# Patient Record
Sex: Female | Born: 1965 | Race: White | Hispanic: No | Smoking: Former smoker
Health system: Southern US, Community
[De-identification: ages and names within clinical notes are randomized; demographics above are authoritative.]

## PROBLEM LIST (undated history)

## (undated) DIAGNOSIS — J189 Pneumonia, unspecified organism: Secondary | ICD-10-CM

## (undated) DIAGNOSIS — F909 Attention-deficit hyperactivity disorder, unspecified type: Secondary | ICD-10-CM

## (undated) DIAGNOSIS — I739 Peripheral vascular disease, unspecified: Secondary | ICD-10-CM

## (undated) DIAGNOSIS — F319 Bipolar disorder, unspecified: Secondary | ICD-10-CM

## (undated) DIAGNOSIS — C801 Malignant (primary) neoplasm, unspecified: Secondary | ICD-10-CM

## (undated) DIAGNOSIS — I509 Heart failure, unspecified: Secondary | ICD-10-CM

## (undated) DIAGNOSIS — J45909 Unspecified asthma, uncomplicated: Secondary | ICD-10-CM

## (undated) DIAGNOSIS — K219 Gastro-esophageal reflux disease without esophagitis: Secondary | ICD-10-CM

## (undated) DIAGNOSIS — Z9981 Dependence on supplemental oxygen: Secondary | ICD-10-CM

## (undated) DIAGNOSIS — I10 Essential (primary) hypertension: Secondary | ICD-10-CM

## (undated) DIAGNOSIS — F32A Depression, unspecified: Secondary | ICD-10-CM

## (undated) DIAGNOSIS — M1990 Unspecified osteoarthritis, unspecified site: Secondary | ICD-10-CM

## (undated) DIAGNOSIS — I639 Cerebral infarction, unspecified: Secondary | ICD-10-CM

## (undated) DIAGNOSIS — R0600 Dyspnea, unspecified: Secondary | ICD-10-CM

## (undated) DIAGNOSIS — F419 Anxiety disorder, unspecified: Secondary | ICD-10-CM

## (undated) DIAGNOSIS — I2510 Atherosclerotic heart disease of native coronary artery without angina pectoris: Secondary | ICD-10-CM

## (undated) DIAGNOSIS — J449 Chronic obstructive pulmonary disease, unspecified: Secondary | ICD-10-CM

## (undated) HISTORY — PX: ROTATOR CUFF REPAIR: SHX139

## (undated) HISTORY — PX: CERVICAL ABLATION: SHX5771

## (undated) HISTORY — PX: BILATERAL CARPAL TUNNEL RELEASE: SHX6508

---

## 2017-04-05 ENCOUNTER — Ambulatory Visit (INDEPENDENT_AMBULATORY_CARE_PROVIDER_SITE_OTHER): Payer: Self-pay | Admitting: Otolaryngology

## 2017-04-05 DIAGNOSIS — R221 Localized swelling, mass and lump, neck: Secondary | ICD-10-CM

## 2017-04-05 DIAGNOSIS — D487 Neoplasm of uncertain behavior of other specified sites: Secondary | ICD-10-CM

## 2017-04-06 ENCOUNTER — Other Ambulatory Visit (INDEPENDENT_AMBULATORY_CARE_PROVIDER_SITE_OTHER): Payer: Self-pay | Admitting: Otolaryngology

## 2017-04-06 DIAGNOSIS — R221 Localized swelling, mass and lump, neck: Secondary | ICD-10-CM

## 2017-04-19 ENCOUNTER — Ambulatory Visit (HOSPITAL_COMMUNITY)
Admission: RE | Admit: 2017-04-19 | Discharge: 2017-04-19 | Disposition: A | Discharge status: Home / Self Care | Payer: Self-pay | Source: Ambulatory Visit | Attending: Otolaryngology | Admitting: Otolaryngology

## 2017-04-19 ENCOUNTER — Encounter (HOSPITAL_COMMUNITY): Payer: Self-pay | Admitting: Radiology

## 2017-04-19 DIAGNOSIS — R590 Localized enlarged lymph nodes: Secondary | ICD-10-CM | POA: Insufficient documentation

## 2017-04-19 DIAGNOSIS — M799 Soft tissue disorder, unspecified: Secondary | ICD-10-CM | POA: Insufficient documentation

## 2017-04-19 DIAGNOSIS — R221 Localized swelling, mass and lump, neck: Secondary | ICD-10-CM

## 2017-04-19 DIAGNOSIS — J432 Centrilobular emphysema: Secondary | ICD-10-CM | POA: Insufficient documentation

## 2017-04-19 MED ORDER — IOPAMIDOL (ISOVUE-300) INJECTION 61%
75.0000 mL | Freq: Once | INTRAVENOUS | Status: AC | PRN
  Administered 2017-04-19: 75 mL via INTRAVENOUS

## 2017-04-27 ENCOUNTER — Other Ambulatory Visit (INDEPENDENT_AMBULATORY_CARE_PROVIDER_SITE_OTHER): Payer: Self-pay | Admitting: Otolaryngology

## 2017-04-27 DIAGNOSIS — R590 Localized enlarged lymph nodes: Secondary | ICD-10-CM

## 2017-05-02 ENCOUNTER — Other Ambulatory Visit: Payer: Self-pay | Admitting: Radiology

## 2017-05-03 ENCOUNTER — Ambulatory Visit (HOSPITAL_COMMUNITY)
Admission: RE | Admit: 2017-05-03 | Discharge: 2017-05-03 | Disposition: A | Discharge status: Home / Self Care | Payer: Self-pay | Source: Ambulatory Visit | Attending: Otolaryngology | Admitting: Otolaryngology

## 2017-05-03 DIAGNOSIS — R590 Localized enlarged lymph nodes: Secondary | ICD-10-CM | POA: Insufficient documentation

## 2017-05-03 DIAGNOSIS — J439 Emphysema, unspecified: Secondary | ICD-10-CM | POA: Insufficient documentation

## 2017-05-03 DIAGNOSIS — Z880 Allergy status to penicillin: Secondary | ICD-10-CM | POA: Insufficient documentation

## 2017-05-03 DIAGNOSIS — Z888 Allergy status to other drugs, medicaments and biological substances status: Secondary | ICD-10-CM | POA: Insufficient documentation

## 2017-05-03 DIAGNOSIS — Z8541 Personal history of malignant neoplasm of cervix uteri: Secondary | ICD-10-CM | POA: Insufficient documentation

## 2017-05-03 DIAGNOSIS — F17200 Nicotine dependence, unspecified, uncomplicated: Secondary | ICD-10-CM | POA: Insufficient documentation

## 2017-05-03 LAB — CBC
HEMATOCRIT: 46.3 % — AB (ref 36.0–46.0)
HEMOGLOBIN: 16.8 g/dL — AB (ref 12.0–15.0)
MCH: 33.1 pg (ref 26.0–34.0)
MCHC: 36.3 g/dL — AB (ref 30.0–36.0)
MCV: 91.1 fL (ref 78.0–100.0)
Platelets: 207 10*3/uL (ref 150–400)
RBC: 5.08 MIL/uL (ref 3.87–5.11)
RDW: 13 % (ref 11.5–15.5)
WBC: 5.9 10*3/uL (ref 4.0–10.5)

## 2017-05-03 LAB — PROTIME-INR
INR: 0.91
Prothrombin Time: 12.2 seconds (ref 11.4–15.2)

## 2017-05-03 MED ORDER — LIDOCAINE HCL (PF) 1 % IJ SOLN
INTRAMUSCULAR | Status: AC
  Filled 2017-05-03: qty 10

## 2017-05-03 MED ORDER — MIDAZOLAM HCL 2 MG/2ML IJ SOLN
INTRAMUSCULAR | Status: AC
  Filled 2017-05-03: qty 4

## 2017-05-03 MED ORDER — SODIUM CHLORIDE 0.9 % IV SOLN
INTRAVENOUS | Status: AC | PRN
  Administered 2017-05-03: 10 mL/h via INTRAVENOUS

## 2017-05-03 MED ORDER — FENTANYL CITRATE (PF) 100 MCG/2ML IJ SOLN
INTRAMUSCULAR | Status: AC
  Filled 2017-05-03: qty 4

## 2017-05-03 MED ORDER — SODIUM CHLORIDE 0.9 % IV SOLN: INTRAVENOUS | Status: DC

## 2017-05-03 NOTE — Discharge Instructions (Signed)

## 2017-05-03 NOTE — Sedation Documentation (Signed)
Pt heart rate showing ventricular bigeminy. PA and Dr Anselm Pancoast elected to proceed w/o sedation. Writer will continue to monitor closely.

## 2017-05-03 NOTE — Progress Notes (Signed)
Phone call to pt regarding care instructions for her biopsy site. Pt verbalized understanding of instructions.

## 2017-05-03 NOTE — Procedures (Addendum)
US guided core biopsies and FNAs from left upper neck lesion/lymph nodes.  Lesion is poorly defined and there appears to be necrotic nodes in this area.  Minimal blood loss and no immediate complication.     Patient continued to have occasional PVCs during procedure but she is completely asymptomatic.  Patient's PCP was notified and follow-up appointment has been scheduled.

## 2017-05-03 NOTE — Progress Notes (Signed)
At present, cardiac monitoring showing no current PVCs.  She remains asymptomatic and feels stable for discharge home.  After discussion with Dr. Anselm Pancoast and Dr. Bartolo Darter EKG cancelled.   Patient will follow-up with PCP on Monday.   Brynda Greathouse, MS RD PA-C 4:08 PM

## 2017-05-03 NOTE — Sedation Documentation (Addendum)
Addendum to previous note/ per physician, pt d/c from [place]. Brother in waiting area, home with family. Writer wheeled pt to main entrance in chair.

## 2017-05-03 NOTE — Progress Notes (Signed)
Called PCP office- [facility]. Spoke with Dr. Bartolo Darter who confirms patient without known history of irregular rhythm.  Made appointment for patient to be evaluated at their office Monday 05/07/17 at 1130.   Patient informed.   Brynda Greathouse, MS RD PA-C 3:37 PM

## 2017-05-03 NOTE — H&P (Signed)
Chief Complaint: Patient was seen in consultation today for lymphadenopathy  Referring Physician(s): Teoh,Su  Supervising Physician: Markus Daft  Patient Status: [facility] - Out-pt  History of Present Illness: Stephanie Ross is a 52 y.o. female with remote history of cervical cancer, emphysema, and current smoker who presents with neck mass.   CT Soft Tissue Neck 04/19/17 showed: 1. The dominant and most superficial soft tissue mass corresponding to the palpable area of concern is a partially cystic lesion measuring up to 20 mm long axis which is inseparable from the anterior inferior left parotid space, but also located near the left level IIa nodal station, and abutting the lateral left submandibular gland. There is regional soft tissue stranding suggesting a degree of associated inflammation. There are multiple regional abnormal lymph nodes, which are asymmetrically enlarged and indistinct with spiculated margins. The largest are 12 millimeters short axis. There is no laryngeal or pharyngeal primary lesion identified. 2. The somewhat wide ranging regional lymph node involvement does raise the possibility of an infection, but overall the constellation is more suspicious for malignancy. Top differential considerations include a primary salivary gland carcinoma arising along the margin of the parotid space, metastatic lymphadenopathy from a regional skin cancer or an undetected pharyngeal primary. Histologic correlation will be necessary and the abnormality might be amenable to imaging guided needle biopsy. 3.  Emphysema (ICD10-J43.9).  IR consulted for lymph node biopsy at the request of Dr. Benjamine Mola.  Case reviewed by Dr. Pascal Lux who approves patient for procedure.  She presents today in her usual state of health. She does complain of ongoing unintentional weight loss, occasional nausea/vomiting, and shoulder pain related to rotator cuff injury.  She has been NPO.  She does not  take blood thinners.   Allergies: Amoxicillin and Prednisone  Medications: Prior to Admission medications   Medication Sig Start Date End Date Taking? Authorizing Provider  acetaminophen (TYLENOL) 500 MG tablet Take 1,000-1,500 mg by mouth every 6 (six) hours as needed for mild pain or moderate pain.   Yes [provider]  budesonide-formoterol (SYMBICORT) 160-4.5 MCG/ACT inhaler Inhale 2 puffs into the lungs 2 (two) times daily.   Yes [provider]  ibuprofen (ADVIL,MOTRIN) 200 MG tablet Take 800 mg by mouth every 6 (six) hours as needed for mild pain or moderate pain.   Yes [provider]  oxyCODONE-acetaminophen (PERCOCET/ROXICET) 5-325 MG tablet Take 1 tablet by mouth every 6 (six) hours.   Yes [provider]     No family history on file.  Social History   Socioeconomic History  . Marital status: Divorced    Spouse name: Not on file  . Number of children: Not on file  . Years of education: Not on file  . Highest education level: Not on file  Social Needs  . Financial resource strain: Not on file  . Food insecurity - worry: Not on file  . Food insecurity - inability: Not on file  . Transportation needs - medical: Not on file  . Transportation needs - non-medical: Not on file  Occupational History  . Not on file  Tobacco Use  . Smoking status: Not on file  Substance and Sexual Activity  . Alcohol use: Not on file  . Drug use: Not on file  . Sexual activity: Not on file  Other Topics Concern  . Not on file  Social History Narrative  . Not on file    Review of Systems  Constitutional: Negative for fatigue and fever.  Respiratory: Positive for cough (chronic). Negative for shortness of breath.   Cardiovascular: Negative for chest pain.  Gastrointestinal: Positive for nausea and vomiting (several times in past few months). Negative for abdominal pain.  Musculoskeletal: Negative for back pain.  Psychiatric/Behavioral: Negative  for behavioral problems and confusion.    Vital Signs: BP 119/85   Pulse 93   Temp 97.6 F (36.4 C) (Oral)   Ht 5\' 2"  (1.575 m)   Wt 138 lb (62.6 kg)   SpO2 100%   BMI 25.24 kg/m   Physical Exam  Constitutional: She appears well-developed.  Neck: Normal range of motion.  Cardiovascular: Normal rate, regular rhythm and normal heart sounds.  Pulmonary/Chest: Effort normal. No respiratory distress.  Coarse breath sounds  Abdominal: Soft. Bowel sounds are normal.  Skin: Skin is warm and dry.  Psychiatric: She has a normal mood and affect. Her behavior is normal. Judgment and thought content normal.  Nursing note and vitals reviewed.   Imaging: Ct Soft Tissue Neck W Contrast  Result Date: 04/19/2017 CLINICAL DATA:  52 year old female with 2 painful palpable areas along the side of the neck 1st discovered 2 months ago. Remote history of cervical cancer (1990s). EXAM: CT NECK WITH CONTRAST TECHNIQUE: Multidetector CT imaging of the neck was performed using the standard protocol following the bolus administration of intravenous contrast. CONTRAST:  49mL ISOVUE-300 IOPAMIDOL (ISOVUE-300) INJECTION 61% COMPARISON:  None. FINDINGS: Pharynx and larynx: The glottis is closed. Laryngeal soft tissue contours appear within normal limits. Pharyngeal soft tissue contours are within normal limits. No suspicious pharyngeal or laryngeal hyperenhancement identified. Parapharyngeal and retropharyngeal spaces appear normal. Salivary glands: Negative sublingual space. The right submandibular glands and parotid glands appear normal. Situated between the anterior inferior tip of the right parotid space and the posterolateral margin of the right submandibular gland is a partially cystic or necrotic lobulated soft tissue mass corresponding to the marked area of palpable concern. This encompasses 20 x 16 x 17 millimeters (AP by transverse by CC) but has indistinct margins. It is unclear whether this is arising from  the left parotid gland, but it is inseparable from the anterior left parotid space. The lesion appears more separate from the submandibular gland. There are multiple surrounding asymmetrically enlarged lymph nodes, detailed below. There is mild associated inflammatory appearing thickening of the left platysma in the submandibular region. The remainder of the left parotid space and left parotid gland are normal. The left submandibular gland appears mildly lobulated but with no abnormal enhancement. The left stylomastoid foramen has a normal CT appearance. Thyroid: Negative. Lymph nodes: Multiple enlarged and indistinct lymph nodes at the left level 1B, left level IIa, and level IIIa nodal stations. These nodes individually measure up to 12 millimeters short axis, and are clustered about the partially cystic palpable mass described above. See series 2 images 42 through 45. There are even small but asymmetrically enlarged left level 2B versus level 5 nodes on series 2, image 22 and 32, which have a similar indistinct appearance. These are up to 5 millimeters individually. The contralateral right side cervical nodes are within normal limits. The largest right level IIa nodes are 7 millimeters short axis. Vascular: Major vascular structures in the neck and at the skull base are patent, including the left IJ. Limited intracranial: Negative. Visualized orbits: Negative. Mastoids and visualized paranasal sinuses: Clear aside from mild bubbly opacity in the non dominant right sphenoid sinus. Skeleton: Intact mandible. No acute osseous abnormality identified. Chronic C5-C6 disc and endplate degeneration.  Upper chest: Centrilobular emphysema. No upper lobe lung nodule. Negative visualized superior mediastinum. Negative visible axillary lymph nodes. IMPRESSION: 1. The dominant and most superficial soft tissue mass corresponding to the palpable area of concern is a partially cystic lesion measuring up to 20 mm long axis which is  inseparable from the anterior inferior left parotid space, but also located near the left level IIa nodal station, and abutting the lateral left submandibular gland. There is regional soft tissue stranding suggesting a degree of associated inflammation. There are multiple regional abnormal lymph nodes, which are asymmetrically enlarged and indistinct with spiculated margins. The largest are 12 millimeters short axis. There is no laryngeal or pharyngeal primary lesion identified. 2. The somewhat wide ranging regional lymph node involvement does raise the possibility of an infection, but overall the constellation is more suspicious for malignancy. Top differential considerations include a primary salivary gland carcinoma arising along the margin of the parotid space, metastatic lymphadenopathy from a regional skin cancer or an undetected pharyngeal primary. Histologic correlation will be necessary and the abnormality might be amenable to imaging guided needle biopsy. 3.  Emphysema (ICD10-J43.9). Electronically Signed   By: Genevie Ann M.D.   On: 04/19/2017 14:52    Labs:  CBC: Recent Labs    05/03/17 1137  WBC 5.9  HGB 16.8*  HCT 46.3*  PLT 207    COAGS: Recent Labs    05/03/17 1137  INR 0.91    BMP: No results for input(s): NA, K, CL, CO2, GLUCOSE, BUN, CALCIUM, CREATININE, GFRNONAA, GFRAA in the last 8760 hours.  Invalid input(s): CMP  LIVER FUNCTION TESTS: No results for input(s): BILITOT, AST, ALT, ALKPHOS, PROT, ALBUMIN in the last 8760 hours.  TUMOR MARKERS: No results for input(s): AFPTM, CEA, CA199, CHROMGRNA in the last 8760 hours.  Assessment and Plan: Patient with past medical history of empysema, remote cervical cancer presents with complaint of cervical lymphadenopathy.  IR consulted for lymph node biopsy at the request of Dr. Benjamine Mola. Case reviewed by Dr. Anselm Pancoast who approves patient for procedure.  Patient presents today in their usual state of health.  She has been NPO and  is not currently on blood thinners.  Risks and benefits discussed with the patient including, but not limited to bleeding, infection, damage to adjacent structures or low yield requiring additional tests.  All of the patient's questions were answered, patient is agreeable to proceed. Consent signed and in chart.  Thank you for this interesting consult.  I greatly enjoyed meeting Stephanie Ross and look forward to participating in their care.  A copy of this report was sent to the requesting provider on this date.  Electronically Signed: Docia Barrier, PA 05/03/2017, 12:38 PM   I spent a total of  30 Minutes   in face to face in clinical consultation, greater than 50% of which was counseling/coordinating care for lymphadenopathy

## 2017-05-07 ENCOUNTER — Emergency Department (HOSPITAL_COMMUNITY)
Admission: EM | Admit: 2017-05-07 | Discharge: 2017-05-07 | Disposition: A | Discharge status: Home / Self Care | Payer: Self-pay | Attending: Emergency Medicine | Admitting: Emergency Medicine

## 2017-05-07 ENCOUNTER — Emergency Department (HOSPITAL_COMMUNITY): Payer: Self-pay

## 2017-05-07 ENCOUNTER — Encounter (HOSPITAL_COMMUNITY): Payer: Self-pay | Admitting: *Deleted

## 2017-05-07 ENCOUNTER — Other Ambulatory Visit: Payer: Self-pay

## 2017-05-07 DIAGNOSIS — J690 Pneumonitis due to inhalation of food and vomit: Secondary | ICD-10-CM | POA: Insufficient documentation

## 2017-05-07 DIAGNOSIS — F17210 Nicotine dependence, cigarettes, uncomplicated: Secondary | ICD-10-CM | POA: Insufficient documentation

## 2017-05-07 DIAGNOSIS — I10 Essential (primary) hypertension: Secondary | ICD-10-CM | POA: Insufficient documentation

## 2017-05-07 DIAGNOSIS — Z859 Personal history of malignant neoplasm, unspecified: Secondary | ICD-10-CM | POA: Insufficient documentation

## 2017-05-07 DIAGNOSIS — Z79899 Other long term (current) drug therapy: Secondary | ICD-10-CM | POA: Insufficient documentation

## 2017-05-07 DIAGNOSIS — J441 Chronic obstructive pulmonary disease with (acute) exacerbation: Secondary | ICD-10-CM

## 2017-05-07 HISTORY — DX: Malignant (primary) neoplasm, unspecified: C80.1

## 2017-05-07 HISTORY — DX: Essential (primary) hypertension: I10

## 2017-05-07 HISTORY — DX: Unspecified asthma, uncomplicated: J45.909

## 2017-05-07 LAB — BASIC METABOLIC PANEL
Anion gap: 11 (ref 5–15)
BUN: 12 mg/dL (ref 6–20)
CHLORIDE: 96 mmol/L — AB (ref 101–111)
CO2: 29 mmol/L (ref 22–32)
Calcium: 9.3 mg/dL (ref 8.9–10.3)
Creatinine, Ser: 0.81 mg/dL (ref 0.44–1.00)
GFR calc Af Amer: >60 mL/min (ref >60)
GFR calc non Af Amer: >60 mL/min (ref >60)
GLUCOSE: 119 mg/dL — AB (ref 65–99)
POTASSIUM: 3.7 mmol/L (ref 3.5–5.1)
Sodium: 136 mmol/L (ref 135–145)

## 2017-05-07 LAB — CBC WITH DIFFERENTIAL/PLATELET
Basophils Absolute: 0 10*3/uL (ref 0.0–0.1)
Basophils Relative: 0 %
Eosinophils Absolute: 0.1 10*3/uL (ref 0.0–0.7)
Eosinophils Relative: 1 %
HCT: 48 % — ABNORMAL HIGH (ref 36.0–46.0)
HEMOGLOBIN: 16.8 g/dL — AB (ref 12.0–15.0)
LYMPHS ABS: 2 10*3/uL (ref 0.7–4.0)
LYMPHS PCT: 39 %
MCH: 32.2 pg (ref 26.0–34.0)
MCHC: 35 g/dL (ref 30.0–36.0)
MCV: 92.1 fL (ref 78.0–100.0)
Monocytes Absolute: 0.8 10*3/uL (ref 0.1–1.0)
Monocytes Relative: 14 %
NEUTROS PCT: 46 %
Neutro Abs: 2.4 10*3/uL (ref 1.7–7.7)
Platelets: 201 10*3/uL (ref 150–400)
RBC: 5.21 MIL/uL — AB (ref 3.87–5.11)
RDW: 12.7 % (ref 11.5–15.5)
WBC: 5.3 10*3/uL (ref 4.0–10.5)

## 2017-05-07 LAB — BRAIN NATRIURETIC PEPTIDE: B Natriuretic Peptide: 123 pg/mL — ABNORMAL HIGH (ref 0.0–100.0)

## 2017-05-07 LAB — TROPONIN I

## 2017-05-07 LAB — D-DIMER, QUANTITATIVE: D-Dimer, Quant: 0.54 ug/mL-FEU — ABNORMAL HIGH (ref 0.00–0.50)

## 2017-05-07 MED ORDER — DOXYCYCLINE HYCLATE 100 MG PO CAPS: 100.0000 mg | ORAL_CAPSULE | Freq: Two times a day (BID) | ORAL | 0 refills | Status: DC

## 2017-05-07 MED ORDER — IOPAMIDOL (ISOVUE-370) INJECTION 76%
100.0000 mL | Freq: Once | INTRAVENOUS | Status: AC | PRN
  Administered 2017-05-07: 100 mL via INTRAVENOUS

## 2017-05-07 MED ORDER — ALBUTEROL SULFATE HFA 108 (90 BASE) MCG/ACT IN AERS: 2.0000 | INHALATION_SPRAY | RESPIRATORY_TRACT | 3 refills | Status: DC | PRN

## 2017-05-07 MED ORDER — ALBUTEROL SULFATE (2.5 MG/3ML) 0.083% IN NEBU: 5.0000 mg | INHALATION_SOLUTION | Freq: Once | RESPIRATORY_TRACT | Status: DC

## 2017-05-07 NOTE — Discharge Instructions (Signed)
Your CAT scan shows that you have some fluid in 1 of your airways.  Please take doxycycline twice a day for 10 days as well as albuterol every 4 hours    ER for worsening symptoms including shortness of breath, cough, fevers or chest pain  NO BLOOD CLOTS!

## 2017-05-07 NOTE — ED Notes (Signed)
Patient given discharge instruction, verbalized understand. Patient ambulatory out of the department.  

## 2017-05-07 NOTE — ED Provider Notes (Signed)
[facility] EMERGENCY DEPARTMENT Provider Note   CSN: [id number] Arrival date & time: 05/07/17  1355     History   Chief Complaint Chief Complaint  Patient presents with  . Shortness of Breath    HPI Stephanie Ross is a 52 y.o. female.  HPI  The patient is a 52 year old female, she has a known history of asthma, high blood pressure, who has had several months of worsening swelling on the left side of her jaw just inferior to the angle of the mandible.  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  A biopsy was performed 4 days ago and the results of that are not yet known.  The patient does report chronic asthma and as such has had some chronic shortness of breath but over the last week she has had increasing shortness of breath.  She reports that this is associated with wheezing, she does not feel like it is her neck that is making her short of breath but her chest.  She was seen at her family doctor's office today and they sent her to the emergency department with concern for pulmonary embolism.  Her shortness of breath is persistent, it is not worse with position or exertion, it is not associated with fevers objectively though she has had some subjective fevers, it is not associated with swelling of the legs.  She has never had a blood clot in the past, she has never had a heart attack, she has been using her albuterol inhalers with some improvement at home.  Past Medical History:  Diagnosis Date  . Asthma   . Cancer ([place])   . Hypertension     There are no active problems to display for this patient.   Past Surgical History:  Procedure Laterality Date  . BILATERAL CARPAL TUNNEL RELEASE    . CERVICAL ABLATION      OB History    No data available       Home Medications    Prior to Admission medications     Medication Sig Start Date End Date Taking? Authorizing Provider  acetaminophen (TYLENOL) 500 MG tablet Take 1,000-1,500 mg by mouth every 6 (six) hours as needed for mild pain or moderate pain.    [provider]  albuterol (PROVENTIL HFA;VENTOLIN HFA) 108 (90 Base) MCG/ACT inhaler Inhale 2 puffs into the lungs every 4 (four) hours as needed for wheezing or shortness of breath. 05/07/17   Noemi Chapel, MD  budesonide-formoterol [facility]) 160-4.5 MCG/ACT inhaler Inhale 2 puffs into the lungs 2 (two) times daily.    [provider]  doxycycline (VIBRAMYCIN) 100 MG capsule Take 1 capsule (100 mg total) by mouth 2 (two) times daily. 05/07/17   Noemi Chapel, MD  ibuprofen (ADVIL,MOTRIN) 200 MG tablet Take 800 mg by mouth every 6 (six) hours as needed for mild pain or moderate pain.    [provider]  oxyCODONE-acetaminophen (PERCOCET/ROXICET) 5-325 MG tablet Take 1 tablet by mouth every 6 (six) hours.    [provider]    Family History No family history on file.  Social History Social History   Tobacco Use  . Smoking status: Current Every Day Smoker    Packs/day: 0.50    Types: Cigarettes  . Smokeless tobacco: Never Used  Substance Use Topics  . Alcohol use: No    Frequency:  Never  . Drug use: No     Allergies   Amoxicillin and Prednisone   Review of Systems Review of Systems  All other systems reviewed and are negative.    Physical Exam Updated Vital Signs BP 128/68   Pulse 73   Temp 98 F (36.7 C) (Oral)   Resp 17   Ht 5\' 2"  (1.575 m)   Wt 59.4 kg (131 lb)   SpO2 95%   BMI 23.96 kg/m   Physical Exam  Constitutional: She appears well-developed and well-nourished. No distress.  HENT:  Head: Normocephalic and atraumatic.  Mouth/Throat: Oropharynx is clear and moist. No oropharyngeal exudate.  The patient is able to open and close her mouth, there is some tenderness at the left angle of the mandible with this movement and  there is a mass palpated in that region, it is tender to the touch but there is no overlying redness or warmth to the skin.  Her oral cavity is clear without any signs of asymmetry, no change in her phonation  Eyes: Conjunctivae and EOM are normal. Pupils are equal, round, and reactive to light. Right eye exhibits no discharge. Left eye exhibits no discharge. No scleral icterus.  Neck: Normal range of motion. Neck supple. No JVD present. No thyromegaly present.  The trachea is midline in the patient's neck is supple without trismus or torticollis  Cardiovascular: Normal rate, regular rhythm, normal heart sounds and intact distal pulses. Exam reveals no gallop and no friction rub.  No murmur heard. Pulmonary/Chest: Effort normal. No respiratory distress. She has wheezes ( Nonlabored breathing but has wheezes on expiration with a slight prolonged expiratory phase). She has no rales.  Abdominal: Soft. Bowel sounds are normal. She exhibits no distension and no mass. There is no tenderness.  Musculoskeletal: Normal range of motion. She exhibits no edema or tenderness.  Lymphadenopathy:    She has no cervical adenopathy.  Neurological: She is alert. Coordination normal.  Skin: Skin is warm and dry. No rash noted. No erythema.  Psychiatric: She has a normal mood and affect. Her behavior is normal.  Nursing note and vitals reviewed.    ED Treatments / Results  Labs (all labs ordered are listed, but only abnormal results are displayed) Labs Reviewed  CBC WITH DIFFERENTIAL/PLATELET - Abnormal; Notable for the following components:      Result Value   RBC 5.21 (*)    Hemoglobin 16.8 (*)    HCT 48.0 (*)    All other components within normal limits  BASIC METABOLIC PANEL - Abnormal; Notable for the following components:   Chloride 96 (*)    Glucose, Bld 119 (*)    All other components within normal limits  BRAIN NATRIURETIC PEPTIDE - Abnormal; Notable for the following components:   B Natriuretic  Peptide 123.0 (*)    All other components within normal limits  D-DIMER, QUANTITATIVE (NOT AT [facility]) - Abnormal; Notable for the following components:   D-Dimer, Quant 0.54 (*)    All other components within normal limits  TROPONIN I    EKG  EKG Interpretation  Date/Time:  Monday May 07 2017 14:00:12 EST Ventricular Rate:  83 PR Interval:  148 QRS Duration: 104 QT Interval:  394 QTC Calculation: 462 R Axis:   80 Text Interpretation:  Sinus rhythm with frequent Premature ventricular complexes Biatrial enlargement ST & T wave abnormality, consider inferior ischemia Prolonged QT Abnormal ECG PVC's, Non-specific ST-t changes No old tracing to compare Confirmed by Noemi Chapel [phone])  on 05/07/2017 3:22:28 PM       Radiology Dg Chest 2 View  Result Date: 05/07/2017 CLINICAL DATA:  52 year old female with a history of shortness of breath EXAM: CHEST  2 VIEW COMPARISON:  None. FINDINGS: Cardiomediastinal silhouette within normal limits. No evidence of central vascular congestion. No pneumothorax or pleural effusion. No confluent airspace disease. Coarsened interstitial markings on the anterior view. No displaced fracture. Degenerative changes of the acromioclavicular joints IMPRESSION: Likely chronic changes with no evidence of superimposed acute cardiopulmonary disease Electronically Signed   By: Corrie Mckusick D.O.   On: 05/07/2017 14:19   Ct Angio Chest Pe W And/or Wo Contrast  Result Date: 05/07/2017 CLINICAL DATA:  52 year old female with 2 weeks of shortness of breath. Cough. Smoker. EXAM: CT ANGIOGRAPHY CHEST WITH CONTRAST TECHNIQUE: Multidetector CT imaging of the chest was performed using the standard protocol during bolus administration of intravenous contrast. Multiplanar CT image reconstructions and MIPs were obtained to evaluate the vascular anatomy. CONTRAST:  169mL ISOVUE-370 IOPAMIDOL (ISOVUE-370) INJECTION 76% COMPARISON:  Chest radiographs 1401 hr today.  Neck CT  04/19/2017 FINDINGS: Cardiovascular: Good contrast bolus timing in the pulmonary arterial tree. No focal filling defect identified in the pulmonary arteries to suggest acute pulmonary embolism. Calcified left coronary artery atherosclerosis suspected on series 6, image 143. Soft and calcified plaque of the visible aorta, most pronounced in the descending and visible upper abdominal aorta. No cardiomegaly or pericardial effusion. Mediastinum/Nodes: Visible thoracic inlet is within normal limits. No mediastinal or hilar lymphadenopathy. Lungs/Pleura: Centrilobular emphysema. Small volume layering fluid in the right mainstem bronchus (series 7, image 73). Mild central peribronchial thickening. Occasional mild subpleural soft tissue opacity and nodularity (such as along the lateral left major fissure in the lingula and in the right posterior costophrenic angle on series 7, image 126) most compatible with scarring. No pleural effusion or other abnormal pulmonary opacity. Upper Abdomen: Negative visible liver, spleen, pancreas, adrenal glands, kidneys, and bowel in the upper abdomen. Musculoskeletal: No acute osseous abnormality identified. Review of the MIP images confirms the above findings. IMPRESSION: 1.  Negative for acute pulmonary embolus. 2. Emphysema (ICD10-J43.9). Small volume of fluid / secretions in the right mainstem bronchus, but no acute pulmonary process identified. 3. Aortic Atherosclerosis (ICD10-I70.0). Calcified coronary artery atherosclerosis. Electronically Signed   By: Genevie Ann M.D.   On: 05/07/2017 18:59    Procedures Procedures (including critical care time)  Medications Ordered in ED Medications  albuterol (PROVENTIL) (2.5 MG/3ML) 0.083% nebulizer solution 5 mg (not administered)  iopamidol (ISOVUE-370) 76 % injection 100 mL (100 mLs Intravenous Contrast Given 05/07/17 1838)     Initial Impression / Assessment and Plan / ED Course  I have reviewed the triage vital signs and the  nursing notes.  Pertinent labs & imaging results that were available during my care of the patient were reviewed by me and considered in my medical decision making (see chart for details).  Clinical Course as of May 07 1904  Mon May 07, 2017  1522 2 view PA and lateral chest reviewed, the patient is a smoker, she has lungs consistent with COPD, no acute infiltrates or pneumothorax.  [BM]    Clinical Course User Index [BM] Noemi Chapel, MD    I do not see any signs of acute infiltrate on the x-ray, I do not see any signs of PE including no tachycardia or hypoxia, she is wheezing all pointing to a reactive airway disease however given her recent history of possible malignancy with  a progressive shortness of breath will need to rule out pulmonary embolism.  D-dimer ordered.  CT scan does show some fluid in 1 of the mainstem bronchi, Patient will be treated with asthma treatments protocols or COPD protocol, will also give doxycycline, patient stable for discharge, expressed understanding  Final Clinical Impressions(s) / ED Diagnoses   Final diagnoses:  Aspiration pneumonitis ([place])  COPD exacerbation [facility])    ED Discharge Orders        Ordered    doxycycline (VIBRAMYCIN) 100 MG capsule  2 times daily     05/07/17 1905    albuterol (PROVENTIL HFA;VENTOLIN HFA) 108 (90 Base) MCG/ACT inhaler  Every 4 hours PRN     05/07/17 1905       Noemi Chapel, MD 05/07/17 1907

## 2017-05-07 NOTE — ED Triage Notes (Signed)
Patient came in per pcp, due to "irregular heart rate" for "6 hours", from neck biopsy done at [facility] on Thursday.  Feel short of breath constantly.

## 2017-05-31 ENCOUNTER — Ambulatory Visit (INDEPENDENT_AMBULATORY_CARE_PROVIDER_SITE_OTHER): Payer: Self-pay | Admitting: Otolaryngology

## 2017-05-31 DIAGNOSIS — R590 Localized enlarged lymph nodes: Secondary | ICD-10-CM

## 2017-05-31 DIAGNOSIS — D487 Neoplasm of uncertain behavior of other specified sites: Secondary | ICD-10-CM

## 2017-06-25 ENCOUNTER — Ambulatory Visit (INDEPENDENT_AMBULATORY_CARE_PROVIDER_SITE_OTHER): Payer: Self-pay | Admitting: Otolaryngology

## 2017-06-25 DIAGNOSIS — K1121 Acute sialoadenitis: Secondary | ICD-10-CM

## 2018-03-06 ENCOUNTER — Other Ambulatory Visit (HOSPITAL_COMMUNITY): Payer: Self-pay | Admitting: Respiratory Therapy

## 2018-03-06 DIAGNOSIS — J441 Chronic obstructive pulmonary disease with (acute) exacerbation: Secondary | ICD-10-CM

## 2018-04-03 ENCOUNTER — Ambulatory Visit (HOSPITAL_COMMUNITY)
Admission: RE | Admit: 2018-04-03 | Discharge: 2018-04-03 | Disposition: A | Discharge status: Home / Self Care | Payer: Medicaid Other | Source: Ambulatory Visit | Attending: Pulmonary Disease | Admitting: Pulmonary Disease

## 2018-04-03 DIAGNOSIS — J441 Chronic obstructive pulmonary disease with (acute) exacerbation: Secondary | ICD-10-CM

## 2018-04-03 LAB — PULMONARY FUNCTION TEST
DL/VA % PRED: 53 %
DL/VA: 2.47 ml/min/mmHg/L
DLCO UNC % PRED: 38 %
DLCO unc: 8.51 ml/min/mmHg
FEF 25-75 POST: 0.45 L/s
FEF 25-75 PRE: 0.33 L/s
FEF2575-%CHANGE-POST: 36 %
FEF2575-%PRED-POST: 17 %
FEF2575-%PRED-PRE: 13 %
FEV1-%Change-Post: 8 %
FEV1-%Pred-Post: 33 %
FEV1-%Pred-Pre: 30 %
FEV1-Post: 0.86 L
FEV1-Pre: 0.79 L
FEV1FVC-%CHANGE-POST: -9 %
FEV1FVC-%Pred-Pre: 65 %
FEV6-%CHANGE-POST: 17 %
FEV6-%Pred-Post: 53 %
FEV6-%Pred-Pre: 45 %
FEV6-PRE: 1.44 L
FEV6-Post: 1.69 L
FEV6FVC-%Change-Post: -2 %
FEV6FVC-%PRED-PRE: 99 %
FEV6FVC-%Pred-Post: 96 %
FVC-%CHANGE-POST: 20 %
FVC-%PRED-POST: 55 %
FVC-%Pred-Pre: 46 %
FVC-Post: 1.81 L
FVC-Pre: 1.51 L
POST FEV1/FVC RATIO: 47 %
PRE FEV1/FVC RATIO: 52 %
Post FEV6/FVC ratio: 93 %
Pre FEV6/FVC Ratio: 96 %
RV % pred: 205 %
RV: 3.69 L
TLC % pred: 113 %
TLC: 5.5 L

## 2018-04-03 MED ORDER — ALBUTEROL SULFATE (2.5 MG/3ML) 0.083% IN NEBU
2.5000 mg | INHALATION_SOLUTION | Freq: Once | RESPIRATORY_TRACT | Status: AC
  Administered 2018-04-03: 2.5 mg via RESPIRATORY_TRACT

## 2018-09-17 ENCOUNTER — Encounter: Payer: Self-pay | Admitting: *Deleted

## 2018-09-30 ENCOUNTER — Other Ambulatory Visit (HOSPITAL_COMMUNITY): Payer: Self-pay | Admitting: Internal Medicine

## 2018-09-30 DIAGNOSIS — Z1231 Encounter for screening mammogram for malignant neoplasm of breast: Secondary | ICD-10-CM

## 2018-10-14 ENCOUNTER — Ambulatory Visit (HOSPITAL_COMMUNITY): Payer: Medicaid Other

## 2018-10-14 ENCOUNTER — Ambulatory Visit (HOSPITAL_COMMUNITY)
Admission: RE | Admit: 2018-10-14 | Discharge: 2018-10-14 | Disposition: A | Discharge status: Home / Self Care | Payer: Medicaid Other | Source: Ambulatory Visit | Attending: Internal Medicine | Admitting: Internal Medicine

## 2018-10-14 ENCOUNTER — Encounter (HOSPITAL_COMMUNITY): Payer: Self-pay

## 2018-10-14 ENCOUNTER — Other Ambulatory Visit: Payer: Self-pay

## 2018-10-14 DIAGNOSIS — Z1231 Encounter for screening mammogram for malignant neoplasm of breast: Secondary | ICD-10-CM

## 2018-10-21 ENCOUNTER — Ambulatory Visit: Payer: Self-pay

## 2018-11-21 ENCOUNTER — Other Ambulatory Visit: Payer: Self-pay

## 2018-11-21 ENCOUNTER — Ambulatory Visit (INDEPENDENT_AMBULATORY_CARE_PROVIDER_SITE_OTHER): Payer: Medicaid Other | Admitting: *Deleted

## 2018-11-21 DIAGNOSIS — Z1211 Encounter for screening for malignant neoplasm of colon: Secondary | ICD-10-CM

## 2018-11-21 MED ORDER — NA SULFATE-K SULFATE-MG SULF 17.5-3.13-1.6 GM/177ML PO SOLN: 1.0000 | Freq: Once | ORAL | 0 refills | Status: AC

## 2018-11-21 NOTE — Patient Instructions (Signed)
Stephanie Ross  [birth year] MRN: [id number]     Procedure Date: 01/10/2019 Time to register: 12:00 pm Place to register: [facility] Procedure Time: 1:00 pm Scheduled provider: Dr. Oneida Alar    PREPARATION FOR COLONOSCOPY WITH SUPREP BOWEL PREP KIT  Note: Suprep Bowel Prep Kit is a split-dose (2day) regimen. Consumption of BOTH 6-ounce bottles is required for a complete prep.  Please notify us immediately if you are diabetic, take iron supplements, or if you are on Coumadin or any other blood thinners.                                                                                                                                                 1 DAY BEFORE PROCEDURE:  DATE: 01/09/2019   DAY: Thursday Continue clear liquids the entire day - NO SOLID FOOD.    At 6:00pm: Complete steps 1 through 4 below, using ONE (1) 6-ounce bottle, before going to bed. Step 1:  Pour ONE (1) 6-ounce bottle of SUPREP liquid into the mixing container.  Step 2:  Add cool drinking water to the 16 ounce line on the container and mix.  Note: Dilute the solution concentrate as directed prior to use. Step 3:  DRINK ALL the liquid in the container. Step 4:  You MUST drink an additional two (2) or more 16 ounce containers of water over the next one (1) hour.   Continue clear liquids.  DAY OF PROCEDURE:   DATE: 01/10/2019   DAY:  Friday If you take medications for your heart, blood pressure, or breathing, you may take these medications.    5 hours before your procedure at :  8:00 am Step 1:  Pour ONE (1) 6-ounce bottle of SUPREP liquid into the mixing container.  Step 2:  Add cool drinking water to the 16 ounce line on the container and mix.  Note: Dilute the solution concentrate as directed prior to use. Step 3:  DRINK ALL the liquid in the container. Step 4:  You MUST drink an additional two (2) or more 16 ounce containers of water over the next one (1) hour. You MUST complete the final glass  of water at least 3 hours before your colonoscopy. Nothing by mouth past 10:00 am  You may take your morning medications with sip of water unless we have instructed otherwise.    Please see below for Dietary Information.  CLEAR LIQUIDS INCLUDE:  Water Jello (NOT red in color)   Ice Popsicles (NOT red in color)   Tea (sugar ok, no milk/cream) Powdered fruit flavored drinks  Coffee (sugar ok, no milk/cream) Gatorade/ Lemonade/ Kool-Aid  (NOT red in color)   Juice: apple, white grape, white cranberry Soft drinks  Clear bullion, consomme, broth (fat free beef/chicken/vegetable)  Carbonated beverages (any kind)  Strained chicken noodle soup Hard Candy   Remember:  Clear liquids are liquids that will allow you to see your fingers on the other side of a clear glass. Be sure liquids are NOT red in color, and not cloudy, but CLEAR.  DO NOT EAT OR DRINK ANY OF THE FOLLOWING:  Dairy products of any kind   Cranberry juice Tomato juice / V8 juice   Grapefruit juice Orange juice     Red grape juice  Do not eat any solid foods, including such foods as: cereal, oatmeal, yogurt, fruits, vegetables, creamed soups, eggs, bread, crackers, pureed foods in a blender, etc.   HELPFUL HINTS FOR DRINKING PREP SOLUTION:   Make sure prep is extremely cold. Mix and refrigerate the the morning of the prep. You may also put in the freezer.   You may try mixing some Crystal Light or Country Time Lemonade if you prefer. Mix in small amounts; add more if necessary.  Try drinking through a straw  Rinse mouth with water or a mouthwash between glasses, to remove after-taste.  Try sipping on a cold beverage /ice/ popsicles between glasses of prep.  Place a piece of sugar-free hard candy in mouth between glasses.  If you become nauseated, try consuming smaller amounts, or stretch out the time between glasses. Stop for 30-60 minutes, then slowly start back drinking.     OTHER INSTRUCTIONS  You will need a  responsible adult at least 55 years of age to accompany you and drive you home. This person must remain in the waiting room during your procedure. The hospital will cancel your procedure if you do not have a responsible adult with you.   1. Wear loose fitting clothing that is easily removed. 2. Leave jewelry and other valuables at home.  3. Remove all body piercing jewelry and leave at home. 4. Total time from sign-in until discharge is approximately 2-3 hours. 5. You should go home directly after your procedure and rest. You can resume normal activities the day after your procedure. 6. The day of your procedure you should not:  Drive  Make legal decisions  Operate machinery  Drink alcohol  Return to work   You may call the office (Dept: [phone]) before 5:00pm, or page the doctor on call [phone]) after 5:00pm, for further instructions, if necessary.   Insurance Information YOU WILL NEED TO CHECK WITH YOUR INSURANCE COMPANY FOR THE BENEFITS OF COVERAGE YOU HAVE FOR THIS PROCEDURE.  UNFORTUNATELY, NOT ALL INSURANCE COMPANIES HAVE BENEFITS TO COVER ALL OR PART OF THESE TYPES OF PROCEDURES.  IT IS YOUR RESPONSIBILITY TO CHECK YOUR BENEFITS, HOWEVER, WE WILL BE GLAD TO ASSIST YOU WITH ANY CODES YOUR INSURANCE COMPANY MAY NEED.    PLEASE NOTE THAT MOST INSURANCE COMPANIES WILL NOT COVER A SCREENING COLONOSCOPY FOR PEOPLE UNDER THE AGE OF 50  IF YOU HAVE BCBS INSURANCE, YOU MAY HAVE BENEFITS FOR A SCREENING COLONOSCOPY BUT IF POLYPS ARE FOUND THE DIAGNOSIS WILL CHANGE AND THEN YOU MAY HAVE A DEDUCTIBLE THAT WILL NEED TO BE MET. SO PLEASE MAKE SURE YOU CHECK YOUR BENEFITS FOR A SCREENING COLONOSCOPY AS WELL AS A DIAGNOSTIC COLONOSCOPY.

## 2018-11-21 NOTE — Progress Notes (Signed)
Gastroenterology Pre-Procedure Review  Request Date: 11/21/2018 Requesting Physician: Dr. Yong Channel @ [facility], no previous TCS  PATIENT REVIEW QUESTIONS: The patient responded to the following health history questions as indicated:    1. Diabetes Melitis: No 2. Joint replacements in the past 12 months: No 3. Major health problems in the past 3 months: No 4. Has an artificial valve or MVP: No 5. Has a defibrillator: No 6. Has been advised in past to take antibiotics in advance of a procedure like teeth cleaning: No 7. Family history of colon cancer: No  8. Alcohol Use: No 9. History of sleep apnea: No 10. History of coronary artery or other vascular stents placed within the last 12 months: No 11. History of any prior anesthesia complications: No    MEDICATIONS & ALLERGIES:    Patient reports the following regarding taking any blood thinners:   Plavix? No Aspirin? No Coumadin? No Brilinta? No Xarelto? No Eliquis? No Pradaxa? No Savaysa? No Effient? No  Patient confirms/reports the following medications:  Current Outpatient Medications  Medication Sig Dispense Refill  . albuterol (PROVENTIL HFA;VENTOLIN HFA) 108 (90 Base) MCG/ACT inhaler Inhale 2 puffs into the lungs every 4 (four) hours as needed for wheezing or shortness of breath. 1 Inhaler 3  . amLODipine (NORVASC) 5 MG tablet Take 5 mg by mouth daily.    Marland Kitchen atorvastatin (LIPITOR) 20 MG tablet Take 20 mg by mouth daily.    . budesonide-formoterol (SYMBICORT) 160-4.5 MCG/ACT inhaler Inhale 2 puffs into the lungs 2 (two) times daily.    . celecoxib (CELEBREX) 200 MG capsule Take 200 mg by mouth daily.    . Coenzyme Q10 (COQ10) 200 MG CAPS Take by mouth daily.    . ergocalciferol (VITAMIN D2) 1.25 MG (50000 UT) capsule Take 50,000 Units by mouth once a week.    . fluticasone (FLONASE) 50 MCG/ACT nasal spray Place into both nostrils daily.    . naproxen (NAPROSYN) 375 MG tablet Take 375 mg by mouth as needed.      No current facility-administered medications for this visit.     Patient confirms/reports the following allergies:  Allergies  Allergen Reactions  . Amoxicillin Other (See Comments)    Has patient had a PCN reaction causing immediate rash, facial/tongue/throat swelling, SOB or lightheadedness with hypotension: Yes Has patient had a PCN reaction causing severe rash involving mucus membranes or skin necrosis: Yes Has patient had a PCN reaction that required hospitalization:yes Has patient had a PCN reaction occurring within the last 10 years: yes If all of the above answers are "NO", then may proceed with Cephalosporin use.   . Prednisone Other (See Comments)    Full body rash     No orders of the defined types were placed in this encounter.   AUTHORIZATION INFORMATION Primary Insurance: Medicaid [place],  ID #: [id number] L Pre-Cert / Josem Kaufmann required: No, not required   SCHEDULE INFORMATION: Procedure has been scheduled as follows:  Date: 01/10/2019, Time: 1:00  Location: [facility] with Dr. Oneida Alar  This Gastroenterology Pre-Precedure Review Form is being routed to the following provider(s): Neil Crouch, PA-C

## 2018-11-22 NOTE — Progress Notes (Signed)
Ok to schedule.

## 2018-11-25 NOTE — Addendum Note (Signed)
Addended by: Metro Kung on: 11/25/2018 11:24 AM   Modules accepted: Orders, SmartSet

## 2019-01-08 ENCOUNTER — Other Ambulatory Visit (HOSPITAL_COMMUNITY)
Admission: RE | Admit: 2019-01-08 | Discharge: 2019-01-08 | Disposition: A | Discharge status: Home / Self Care | Payer: Medicaid Other | Source: Ambulatory Visit | Attending: Gastroenterology | Admitting: Gastroenterology

## 2019-01-08 ENCOUNTER — Other Ambulatory Visit: Payer: Self-pay

## 2019-01-09 ENCOUNTER — Telehealth: Payer: Self-pay

## 2019-01-09 NOTE — Telephone Encounter (Signed)
Melanie at [facility] called office, pt was no show for COVID test yesterday. TCS scheduled for 01/10/19.

## 2019-01-09 NOTE — Telephone Encounter (Signed)
Called pt and she said that she was having transportation issues.  She is going to call me back to reschedule.  Melanie in [facility] notified.

## 2019-01-10 ENCOUNTER — Ambulatory Visit (HOSPITAL_COMMUNITY): Admit: 2019-01-10 | Payer: Medicaid Other | Admitting: Gastroenterology

## 2019-01-10 ENCOUNTER — Encounter (HOSPITAL_COMMUNITY): Payer: Self-pay

## 2019-01-10 SURGERY — COLONOSCOPY
Anesthesia: Moderate Sedation

## 2019-08-16 ENCOUNTER — Observation Stay
Admission: EM | Admit: 2019-08-16 | Discharge: 2019-08-18 | Disposition: A | Discharge status: Home Health | Payer: Medicaid Other | Attending: Internal Medicine | Admitting: Internal Medicine

## 2019-08-16 ENCOUNTER — Emergency Department: Payer: Medicaid Other

## 2019-08-16 ENCOUNTER — Encounter: Payer: Self-pay | Admitting: Emergency Medicine

## 2019-08-16 ENCOUNTER — Other Ambulatory Visit: Payer: Self-pay

## 2019-08-16 DIAGNOSIS — J341 Cyst and mucocele of nose and nasal sinus: Secondary | ICD-10-CM | POA: Insufficient documentation

## 2019-08-16 DIAGNOSIS — I110 Hypertensive heart disease with heart failure: Secondary | ICD-10-CM | POA: Diagnosis not present

## 2019-08-16 DIAGNOSIS — Z79899 Other long term (current) drug therapy: Secondary | ICD-10-CM | POA: Insufficient documentation

## 2019-08-16 DIAGNOSIS — Z7982 Long term (current) use of aspirin: Secondary | ICD-10-CM | POA: Insufficient documentation

## 2019-08-16 DIAGNOSIS — I639 Cerebral infarction, unspecified: Secondary | ICD-10-CM | POA: Diagnosis present

## 2019-08-16 DIAGNOSIS — Z888 Allergy status to other drugs, medicaments and biological substances status: Secondary | ICD-10-CM | POA: Diagnosis not present

## 2019-08-16 DIAGNOSIS — J449 Chronic obstructive pulmonary disease, unspecified: Secondary | ICD-10-CM | POA: Insufficient documentation

## 2019-08-16 DIAGNOSIS — I6389 Other cerebral infarction: Principal | ICD-10-CM | POA: Insufficient documentation

## 2019-08-16 DIAGNOSIS — D696 Thrombocytopenia, unspecified: Secondary | ICD-10-CM

## 2019-08-16 DIAGNOSIS — Z880 Allergy status to penicillin: Secondary | ICD-10-CM | POA: Insufficient documentation

## 2019-08-16 DIAGNOSIS — Z20822 Contact with and (suspected) exposure to covid-19: Secondary | ICD-10-CM | POA: Insufficient documentation

## 2019-08-16 DIAGNOSIS — J45909 Unspecified asthma, uncomplicated: Secondary | ICD-10-CM | POA: Insufficient documentation

## 2019-08-16 DIAGNOSIS — Z791 Long term (current) use of non-steroidal anti-inflammatories (NSAID): Secondary | ICD-10-CM | POA: Diagnosis not present

## 2019-08-16 DIAGNOSIS — Z7901 Long term (current) use of anticoagulants: Secondary | ICD-10-CM | POA: Diagnosis not present

## 2019-08-16 DIAGNOSIS — Z7902 Long term (current) use of antithrombotics/antiplatelets: Secondary | ICD-10-CM | POA: Diagnosis not present

## 2019-08-16 DIAGNOSIS — Z803 Family history of malignant neoplasm of breast: Secondary | ICD-10-CM | POA: Diagnosis not present

## 2019-08-16 DIAGNOSIS — I351 Nonrheumatic aortic (valve) insufficiency: Secondary | ICD-10-CM | POA: Diagnosis not present

## 2019-08-16 DIAGNOSIS — G8194 Hemiplegia, unspecified affecting left nondominant side: Secondary | ICD-10-CM | POA: Diagnosis not present

## 2019-08-16 DIAGNOSIS — Z859 Personal history of malignant neoplasm, unspecified: Secondary | ICD-10-CM | POA: Diagnosis not present

## 2019-08-16 DIAGNOSIS — Z87891 Personal history of nicotine dependence: Secondary | ICD-10-CM | POA: Insufficient documentation

## 2019-08-16 DIAGNOSIS — R200 Anesthesia of skin: Secondary | ICD-10-CM | POA: Insufficient documentation

## 2019-08-16 DIAGNOSIS — Z7951 Long term (current) use of inhaled steroids: Secondary | ICD-10-CM | POA: Diagnosis not present

## 2019-08-16 DIAGNOSIS — I5032 Chronic diastolic (congestive) heart failure: Secondary | ICD-10-CM | POA: Insufficient documentation

## 2019-08-16 HISTORY — DX: Heart failure, unspecified: I50.9

## 2019-08-16 HISTORY — DX: Chronic obstructive pulmonary disease, unspecified: J44.9

## 2019-08-16 LAB — CBC WITH DIFFERENTIAL/PLATELET
Abs Immature Granulocytes: 0.01 10*3/uL (ref 0.00–0.07)
Basophils Absolute: 0 10*3/uL (ref 0.0–0.1)
Basophils Relative: 0 %
Eosinophils Absolute: 0.1 10*3/uL (ref 0.0–0.5)
Eosinophils Relative: 2 %
HCT: 36 % (ref 36.0–46.0)
Hemoglobin: 13 g/dL (ref 12.0–15.0)
Immature Granulocytes: 0 %
Lymphocytes Relative: 48 %
Lymphs Abs: 3.7 10*3/uL (ref 0.7–4.0)
MCH: 29.2 pg (ref 26.0–34.0)
MCHC: 36.1 g/dL — ABNORMAL HIGH (ref 30.0–36.0)
MCV: 80.9 fL (ref 80.0–100.0)
Monocytes Absolute: 0.6 10*3/uL (ref 0.1–1.0)
Monocytes Relative: 8 %
Neutro Abs: 3.2 10*3/uL (ref 1.7–7.7)
Neutrophils Relative %: 42 %
Platelets: 94 10*3/uL — ABNORMAL LOW (ref 150–400)
RBC: 4.45 MIL/uL (ref 3.87–5.11)
RDW: 14.8 % (ref 11.5–15.5)
WBC: 7.7 10*3/uL (ref 4.0–10.5)
nRBC: 0 % (ref 0.0–0.2)

## 2019-08-16 LAB — BASIC METABOLIC PANEL
Anion gap: 8 (ref 5–15)
BUN: 13 mg/dL (ref 6–20)
CO2: 27 mmol/L (ref 22–32)
Calcium: 8.9 mg/dL (ref 8.9–10.3)
Chloride: 97 mmol/L — ABNORMAL LOW (ref 98–111)
Creatinine, Ser: 0.72 mg/dL (ref 0.44–1.00)
GFR calc Af Amer: >60 mL/min (ref >60)
GFR calc non Af Amer: >60 mL/min (ref >60)
Glucose, Bld: 114 mg/dL — ABNORMAL HIGH (ref 70–99)
Potassium: 3.7 mmol/L (ref 3.5–5.1)
Sodium: 132 mmol/L — ABNORMAL LOW (ref 135–145)

## 2019-08-16 NOTE — ED Notes (Signed)
Patient reports having felt strange all day.  Reports she feels back to her normal at this time.

## 2019-08-16 NOTE — ED Provider Notes (Signed)
[facility] Emergency Department Provider Note  ____________________________________________  Time seen: Approximately 11:34 PM  I have reviewed the triage vital signs and the nursing notes.   HISTORY  Chief Complaint Numbness   HPI Stephanie Ross is a 54 y.o. female with a history of smoking, asthma, COPD, hypertension, diastolic CHF who presents for evaluation of a headache.  Patient reports that she woke up this morning with a headache.  She describes a headache  severe, diffuse.  No neck stiffness, no fever, no sore throat, no chest pain or shortness of breath, no nausea, vomiting, photophobia or phonophobia.  Patient reports a prior history of migraine headaches but this 1 was much more severe.  She took over-the-counter Excedrin and the headache has now resolved.  She reports left-sided numbness associated with headache which has persisted.  This evening her son noted that her pupils were asymmetric which prompted the visit to the emergency room.  She has no headache at this time.  She does report slight gait instability because of the numbness and mild weakness on the left side.  She denies slurred speech or facial droop, changes in vision.  She denies any prior history of stroke.  Past Medical History:  Diagnosis Date  . Asthma   . Cancer ([place])   . CHF (congestive heart failure) ([place])   . COPD (chronic obstructive pulmonary disease) ([place])   . Hypertension     There are no problems to display for this patient.   Past Surgical History:  Procedure Laterality Date  . BILATERAL CARPAL TUNNEL RELEASE    . CERVICAL ABLATION    . ROTATOR CUFF REPAIR Right     Prior to Admission medications   Medication Sig Start Date End Date Taking? Authorizing Provider  albuterol (PROVENTIL HFA;VENTOLIN HFA) 108 (90 Base) MCG/ACT inhaler Inhale 2 puffs into the lungs every 4 (four) hours as needed for wheezing or shortness of breath. 05/07/17   Noemi Chapel, MD    amLODipine (NORVASC) 5 MG tablet Take 5 mg by mouth daily.    [provider]  atorvastatin (LIPITOR) 20 MG tablet Take 20 mg by mouth daily.    [provider]  budesonide-formoterol (SYMBICORT) 160-4.5 MCG/ACT inhaler Inhale 2 puffs into the lungs 2 (two) times daily.    [provider]  celecoxib (CELEBREX) 200 MG capsule Take 200 mg by mouth daily.    [provider]  Coenzyme Q10 (COQ10) 200 MG CAPS Take by mouth daily.    [provider]  ergocalciferol (VITAMIN D2) 1.25 MG (50000 UT) capsule Take 50,000 Units by mouth once a week.    [provider]  fluticasone (FLONASE) 50 MCG/ACT nasal spray Place into both nostrils daily.    [provider]  naproxen (NAPROSYN) 375 MG tablet Take 375 mg by mouth as needed.    [provider]    Allergies Amoxicillin and Prednisone  Family History  Problem Relation Age of Onset  . Breast cancer Sister   . Breast cancer Paternal Grandmother     Social History Social History   Tobacco Use  . Smoking status: Former Smoker    Packs/day: 0.25    Quit date: 07/17/2019    Years since quitting: 0.0  . Smokeless tobacco: Never Used  Substance Use Topics  . Alcohol use: No  . Drug use: Yes    Types: Marijuana    Comment: occasionally    Review of Systems  Constitutional: Negative for fever. Eyes:  Negative for visual changes. ENT: Negative for sore throat. Neck: No neck pain  Cardiovascular: Negative for chest pain. Respiratory: Negative for shortness of breath. Gastrointestinal: Negative for abdominal pain, vomiting or diarrhea. Genitourinary: Negative for dysuria. Musculoskeletal: Negative for back pain. Skin: Negative for rash. Neurological: + headaches and L sided weakness and numbness. Psych: No SI or HI  ____________________________________________   PHYSICAL EXAM:  VITAL SIGNS: ED Triage Vitals  Enc Vitals Group     BP 08/16/19 2226 127/61      Pulse Rate 08/16/19 2226 73     Resp 08/16/19 2226 18     Temp 08/16/19 2226 98.2 F (36.8 C)     Temp Source 08/16/19 2226 Oral     SpO2 08/16/19 2226 99 %     Weight 08/16/19 2228 165 lb (74.8 kg)     Height 08/16/19 2228 5\' 2"  (1.575 m)     Head Circumference --      Peak Flow --      Pain Score 08/16/19 2227 0     Pain Loc --      Pain Edu? --      Excl. in [place]? --     Constitutional: Alert and oriented. Well appearing and in no apparent distress. HEENT:      Head: Normocephalic and atraumatic.         Eyes: Conjunctivae are normal. Sclera is non-icteric.       Mouth/Throat: Mucous membranes are moist.       Neck: Supple with no signs of meningismus. Cardiovascular: Regular rate and rhythm. No murmurs, gallops, or rubs. 2+ symmetrical distal pulses are present in all extremities. No JVD. Respiratory: Normal respiratory effort. Lungs are clear to auscultation bilaterally. No wheezes, crackles, or rhonchi.  Gastrointestinal: Soft, non tender Musculoskeletal: Nontender with normal range of motion in all extremities. No edema, cyanosis, or erythema of extremities. Neurologic: Normal speech and language. Face is symmetric. EOMI, pupils are reactive and asymmetric, 30mm on the R and 35mm on the L, intact strength x4, intact sensation x4, no dysmetria, no pronator drift. Gait deferred Skin: Skin is warm, dry and intact. No rash noted. Psychiatric: Mood and affect are normal. Speech and behavior are normal.  ____________________________________________   LABS (all labs ordered are listed, but only abnormal results are displayed)  Labs Reviewed  CBC WITH DIFFERENTIAL/PLATELET - Abnormal; Notable for the following components:      Result Value   MCHC 36.1 (*)    Platelets 94 (*)    All other components within normal limits  BASIC METABOLIC PANEL - Abnormal; Notable for the following components:   Sodium 132 (*)    Chloride 97 (*)    Glucose, Bld 114 (*)    All other components  within normal limits  SARS CORONAVIRUS 2 BY RT PCR (HOSPITAL ORDER, [place] [facility])  URINE DRUG SCREEN, QUALITATIVE ([facility] ONLY)  URINALYSIS, ROUTINE W REFLEX MICROSCOPIC  POC URINE PREG, ED  TROPONIN I (HIGH SENSITIVITY)   ____________________________________________  EKG  ED ECG REPORT I, Rudene Re, the attending physician, personally viewed and interpreted this ECG.  Normal sinus rhythm, rate of 69, normal intervals, normal axis, no ST elevations or depressions. ____________________________________________  RADIOLOGY  I have personally reviewed the images performed during this visit and I agree with the Radiologist's read.   Interpretation by Radiologist:  CT Head Wo Contrast  Result Date: 08/16/2019 CLINICAL DATA:  Headache and left-sided numbness for 1 day EXAM: CT HEAD  WITHOUT CONTRAST TECHNIQUE: Contiguous axial images were obtained from the base of the skull through the vertex without intravenous contrast. COMPARISON:  None. FINDINGS: Brain: No acute infarct or hemorrhage. Lateral ventricles and midline structures are unremarkable. No acute extra-axial fluid collections. No mass effect. Vascular: No hyperdense vessel or unexpected calcification. Skull: Normal. Negative for fracture or focal lesion. Sinuses/Orbits: No acute finding. Other: None. IMPRESSION: Normal CT head without contrast. Electronically Signed   By: Randa Ngo M.D.   On: 08/16/2019 23:16   MR BRAIN WO CONTRAST  Result Date: 08/17/2019 CLINICAL DATA:  Initial evaluation for acute headache with left-sided numbness. EXAM: MRI HEAD WITHOUT CONTRAST TECHNIQUE: Multiplanar, multiecho pulse sequences of the brain and surrounding structures were obtained without intravenous contrast. COMPARISON:  Prior head CT from 08/16/2019. FINDINGS: Brain: Cerebral volume within normal limits for age. Minimal scattered cerebral white matter changes for age. Probable tiny remote right cerebellar  infarct noted (series 15, image 15). Punctate 3 mm focus of restricted diffusion seen involving the dorsal right medulla, consistent with a small acute ischemic infarct (series 5, image 9). No associated hemorrhage or mass effect. No other evidence for acute or subacute ischemia. Gray-white matter differentiation otherwise maintained. No encephalomalacia to suggest chronic cortical infarction elsewhere within the brain. No foci of susceptibility artifact to suggest acute or chronic intracranial hemorrhage. No mass lesion, midline shift or mass effect. No hydrocephalus or extra-axial fluid collection. Pituitary gland suprasellar region normal. Midline structures intact. Vascular: Major intracranial vascular flow voids are maintained. Skull and upper cervical spine: Craniocervical junction normal. Bone marrow signal intensity within normal limits. No scalp soft tissue abnormality. Sinuses/Orbits: Globes and orbital soft tissues within normal limits. Mild scattered mucosal thickening noted within the ethmoidal air cells and maxillary sinuses. Small right maxillary sinus retention cyst noted. No significant mastoid effusion. Inner ear structures normal. Other: None. IMPRESSION: 1. 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 2. 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 3. Minimal cerebral white matter changes for age, nonspecific, but most commonly related to chronic small vessel ischemic disease. Electronically Signed   By: Jeannine Boga M.D.   On: 08/17/2019 01:07     ____________________________________________   PROCEDURES  Procedure(s) performed:yes .1-3 Lead EKG Interpretation Performed by: Rudene Re, MD Authorized by: Rudene Re, MD     Interpretation: normal     ECG rate assessment: normal     Rhythm: sinus rhythm     Ectopy: none     Conduction: normal     Critical Care performed: yes  CRITICAL  CARE Performed by: Rudene Re  ?  Total critical care time: 35 min  Critical care time was exclusive of separately billable procedures and treating other patients.  Critical care was necessary to treat or prevent imminent or life-threatening deterioration.  Critical care was time spent personally by me on the following activities: development of treatment plan with patient and/or surrogate as well as nursing, discussions with consultants, evaluation of patient's response to treatment, examination of patient, obtaining history from patient or surrogate, ordering and performing treatments and interventions, ordering and review of laboratory studies, ordering and review of radiographic studies, pulse oximetry and re-evaluation of patient's condition.  ____________________________________________   INITIAL IMPRESSION / ASSESSMENT AND PLAN / ED COURSE  54 y.o. female with a history of smoking, asthma, COPD, hypertension, diastolic CHF who presents for evaluation of a headache and left-sided weakness and numbness since 10 AM this morning.  The headache  has now resolved with patient reports that her left side still feels "weird."  On exam she is well-appearing and in no distress, she has normal vital signs, she is not hypertensive, she has no meningeal signs, she does have asymmetric pupils which are both reactive however remaining of her neurological exam is normal with no weakness or numbness.  Differential diagnoses including stroke versus TIA versus subarachnoid hemorrhage versus complex migraine.  Old medical records reviewed.  Patient placed on telemetry for close monitoring.  Head CT negative for any acute abnormalities. Labs showing new thrombocytopenia with platelet counts of 94.  Remaining lines look normal.  We will get a MRI to rule out stroke.  If that is negative we will discuss further work-up for possible subarachnoid hemorrhage.   _________________________ 1:27 AM on  08/17/2019 -----------------------------------------  MRI consistent with an acute ischemic stroke in the dorsal right medulla. Aspirin has been ordered and will be given as soon as patient passed swallow evaluation. Will discuss with the hospitalist for admission. Daughter is at bedside who provides history as well.    _____________________________________________ Please note:  Patient was evaluated in Emergency Department today for the symptoms described in the history of present illness. Patient was evaluated in the context of the global COVID-19 pandemic, which necessitated consideration that the patient might be at risk for infection with the SARS-CoV-2 virus that causes COVID-19. Institutional protocols and algorithms that pertain to the evaluation of patients at risk for COVID-19 are in a state of rapid change based on information released by regulatory bodies including the CDC and federal and state organizations. These policies and algorithms were followed during the patient's care in the ED.  Some ED evaluations and interventions may be delayed as a result of limited staffing during the pandemic.   [place] Controlled Substance Database was reviewed by me. ____________________________________________   FINAL CLINICAL IMPRESSION(S) / ED DIAGNOSES   Final diagnoses:  Ischemic cerebrovascular accident (CVA) ([place])  Thrombocytopenia ([place])      NEW MEDICATIONS STARTED DURING THIS VISIT:  ED Discharge Orders    None       Note:  This document was prepared using Dragon voice recognition software and may include unintentional dictation errors.    Alfred Levins, [place], MD 08/17/19 [phone]

## 2019-08-16 NOTE — ED Notes (Signed)
Patient to MRI.

## 2019-08-16 NOTE — ED Triage Notes (Signed)
Pt reports having a bad HA all night long and left sided numbness all day which she first noticed at 1000. Pt has no deficits at this time but still feels like she is experiencing numbness.

## 2019-08-17 ENCOUNTER — Observation Stay: Payer: Medicaid Other

## 2019-08-17 ENCOUNTER — Other Ambulatory Visit: Payer: Self-pay

## 2019-08-17 ENCOUNTER — Observation Stay (HOSPITAL_BASED_OUTPATIENT_CLINIC_OR_DEPARTMENT_OTHER)
Admit: 2019-08-17 | Discharge: 2019-08-17 | Disposition: A | Discharge status: Home / Self Care | Payer: Medicaid Other | Attending: Family Medicine | Admitting: Family Medicine

## 2019-08-17 DIAGNOSIS — I351 Nonrheumatic aortic (valve) insufficiency: Secondary | ICD-10-CM | POA: Diagnosis not present

## 2019-08-17 DIAGNOSIS — I639 Cerebral infarction, unspecified: Secondary | ICD-10-CM

## 2019-08-17 DIAGNOSIS — I350 Nonrheumatic aortic (valve) stenosis: Secondary | ICD-10-CM

## 2019-08-17 DIAGNOSIS — I10 Essential (primary) hypertension: Secondary | ICD-10-CM | POA: Diagnosis not present

## 2019-08-17 DIAGNOSIS — E785 Hyperlipidemia, unspecified: Secondary | ICD-10-CM

## 2019-08-17 DIAGNOSIS — R200 Anesthesia of skin: Secondary | ICD-10-CM

## 2019-08-17 LAB — URINE DRUG SCREEN, QUALITATIVE (ARMC ONLY)
Amphetamines, Ur Screen: NOT DETECTED
Barbiturates, Ur Screen: NOT DETECTED
Benzodiazepine, Ur Scrn: NOT DETECTED
Cannabinoid 50 Ng, Ur ~~LOC~~: POSITIVE — AB
Cocaine Metabolite,Ur ~~LOC~~: NOT DETECTED
MDMA (Ecstasy)Ur Screen: NOT DETECTED
Methadone Scn, Ur: NOT DETECTED
Opiate, Ur Screen: NOT DETECTED
Phencyclidine (PCP) Ur S: NOT DETECTED
Tricyclic, Ur Screen: NOT DETECTED

## 2019-08-17 LAB — URINALYSIS, ROUTINE W REFLEX MICROSCOPIC
Bilirubin Urine: NEGATIVE
Glucose, UA: NEGATIVE mg/dL
Hgb urine dipstick: NEGATIVE
Ketones, ur: NEGATIVE mg/dL
Leukocytes,Ua: NEGATIVE
Nitrite: NEGATIVE
Protein, ur: NEGATIVE mg/dL
Specific Gravity, Urine: 1.006 (ref 1.005–1.030)
pH: 7 (ref 5.0–8.0)

## 2019-08-17 LAB — TROPONIN I (HIGH SENSITIVITY): Troponin I (High Sensitivity): 5 ng/L (ref <18)

## 2019-08-17 LAB — HEMOGLOBIN A1C
Hgb A1c MFr Bld: 5.6 % (ref 4.8–5.6)
Mean Plasma Glucose: 114.02 mg/dL

## 2019-08-17 LAB — ECHOCARDIOGRAM COMPLETE
Height: 62 in
Weight: 2640 oz

## 2019-08-17 LAB — LIPID PANEL
Cholesterol: 129 mg/dL (ref 0–200)
HDL: 62 mg/dL (ref >40)
LDL Cholesterol: 56 mg/dL (ref 0–99)
Total CHOL/HDL Ratio: 2.1 RATIO
Triglycerides: 56 mg/dL (ref <150)
VLDL: 11 mg/dL (ref 0–40)

## 2019-08-17 LAB — HIV ANTIBODY (ROUTINE TESTING W REFLEX): HIV Screen 4th Generation wRfx: NONREACTIVE

## 2019-08-17 LAB — SARS CORONAVIRUS 2 BY RT PCR (HOSPITAL ORDER, PERFORMED IN ~~LOC~~ HOSPITAL LAB): SARS Coronavirus 2: NEGATIVE

## 2019-08-17 MED ORDER — ACETAMINOPHEN 160 MG/5ML PO SOLN
650.0000 mg | ORAL | Status: DC | PRN
  Filled 2019-08-17: qty 20.3

## 2019-08-17 MED ORDER — AMLODIPINE BESYLATE 5 MG PO TABS
5.0000 mg | ORAL_TABLET | Freq: Every day | ORAL | Status: DC
  Administered 2019-08-17 – 2019-08-18 (×2): 5 mg via ORAL
  Filled 2019-08-17 (×2): qty 1

## 2019-08-17 MED ORDER — COQ10 200 MG PO CAPS: ORAL_CAPSULE | Freq: Every day | ORAL | Status: DC

## 2019-08-17 MED ORDER — KETOROLAC TROMETHAMINE 15 MG/ML IJ SOLN
15.0000 mg | Freq: Four times a day (QID) | INTRAMUSCULAR | Status: DC | PRN
  Administered 2019-08-17: 15 mg via INTRAVENOUS
  Filled 2019-08-17 (×3): qty 1

## 2019-08-17 MED ORDER — ATORVASTATIN CALCIUM 20 MG PO TABS
20.0000 mg | ORAL_TABLET | Freq: Every day | ORAL | Status: DC
  Administered 2019-08-17 – 2019-08-18 (×2): 20 mg via ORAL
  Filled 2019-08-17 (×2): qty 1

## 2019-08-17 MED ORDER — VITAMIN D (ERGOCALCIFEROL) 1.25 MG (50000 UNIT) PO CAPS: 50000.0000 [IU] | ORAL_CAPSULE | ORAL | Status: DC

## 2019-08-17 MED ORDER — TRAMADOL HCL 50 MG PO TABS
50.0000 mg | ORAL_TABLET | Freq: Four times a day (QID) | ORAL | Status: DC | PRN
  Administered 2019-08-17 – 2019-08-18 (×2): 50 mg via ORAL
  Filled 2019-08-17 (×2): qty 1

## 2019-08-17 MED ORDER — ALBUTEROL SULFATE HFA 108 (90 BASE) MCG/ACT IN AERS: 2.0000 | INHALATION_SPRAY | RESPIRATORY_TRACT | Status: DC | PRN

## 2019-08-17 MED ORDER — SENNOSIDES-DOCUSATE SODIUM 8.6-50 MG PO TABS: 1.0000 | ORAL_TABLET | Freq: Every evening | ORAL | Status: DC | PRN

## 2019-08-17 MED ORDER — ENOXAPARIN SODIUM 40 MG/0.4ML ~~LOC~~ SOLN: 40.0000 mg | SUBCUTANEOUS | Status: DC

## 2019-08-17 MED ORDER — ENOXAPARIN SODIUM 40 MG/0.4ML ~~LOC~~ SOLN
40.0000 mg | SUBCUTANEOUS | Status: DC
  Administered 2019-08-17 – 2019-08-18 (×2): 40 mg via SUBCUTANEOUS
  Filled 2019-08-17 (×2): qty 0.4

## 2019-08-17 MED ORDER — ACETAMINOPHEN 325 MG PO TABS
650.0000 mg | ORAL_TABLET | ORAL | Status: DC | PRN
  Administered 2019-08-17: 650 mg via ORAL
  Filled 2019-08-17: qty 2

## 2019-08-17 MED ORDER — METOCLOPRAMIDE HCL 5 MG/ML IJ SOLN
10.0000 mg | Freq: Four times a day (QID) | INTRAMUSCULAR | Status: DC | PRN
  Administered 2019-08-17: 10 mg via INTRAVENOUS
  Filled 2019-08-17: qty 2

## 2019-08-17 MED ORDER — HYDROCODONE-ACETAMINOPHEN 5-325 MG PO TABS
1.0000 | ORAL_TABLET | ORAL | Status: DC | PRN
  Administered 2019-08-18 (×3): 1 via ORAL
  Filled 2019-08-17 (×3): qty 1

## 2019-08-17 MED ORDER — ASPIRIN 81 MG PO CHEW
324.0000 mg | CHEWABLE_TABLET | Freq: Once | ORAL | Status: AC
  Administered 2019-08-17: 324 mg via ORAL
  Filled 2019-08-17: qty 4

## 2019-08-17 MED ORDER — CHLORHEXIDINE GLUCONATE 0.12 % MT SOLN
15.0000 mL | Freq: Two times a day (BID) | OROMUCOSAL | Status: DC
  Administered 2019-08-18: 10:00:00 15 mL via OROMUCOSAL
  Filled 2019-08-17: qty 15

## 2019-08-17 MED ORDER — ACETAMINOPHEN 650 MG RE SUPP: 650.0000 mg | RECTAL | Status: DC | PRN

## 2019-08-17 MED ORDER — STROKE: EARLY STAGES OF RECOVERY BOOK: Freq: Once | Status: AC

## 2019-08-17 MED ORDER — ORAL CARE MOUTH RINSE
15.0000 mL | Freq: Two times a day (BID) | OROMUCOSAL | Status: DC
  Administered 2019-08-17 – 2019-08-18 (×2): 15 mL via OROMUCOSAL

## 2019-08-17 MED ORDER — SODIUM CHLORIDE 0.9 % IV SOLN: INTRAVENOUS | Status: DC

## 2019-08-17 MED ORDER — ALBUTEROL SULFATE (2.5 MG/3ML) 0.083% IN NEBU: 2.5000 mg | INHALATION_SOLUTION | RESPIRATORY_TRACT | Status: DC | PRN

## 2019-08-17 MED ORDER — FLUTICASONE PROPIONATE 50 MCG/ACT NA SUSP
2.0000 | Freq: Every day | NASAL | Status: DC
  Administered 2019-08-18: 10:00:00 2 via NASAL
  Filled 2019-08-17: qty 16

## 2019-08-17 NOTE — ED Notes (Signed)
[place] with pt

## 2019-08-17 NOTE — Progress Notes (Signed)
PHARMACIST - PHYSICIAN ORDER COMMUNICATION  CONCERNING: P&T Medication Policy on Herbal Medications  DESCRIPTION:  This patient's order for:  CoQ10 Capsules  has been noted.  This product(s) is classified as an "herbal" or natural product. Due to a lack of definitive safety studies or FDA approval, nonstandard manufacturing practices, plus the potential risk of unknown drug-drug interactions while on inpatient medications, the Pharmacy and Therapeutics Committee does not permit the use of "herbal" or natural products of this type within [facility].   ACTION TAKEN: The pharmacy department is unable to verify this order at this time and your patient has been informed of this safety policy. Please reevaluate patient's clinical condition at discharge and address if the herbal or natural product(s) should be resumed at that time.  Pearla Dubonnet, PharmD Clinical Pharmacist 08/17/2019 3:23 AM

## 2019-08-17 NOTE — ED Notes (Signed)
Patient returned to room. 

## 2019-08-17 NOTE — Progress Notes (Signed)
  PROGRESS NOTE    Stephanie Ross  KZL:[id number] DOB: 03-18-1966 DOA: 08/16/2019  PCP: [facility]    LOS - 0    Patient admitted overnight with left-sided heaviness/numbness, unsteady gait, vision changes and headache.  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    Patient reports she was told in July 2019 after a heart cath that they saw A-fib.  Patient denies history of palpitations.  She does not take blood thinners but is on statin and CoQ10.  Interval subjective: Patient seen in ED on hold for a bed.  Echo just finished.  Patient says left side feels the same.  No longer having vision symptoms.  Does have headache, says Tylenol didn't help earlier.    Exam: unequal pupils, no diplopia, EOMI.  Slight left facial droop.  Upper and lower extremity motor equal 5/5 bilaterally.  Symmetric sensation to light touch on face, upper and lower extremities.  Normal speech and comprehension.  Hyperreflexic left patellar DTR without clonus.  I have reviewed the full H&P by Dr. Audery Amel in detail, and I agree with the assessment and plan as outlined therein. In addition: --Dr. Irish Elders notified of consult --follow up echo, carotids --PT/OT/ST evaluations pending  No Charge    Ezekiel Slocumb, DO [facility]   If 7PM-7AM, please contact night-coverage [email or web address] 08/17/2019, 12:09 PM

## 2019-08-17 NOTE — Progress Notes (Signed)
*  PRELIMINARY RESULTS* Echocardiogram 2D Echocardiogram has been performed.  Stephanie Ross 08/17/2019, 12:02 PM

## 2019-08-17 NOTE — ED Notes (Signed)
PT with Pt.

## 2019-08-17 NOTE — Progress Notes (Signed)
Stroke book provided and reviewed all questions answered

## 2019-08-17 NOTE — Consult Note (Signed)
Reason for Consult: L sided numbness  Requesting Physician: Dr. Sidney Ace  CC: L side numbness   HPI: Stephanie Ross is an 54 y.o. female  female with a known history of asthma, CHF, COPD and hypertension, who presented to the emergency room with the onset of headache with associated left sided numbness and weakness.  No cough beyond her normal for COPD.  No fever or chills.  No nausea or vomiting or abdominal pain.  No dysuria, oliguria or hematuria or flank pain.  No new urinary or stool incontinence.  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  No anti platelet therapy prior to admission.  PAST MEDICAL HISTORY:     Past Medical History:  Diagnosis Date  . Asthma   . Cancer ([place])   . CHF (congestive heart failure) ([place])   . COPD (chronic obstructive pulmonary disease) ([place])   . Hypertension     Past Surgical History:  Procedure Laterality Date  . BILATERAL CARPAL TUNNEL RELEASE    . CERVICAL ABLATION    . ROTATOR CUFF REPAIR Right     Family History  Problem Relation Age of Onset  . Breast cancer Sister   . Breast cancer Paternal Grandmother     Social History:  reports that she quit smoking about 4 weeks ago. She smoked 0.25 packs per day. She has never used smokeless tobacco. She reports current drug use. Drug: Marijuana. She reports that she does not drink alcohol.  Allergies  Allergen Reactions  . Amoxicillin Other (See Comments)    Has patient had a PCN reaction causing immediate rash, facial/tongue/throat swelling, SOB or lightheadedness with hypotension: Yes Has patient had a PCN reaction causing severe rash involving mucus membranes or skin necrosis: Yes Has patient had a PCN reaction that required  hospitalization:yes Has patient had a PCN reaction occurring within the last 10 years: yes If all of the above answers are "NO", then may proceed with Cephalosporin use.   . Prednisone Other (See Comments)    Full body rash     Medications: I have reviewed the patient's current medications.  ROS: History obtained from the patient  General ROS: negative for - chills, fatigue, fever, night sweats, weight gain or weight loss Psychological ROS: negative for - behavioral disorder, hallucinations, memory difficulties, mood swings or suicidal ideation Ophthalmic ROS: negative for - blurry vision, double vision, eye pain or loss of vision ENT ROS: negative for - epistaxis, nasal discharge, oral lesions, sore throat, tinnitus or vertigo Allergy and Immunology ROS: negative for - hives or itchy/watery eyes Hematological and Lymphatic ROS: negative for - bleeding problems, bruising or swollen lymph nodes Endocrine ROS: negative for - galactorrhea, hair pattern changes, polydipsia/polyuria or temperature intolerance Respiratory ROS: negative for - cough, hemoptysis, shortness of breath or wheezing Cardiovascular ROS: negative for - chest pain, dyspnea on exertion, edema or irregular heartbeat Gastrointestinal ROS: negative for - abdominal pain, diarrhea, hematemesis, nausea/vomiting or stool incontinence Genito-Urinary ROS: negative for - dysuria, hematuria, incontinence or urinary frequency/urgency Musculoskeletal ROS: negative for - joint swelling or muscular weakness Neurological ROS: as noted in HPI Dermatological ROS: negative for rash and skin lesion changes  Physical Examination: Blood pressure (!) 135/57, pulse 64, temperature 98.2 F (36.8 C), temperature source Oral, resp. rate 15, height 5\' 2"  (1.575  m), weight 74.8 kg, SpO2 91 %.    Neurological Examination   Mental Status: Alert, oriented, thought content appropriate.  Speech fluent without evidence of aphasia.  Able to follow 3  step commands without difficulty. Cranial Nerves: II: Discs flat bilaterally; Visual fields grossly normal, pupils equal, round, reactive to light and accommodation III,IV, VI: ptosis not present, extra-ocular motions intact bilaterally V,VII: smile symmetric, facial light touch sensation normal bilaterally VIII: hearing normal bilaterally IX,X: gag reflex present XI: bilateral shoulder shrug XII: midline tongue extension Motor: Right : Upper extremity   5/5    Left:     Upper extremity   5/5  Lower extremity   5/5     Lower extremity   5/5 Tone and bulk:normal tone throughout; no atrophy noted Sensory: Pinprick and light touch diminished LUE and LLE Deep Tendon Reflexes: 2+ and symmetric throughout Plantars: Right: downgoing   Left: downgoing Cerebellar: normal finger-to-nose, normal rapid alternating movements and normal heel-to-shin test Gait: not tested       Laboratory Studies:   Basic Metabolic Panel: Recent Labs  Lab 08/16/19 2233  NA 132*  K 3.7  CL 97*  CO2 27  GLUCOSE 114*  BUN 13  CREATININE 0.72  CALCIUM 8.9    Liver Function Tests: No results for input(s): AST, ALT, ALKPHOS, BILITOT, PROT, ALBUMIN in the last 168 hours. No results for input(s): LIPASE, AMYLASE in the last 168 hours. No results for input(s): AMMONIA in the last 168 hours.  CBC: Recent Labs  Lab 08/16/19 2233  WBC 7.7  NEUTROABS 3.2  HGB 13.0  HCT 36.0  MCV 80.9  PLT 94*    Cardiac Enzymes: No results for input(s): CKTOTAL, CKMB, CKMBINDEX, TROPONINI in the last 168 hours.  BNP: Invalid input(s): POCBNP  CBG: No results for input(s): GLUCAP in the last 168 hours.  Microbiology: Results for orders placed or performed during the hospital encounter of 08/16/19  SARS Coronavirus 2 by RT PCR (hospital order, performed in [facility] hospital lab) Nasopharyngeal Nasopharyngeal Swab     Status: None   Collection Time: 08/17/19  2:18 AM   Specimen: Nasopharyngeal Swab  Result  Value Ref Range Status   SARS Coronavirus 2 NEGATIVE NEGATIVE Final    Comment: (NOTE) SARS-CoV-2 target nucleic acids are NOT DETECTED. The SARS-CoV-2 RNA is generally detectable in upper and lower respiratory specimens during the acute phase of infection. The lowest concentration of SARS-CoV-2 viral copies this assay can detect is 250 copies / mL. A negative result does not preclude SARS-CoV-2 infection and should not be used as the sole basis for treatment or other patient management decisions.  A negative result may occur with improper specimen collection / handling, submission of specimen other than nasopharyngeal swab, presence of viral mutation(s) within the areas targeted by this assay, and inadequate number of viral copies (<250 copies / mL). A negative result must be combined with clinical observations, patient history, and epidemiological information. Fact Sheet for Patients:   StrictlyIdeas.no Fact Sheet for Healthcare Providers: BankingDealers.co.za This test is not yet approved or cleared  by the [place] FDA and has been authorized for detection and/or diagnosis of SARS-CoV-2 by FDA under an Emergency Use Authorization (EUA).  This EUA will remain in effect (meaning this test can be used) for the duration of the COVID-19 declaration under Section 564(b)(1) of the Act, 21 U.S.C. section 360bbb-3(b)(1), unless the authorization is terminated or revoked sooner. Performed at [facility], [address] [place],  [place] [address]     Coagulation Studies: No results for input(s): LABPROT, INR in the last 72 hours.  Urinalysis:  Recent Labs  Lab 08/17/19 0135  COLORURINE YELLOW*  LABSPEC 1.006  PHURINE 7.0  GLUCOSEU NEGATIVE  HGBUR NEGATIVE  BILIRUBINUR NEGATIVE  KETONESUR NEGATIVE  PROTEINUR NEGATIVE  NITRITE NEGATIVE  LEUKOCYTESUR NEGATIVE    Lipid Panel:     Component Value Date/Time   CHOL  129 08/17/2019 0526   TRIG 56 08/17/2019 0526   HDL 62 08/17/2019 0526   CHOLHDL 2.1 08/17/2019 0526   VLDL 11 08/17/2019 0526   LDLCALC 56 08/17/2019 0526    HgbA1C:  Lab Results  Component Value Date   HGBA1C 5.6 08/17/2019    Urine Drug Screen:      Component Value Date/Time   LABOPIA NONE DETECTED 08/17/2019 0135   COCAINSCRNUR NONE DETECTED 08/17/2019 0135   LABBENZ NONE DETECTED 08/17/2019 0135   AMPHETMU NONE DETECTED 08/17/2019 0135   THCU POSITIVE (A) 08/17/2019 0135   LABBARB NONE DETECTED 08/17/2019 0135    Alcohol Level: No results for input(s): ETH in the last 168 hours.  Other results: EKG: normal EKG, normal sinus rhythm, unchanged from previous tracings.  Imaging: CT Head Wo Contrast  Result Date: 08/16/2019 CLINICAL DATA:  Headache and left-sided numbness for 1 day EXAM: CT HEAD WITHOUT CONTRAST TECHNIQUE: Contiguous axial images were obtained from the base of the skull through the vertex without intravenous contrast. COMPARISON:  None. FINDINGS: Brain: No acute infarct or hemorrhage. Lateral ventricles and midline structures are unremarkable. No acute extra-axial fluid collections. No mass effect. Vascular: No hyperdense vessel or unexpected calcification. Skull: Normal. Negative for fracture or focal lesion. Sinuses/Orbits: No acute finding. Other: None. IMPRESSION: Normal CT head without contrast. Electronically Signed   By: Randa Ngo M.D.   On: 08/16/2019 23:16   MR BRAIN WO CONTRAST  Result Date: 08/17/2019 CLINICAL DATA:  Initial evaluation for acute headache with left-sided numbness. EXAM: MRI HEAD WITHOUT CONTRAST TECHNIQUE: Multiplanar, multiecho pulse sequences of the brain and surrounding structures were obtained without intravenous contrast. COMPARISON:  Prior head CT from 08/16/2019. FINDINGS: Brain: Cerebral volume within normal limits for age. Minimal scattered cerebral white matter changes for age. Probable tiny remote right cerebellar infarct  noted (series 15, image 15). Punctate 3 mm focus of restricted diffusion seen involving the dorsal right medulla, consistent with a small acute ischemic infarct (series 5, image 9). No associated hemorrhage or mass effect. No other evidence for acute or subacute ischemia. Gray-white matter differentiation otherwise maintained. No encephalomalacia to suggest chronic cortical infarction elsewhere within the brain. No foci of susceptibility artifact to suggest acute or chronic intracranial hemorrhage. No mass lesion, midline shift or mass effect. No hydrocephalus or extra-axial fluid collection. Pituitary gland suprasellar region normal. Midline structures intact. Vascular: Major intracranial vascular flow voids are maintained. Skull and upper cervical spine: Craniocervical junction normal. Bone marrow signal intensity within normal limits. No scalp soft tissue abnormality. Sinuses/Orbits: Globes and orbital soft tissues within normal limits. Mild scattered mucosal thickening noted within the ethmoidal air cells and maxillary sinuses. Small right maxillary sinus retention cyst noted. No significant mastoid effusion. Inner ear structures normal. Other: None. IMPRESSION: 1. 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 2. 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 3. Minimal cerebral white matter changes for age, nonspecific, but most commonly related to chronic small vessel ischemic disease. Electronically Signed   By: Pincus Badder.D.  On: 08/17/2019 01:07     Assessment/Plan:  54 y.o. female  female with a known history of asthma, CHF, COPD and hypertension, who presented to the emergency room with the onset of headache with associated left sided numbness and weakness.  No cough beyond her normal for COPD.  No fever or chills.  No nausea or vomiting or abdominal pain.  No dysuria, oliguria or hematuria or flank pain.  No new urinary or stool  incontinence.  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  No anti platelet therapy prior to admission.   - Stroke likely in setting of small vessel disease with chronic HTN,  - ASA 325 daily/statin therapy - pt/ot  -d/c planning based on pt/ot 08/17/2019, 12:51 PM

## 2019-08-17 NOTE — Progress Notes (Addendum)
   08/17/19 1701  Pain Assessment  Pain Scale 0-10  Pain Score 10  Pain Intervention(s) Repositioned;Distraction;MD notified (Comment) (notififed MD )  notified attending Eugenie Norrie, via secure chat. Awaiting response.  [id number] notified Nicole Kindred via amion awaiting response

## 2019-08-17 NOTE — ED Notes (Signed)
Patient reports headache, rated 6/10.  Patient states she told admitting MD about headache.

## 2019-08-17 NOTE — H&P (Signed)
[facility] NAME: Stephanie Ross    MR#:  [id number]  DATE OF BIRTH:  04-Nov-1965  DATE OF ADMISSION:  08/16/2019  PRIMARY CARE PHYSICIAN: [facility]   REQUESTING/REFERRING PHYSICIAN: Gonzella Lex, MD  CHIEF COMPLAINT:   Chief Complaint  Patient presents with  . Numbness    HISTORY OF PRESENT ILLNESS:  Stephanie Ross  is a 54 y.o. Caucasian female with a known history of asthma, CHF, COPD and hypertension, who presented to the emergency room with the onset of headache with associated left sided numbness and weakness.  She was noted to have asymmetric pupils with more dilated pupil on the left. She admitted to imbalance and unsteady gait.  She denies any other neurological deficits.  No chest pain or dyspnea or palpitations.  No cough beyond her normal for COPD.  No fever or chills.  No nausea or vomiting or abdominal pain.  No dysuria, oliguria or hematuria or flank pain.  No new urinary or stool incontinence.  Upon presentation to the emergency room, pulse confused 99% on 2 L O2 by nasal cannula and otherwise vital signs were within normal.  Labs revealed mild hyponatremia otherwise CMP and CBC were unremarkable.  High-sensitivity troponin I was 5.  UA was negative and EKG showed no sinus rhythm rate 69 with left atrial enlargement and borderline intraventricular conduction delay with poor R wave progression.  Noncontrasted CT scan was negative.  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  The patient was given 4 baby aspirin.  She will be admitted to an observation progressive unit bed for further evaluation and management. PAST MEDICAL HISTORY:   Past Medical  History:  Diagnosis Date  . Asthma   . Cancer ([place])   . CHF (congestive heart failure) ([place])   . COPD (chronic obstructive pulmonary disease) ([place])   . Hypertension     PAST SURGICAL HISTORY:   Past Surgical History:  Procedure Laterality Date  . BILATERAL CARPAL TUNNEL RELEASE    . CERVICAL ABLATION    . ROTATOR CUFF REPAIR Right     SOCIAL HISTORY:   Social History   Tobacco Use  . Smoking status: Former Smoker    Packs/day: 0.25    Quit date: 07/17/2019    Years since quitting: 0.0  . Smokeless tobacco: Never Used  Substance Use Topics  . Alcohol use: No    FAMILY HISTORY:   Family History  Problem Relation Age of Onset  . Breast cancer Sister   . Breast cancer Paternal Grandmother     DRUG ALLERGIES:   Allergies  Allergen Reactions  . Amoxicillin Other (See Comments)    Has patient had a PCN reaction causing immediate rash, facial/tongue/throat swelling, SOB or lightheadedness with hypotension: Yes Has patient had a PCN reaction causing severe rash involving mucus membranes or skin necrosis: Yes Has patient had a PCN reaction that required hospitalization:yes Has patient had a PCN reaction occurring within the last 10 years: yes If all of the above answers are "NO", then may proceed with Cephalosporin use.   . Prednisone Other (See Comments)    Full body rash     REVIEW OF SYSTEMS:   ROS As per history of present illness. All pertinent systems  were reviewed above. Constitutional,  HEENT, cardiovascular, respiratory, GI, GU, musculoskeletal, neuro, psychiatric, endocrine,  integumentary and hematologic systems were reviewed and are otherwise  negative/unremarkable except for positive findings mentioned above in the HPI.   MEDICATIONS AT HOME:   Prior to Admission medications   Medication Sig Start Date End Date Taking? Authorizing Provider  albuterol (PROVENTIL HFA;VENTOLIN HFA) 108 (90 Base) MCG/ACT inhaler Inhale 2 puffs into the lungs every 4  (four) hours as needed for wheezing or shortness of breath. 05/07/17   Noemi Chapel, MD  amLODipine (NORVASC) 5 MG tablet Take 5 mg by mouth daily.    [provider]  atorvastatin (LIPITOR) 20 MG tablet Take 20 mg by mouth daily.    [provider]  budesonide-formoterol (SYMBICORT) 160-4.5 MCG/ACT inhaler Inhale 2 puffs into the lungs 2 (two) times daily.    [provider]  celecoxib (CELEBREX) 200 MG capsule Take 200 mg by mouth daily.    [provider]  Coenzyme Q10 (COQ10) 200 MG CAPS Take by mouth daily.    [provider]  ergocalciferol (VITAMIN D2) 1.25 MG (50000 UT) capsule Take 50,000 Units by mouth once a week.    [provider]  fluticasone (FLONASE) 50 MCG/ACT nasal spray Place into both nostrils daily.    [provider]  naproxen (NAPROSYN) 375 MG tablet Take 375 mg by mouth as needed.    [provider]      VITAL SIGNS:  Blood pressure (!) 149/78, pulse 66, temperature 98.2 F (36.8 C), temperature source Oral, resp. rate 18, height 5\' 2"  (1.575 m), weight 74.8 kg, SpO2 99 %.  PHYSICAL EXAMINATION:  Physical Exam  GENERAL:  54 y.o.-year-old Caucasian female patient lying in the bed with no acute distress.  EYES: Pupils equal, round, and equal and reactive to light and accommodation. No scleral icterus. Extraocular muscles intact.  HEENT: Head atraumatic, normocephalic. Oropharynx and nasopharynx clear.  NECK:  Supple, no jugular venous distention. No thyroid enlargement, no tenderness.  LUNGS: Normal breath sounds bilaterally, no wheezing, rales,rhonchi or crepitation. No use of accessory muscles of respiration.  CARDIOVASCULAR: Regular rate and rhythm, S1, S2 normal. No murmurs, rubs, or gallops.  ABDOMEN: Soft, nondistended, nontender. Bowel sounds present. No organomegaly or mass.  EXTREMITIES: No pedal edema, cyanosis, or clubbing.  NEUROLOGIC: Cranial nerves II through XII are intact except  for unequal pupil with 3 mm dilated left pupil compared to 2 mm right pupil. Muscle strength 5/5 in all extremities. Sensation intact. Gait not checked.  PSYCHIATRIC: The patient is alert and oriented x 3.  Normal affect and good eye contact. SKIN: No obvious rash, lesion, or ulcer.   LABORATORY PANEL:   CBC Recent Labs  Lab 08/16/19 2233  WBC 7.7  HGB 13.0  HCT 36.0  PLT 94*   ------------------------------------------------------------------------------------------------------------------  Chemistries  Recent Labs  Lab 08/16/19 2233  NA 132*  K 3.7  CL 97*  CO2 27  GLUCOSE 114*  BUN 13  CREATININE 0.72  CALCIUM 8.9   ------------------------------------------------------------------------------------------------------------------  Cardiac Enzymes No results for input(s): TROPONINI in the last 168 hours. ------------------------------------------------------------------------------------------------------------------  RADIOLOGY:  CT Head Wo Contrast  Result Date: 08/16/2019 CLINICAL DATA:  Headache and left-sided numbness for 1 day EXAM: CT HEAD WITHOUT CONTRAST TECHNIQUE: Contiguous axial images were obtained from the base of the skull through the vertex without intravenous contrast. COMPARISON:  None. FINDINGS: Brain: No acute infarct or hemorrhage. Lateral ventricles and midline structures are unremarkable. No acute extra-axial fluid  collections. No mass effect. Vascular: No hyperdense vessel or unexpected calcification. Skull: Normal. Negative for fracture or focal lesion. Sinuses/Orbits: No acute finding. Other: None. IMPRESSION: Normal CT head without contrast. Electronically Signed   By: Randa Ngo M.D.   On: 08/16/2019 23:16   MR BRAIN WO CONTRAST  Result Date: 08/17/2019 CLINICAL DATA:  Initial evaluation for acute headache with left-sided numbness. EXAM: MRI HEAD WITHOUT CONTRAST TECHNIQUE: Multiplanar, multiecho pulse sequences of the brain and surrounding  structures were obtained without intravenous contrast. COMPARISON:  Prior head CT from 08/16/2019. FINDINGS: Brain: Cerebral volume within normal limits for age. Minimal scattered cerebral white matter changes for age. Probable tiny remote right cerebellar infarct noted (series 15, image 15). Punctate 3 mm focus of restricted diffusion seen involving the dorsal right medulla, consistent with a small acute ischemic infarct (series 5, image 9). No associated hemorrhage or mass effect. No other evidence for acute or subacute ischemia. Gray-white matter differentiation otherwise maintained. No encephalomalacia to suggest chronic cortical infarction elsewhere within the brain. No foci of susceptibility artifact to suggest acute or chronic intracranial hemorrhage. No mass lesion, midline shift or mass effect. No hydrocephalus or extra-axial fluid collection. Pituitary gland suprasellar region normal. Midline structures intact. Vascular: Major intracranial vascular flow voids are maintained. Skull and upper cervical spine: Craniocervical junction normal. Bone marrow signal intensity within normal limits. No scalp soft tissue abnormality. Sinuses/Orbits: Globes and orbital soft tissues within normal limits. Mild scattered mucosal thickening noted within the ethmoidal air cells and maxillary sinuses. Small right maxillary sinus retention cyst noted. No significant mastoid effusion. Inner ear structures normal. Other: None. IMPRESSION: 1. 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 2. 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 3. Minimal cerebral white matter changes for age, nonspecific, but most commonly related to chronic small vessel ischemic disease. Electronically Signed   By: Jeannine Boga M.D.   On: 08/17/2019 01:07      IMPRESSION AND PLAN:  1.  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  The patient have left-sided numbness, weakness and  headache as well as ataxia. -The patient will be admitted to an observation progressive unit bed. -We will follow neurochecks every 4 hours for 24 hours. -Obtain a 2D echo and bilateral carotid Doppler. -The patient will be placed on aspirin and continued on statin therapy. -We will check fasting lipids in a.m. -PT/OT/ST consults will be obtained. -Neurology consultation will be obtained. -I notified Dr. Doy Mince about the patient.  2.  Hypertension. -Norvasc will be continued with permissive parameters.  3.  Dyslipidemia. -Statin therapy will be resumed.  4.  COPD and asthma without exacerbation. -We will continue Spiriva and Singulair. -The patient will be placed on as needed duo nebs. -We will hold off Trelegy for now.  5.  DVT prophylaxis. -Subcutaneous Lovenox.  6.  GI prophylaxis. -PPI therapy will be continued.   All the records are reviewed and case discussed with ED provider. The plan of care was discussed in details with the patient (and family). I answered all questions. The patient agreed to proceed with the above mentioned plan. Further management will depend upon hospital course.   CODE STATUS: Full code  Status is: Observation  The patient remains OBS appropriate and will d/c before 2 midnights.  Dispo: The patient is from: Home              Anticipated d/c is to: Home  Anticipated d/c date is: 1 day              Patient currently is not medically stable to d/c.   TOTAL TIME TAKING CARE OF THIS PATIENT: 55 minutes.    Christel Mormon M.D on 08/17/2019 at 2:10 AM  [facility]   From 7 PM-7 AM, contact night-coverage [email or web address]  CC: Primary care physician; [facility]   Note: This dictation was prepared with Dragon dictation along with smaller phrase technology. Any transcriptional errors that result from this process are unintentional.

## 2019-08-17 NOTE — ED Notes (Signed)
Patient ambulatory to bedside toilet with steady gait and minimal assistance from this rn.

## 2019-08-17 NOTE — Evaluation (Signed)
Occupational Therapy Evaluation Patient Details Name: Stephanie Ross MRN: [id number] DOB: 10/05/1965 Today's Date: 08/17/2019    History of Present Illness Stephanie Ross is an 54 y.o. female  female with a known history of asthma, CHF, COPD and hypertension, who presented to the emergency room with the onset of headache with associated left sided numbness and weakness. 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   Clinical Impression   Stephanie Ross was seen for OT evaluation this date. Prior to hospital admission, pt was MOD I for mobility and I/ADLs using SPC intermittently. Pt lives c daughter and SIL in mobile home c 6-7 STE. Currently pt reporting only symptom as paresthesias to LUE/LLE - currently do not impact functional ADL tasks. Grossly INDEPENDENT c seated grooming and UB/LB dressing tasks. Pt demonstrates mild balance deficits during in room mobility - improved c use of RW. Pt demonstrates baseline independence to perform ADL and mobility tasks and no strength, coordination, cognitive, or visual deficits appreciated with assessment. No skilled OT needs identified. Will sign off. Please re-consult if additional OT needs arise.    Follow Up Recommendations  No OT follow up    Equipment Recommendations  (Tub Transfer Bench)    Recommendations for Other Services       Precautions / Restrictions Precautions Precautions: None Restrictions Weight Bearing Restrictions: No      Mobility Bed Mobility Overal bed mobility: Independent                Transfers Overall transfer level: Modified independent Equipment used: Rolling walker (2 wheeled)             General transfer comment: SUPERVISION c no AD  imporving to MOD I c RW sit<>stand EOB and in room mobility     Balance Overall balance assessment: Mild deficits observed, not formally tested(Reaches inside BOS c no LOBs. Stands on one leg c no LOB)                                         ADL either performed or assessed with clinical judgement   ADL Overall ADL's : Independent                                       General ADL Comments: INDEPENDENT LBD, self-feeding, UBD, and simulated toilet t/f      Vision         Perception     Praxis      Pertinent Vitals/Pain Pain Assessment: No/denies pain     Hand Dominance Left   Extremity/Trunk Assessment Upper Extremity Assessment Upper Extremity Assessment: Overall WFL for tasks assessed(paresthsias t/o LUE - not affecting coordination, f/g motor)   Lower Extremity Assessment Lower Extremity Assessment: Overall WFL for tasks assessed(paresthsias t/o LLE)       Communication Communication Communication: No difficulties   Cognition Arousal/Alertness: Awake/alert Behavior During Therapy: WFL for tasks assessed/performed Overall Cognitive Status: Within Functional Limits for tasks assessed  General Comments  SpO2 97% on 2L [place] standing     Exercises Exercises: Other exercises Other Exercises Other Exercises: Pt educated re: OT role, DME recs, d/c recs, falls prevention, energy conservation Other Exercises: LBD, UBD, self-feeding/drinking, simulated toilet t/f   Shoulder Instructions      Home Living Family/patient expects to be discharged to:: Private residence Living Arrangements: Children(DTR, SIL ) Available Help at Discharge: Family;Available 24 hours/day Type of Home: Mobile home Home Access: Stairs to enter [employer] of Steps: 6-7 Entrance Stairs-Rails: Right;Left;Can reach both Home Layout: One level     Bathroom Shower/Tub: Engineer, site: Standard     Home Equipment: Cane - single point          Prior Functioning/Environment Level of Independence: Independent with assistive device(s)        Comments: Pt reports using SPC for community mobility PRN         OT Problem List: Impaired balance (sitting and/or standing);Impaired sensation      OT Treatment/Interventions:      OT Goals(Current goals can be found in the care plan section) Acute Rehab OT Goals Patient Stated Goal: To return home OT Goal Formulation: With patient Time For Goal Achievement: 08/31/19 Potential to Achieve Goals: Good  OT Frequency:     Barriers to D/C:            Co-evaluation              AM-PAC OT "6 Clicks" Daily Activity     Outcome Measure Help from another person eating meals?: None Help from another person taking care of personal grooming?: None Help from another person toileting, which includes using toliet, bedpan, or urinal?: None Help from another person bathing (including washing, rinsing, drying)?: A Little Help from another person to put on and taking off regular upper body clothing?: None Help from another person to put on and taking off regular lower body clothing?: None 6 Click Score: 23   End of Session Equipment Utilized During Treatment: Rolling walker;Oxygen(2L [place])  Activity Tolerance: Patient tolerated treatment well Patient left: in bed;with call bell/phone within reach  OT Visit Diagnosis: Other abnormalities of gait and mobility (R26.89);Hemiplegia and hemiparesis Hemiplegia - Right/Left: Left Hemiplegia - dominant/non-dominant: Dominant Hemiplegia - caused by: Cerebral infarction                Time: 4967-5916 OT Time Calculation (min): 18 min Charges:  OT General Charges $OT Visit: 1 Visit OT Evaluation $OT Eval Moderate Complexity: 1 Mod OT Treatments $Self Care/Home Management : 8-22 mins  Dessie Coma, M.S. OTR/L  08/17/19, 2:20 PM

## 2019-08-18 ENCOUNTER — Observation Stay: Payer: Medicaid Other

## 2019-08-18 DIAGNOSIS — I639 Cerebral infarction, unspecified: Secondary | ICD-10-CM | POA: Diagnosis not present

## 2019-08-18 LAB — BASIC METABOLIC PANEL
Anion gap: 6 (ref 5–15)
BUN: 10 mg/dL (ref 6–20)
CO2: 25 mmol/L (ref 22–32)
Calcium: 8.5 mg/dL — ABNORMAL LOW (ref 8.9–10.3)
Chloride: 98 mmol/L (ref 98–111)
Creatinine, Ser: 0.49 mg/dL (ref 0.44–1.00)
GFR calc Af Amer: >60 mL/min (ref >60)
GFR calc non Af Amer: >60 mL/min (ref >60)
Glucose, Bld: 129 mg/dL — ABNORMAL HIGH (ref 70–99)
Potassium: 4.1 mmol/L (ref 3.5–5.1)
Sodium: 129 mmol/L — ABNORMAL LOW (ref 135–145)

## 2019-08-18 LAB — OSMOLALITY, URINE: Osmolality, Ur: 590 mOsm/kg (ref 300–900)

## 2019-08-18 LAB — NA AND K (SODIUM & POTASSIUM), RAND UR
Potassium Urine: 55 mmol/L
Sodium, Ur: 167 mmol/L

## 2019-08-18 LAB — T4, FREE: Free T4: 0.94 ng/dL (ref 0.61–1.12)

## 2019-08-18 LAB — SODIUM: Sodium: 128 mmol/L — ABNORMAL LOW (ref 135–145)

## 2019-08-18 LAB — TSH: TSH: 6.889 u[IU]/mL — ABNORMAL HIGH (ref 0.350–4.500)

## 2019-08-18 LAB — OSMOLALITY: Osmolality: 270 mOsm/kg — ABNORMAL LOW (ref 275–295)

## 2019-08-18 MED ORDER — PANTOPRAZOLE SODIUM 40 MG PO TBEC
40.0000 mg | DELAYED_RELEASE_TABLET | Freq: Every day | ORAL | Status: DC
  Administered 2019-08-18: 40 mg via ORAL
  Filled 2019-08-18: qty 1

## 2019-08-18 MED ORDER — HYDROCODONE-ACETAMINOPHEN 5-325 MG PO TABS: 1.0000 | ORAL_TABLET | ORAL | 0 refills | Status: AC | PRN

## 2019-08-18 MED ORDER — MOMETASONE FURO-FORMOTEROL FUM 200-5 MCG/ACT IN AERO
2.0000 | INHALATION_SPRAY | Freq: Two times a day (BID) | RESPIRATORY_TRACT | Status: DC
  Administered 2019-08-18: 2 via RESPIRATORY_TRACT
  Filled 2019-08-18: qty 8.8

## 2019-08-18 MED ORDER — TIOTROPIUM BROMIDE MONOHYDRATE 18 MCG IN CAPS
1.0000 | ORAL_CAPSULE | Freq: Every day | RESPIRATORY_TRACT | Status: DC
  Administered 2019-08-18: 18 ug via RESPIRATORY_TRACT
  Filled 2019-08-18: qty 5

## 2019-08-18 MED ORDER — ASPIRIN EC 81 MG PO TBEC: 81.0000 mg | DELAYED_RELEASE_TABLET | Freq: Every day | ORAL | Status: AC

## 2019-08-18 MED ORDER — LORATADINE 10 MG PO TABS
10.0000 mg | ORAL_TABLET | Freq: Every day | ORAL | Status: DC
  Administered 2019-08-18: 10 mg via ORAL
  Filled 2019-08-18: qty 1

## 2019-08-18 MED ORDER — ONDANSETRON HCL 4 MG/2ML IJ SOLN
4.0000 mg | Freq: Four times a day (QID) | INTRAMUSCULAR | Status: DC | PRN
  Administered 2019-08-18: 07:00:00 4 mg via INTRAVENOUS
  Filled 2019-08-18: qty 2

## 2019-08-18 MED ORDER — CLOPIDOGREL BISULFATE 75 MG PO TABS: 75.0000 mg | ORAL_TABLET | Freq: Every day | ORAL | 0 refills | Status: AC

## 2019-08-18 MED ORDER — MONTELUKAST SODIUM 10 MG PO TABS: 10.0000 mg | ORAL_TABLET | Freq: Every day | ORAL | Status: DC

## 2019-08-18 NOTE — TOC Transition Note (Signed)
Transition of Care [facility]) - CM/SW Discharge Note   Patient Details  Name: Stephanie Ross MRN: [id number] Date of Birth: February 20, 1966  [facility]) CM/SW Contact:  Elease Hashimoto, LCSW Phone Number: 08/18/2019, 4:31 PM   Clinical Narrative:   Met with pt and daughter who is at bedside. Pt lives with daughter in a mobile home and has been independent prior to admission. She feels she is recovering from her CVA and is grateful for this. Have gotten a rw for her via [employer] and she already has home O2 via [place]. She will get tub bench on her own. Have contacted several Home health agencies and no one will take the PT referral. Made pt aware of this and offered her the option for OP. She wants to think about it and feels she can recover on her own. Her daughter will work with her also. She was not aware the MD has discharged her. No further follow due to discharge today. She reports she will receive medicare in july      Barriers to Discharge: No [facility] will accept this patient   Patient Goals and CMS Choice Patient states their goals for this hospitalization and ongoing recovery are:: I am glad I'm doing better and ready to go home CMS Medicare.gov Compare Post Acute Care list provided to:: Patient Choice offered to / list presented to : Patient  Discharge Placement                       Discharge Plan and Services In-house Referral: Clinical Social Work   Post Acute Care Choice: Durable Medical Equipment          DME Arranged: Gilford Rile rolling DME Agency: [employer] Date DME Agency Contacted: 08/18/19 Time DME Agency Contacted: [phone]              Social Determinants of Health (SDOH) Interventions     Readmission Risk Interventions No flowsheet data found.

## 2019-08-18 NOTE — Discharge Instructions (Signed)
Please have your PCP draw repeat labs (BMP) later this week, on Thursday or Friday. Your sodium is a little low, so should be rechecked soon.

## 2019-08-18 NOTE — TOC Progression Note (Signed)
Transition of Care [facility]) - Progression Note    Patient Details  Name: Stephanie Ross MRN: [id number] Date of Birth: 1965/10/16  [facility] CM/SW Contact  Lenette Rau, Gardiner Rhyme, LCSW Phone Number: 08/18/2019, 2:40 PM  Clinical Narrative: Attempted to see pt and she was sleeping and at peace. Will come back by later to evaluate pt and discuss discharge needs.           Expected Discharge Plan and Services                                                 Social Determinants of Health (SDOH) Interventions    Readmission Risk Interventions No flowsheet data found.

## 2019-08-18 NOTE — Progress Notes (Signed)
PT Cancellation Note  Patient Details Name: Stephanie Ross MRN: [id number] DOB: 03/09/1966   Cancelled Treatment:    Reason Eval/Treat Not Completed: Other (comment). Upon entering room, pt with RN at bedside, reported 12/10 headache. RN page neurologist. PT to follow up as able.   Lieutenant Diego PT, DPT 9:01 AM,08/18/19

## 2019-08-18 NOTE — Evaluation (Signed)
Physical Therapy Evaluation Patient Details Name: Stephanie Ross MRN: [id number] DOB: 1966/01/01 Today's Date: 08/18/2019   History of Present Illness  Pt is a 54 y.o. female  female with a known history of asthma, CHF, COPD and hypertension, who presented to the emergency room with the onset of headache with associated left sided numbness and weakness. 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    Clinical Impression  Pt alert, reported improvement in HA from earlier today, currently 2-3/10. Pt stated at baseline she lives with her daughter, normally I/modI for ADLs, IADLs. Pt denies falls.  Upon assessment the patient demonstrated UE and LE strength WFLs and symmetrical (cited chronic R rotator cuff condition). Gross UE and LE coordination WFLs, and pt reported no asymmetries with light touch sensation. Pt performed bed mobility independently, and good seated balance. Sit <> Stand with modI, pt able to perform without AD, but improved comfort and confidence with RW. The patient ambulated ~167ft with RW, progressed to ModI as well. PT and pt discussed importance of continued mobility, activity modification to increase safety, and potential benefit of rollator as well as tub rail.  Overall the patient demonstrated mild deficits (see "PT Problem List") that impede the patient's functional abilities, safety, and mobility and would benefit from skilled PT intervention. Recommendation is HHPT.      Follow Up Recommendations Home health PT    Equipment Recommendations  Other (comment);3in1 (PT)(rollator)    Recommendations for Other Services       Precautions / Restrictions Precautions Precautions: None Restrictions Weight Bearing  Restrictions: No      Mobility  Bed Mobility Overal bed mobility: Independent                Transfers Overall transfer level: Modified independent Equipment used: Rolling walker (2 wheeled)             General transfer comment: CGA to mod I at end of session  Ambulation/Gait Ambulation/Gait assistance: Modified independent (Device/Increase time) Gait Distance (Feet): 180 Feet       Gait velocity interpretation: >2.62 ft/sec, indicative of community ambulatory General Gait Details: initally CGA for safety, modI at end of ambulation. Pt initally cued for RW placement and positioning  Stairs            Wheelchair Mobility    Modified Rankin (Stroke Patients Only)       Balance Overall balance assessment: Needs assistance Sitting-balance support: Feet supported Sitting balance-Leahy Scale: Normal       Standing balance-Leahy Scale: Good Standing balance comment: pt much more comfortable with RW use                             Pertinent Vitals/Pain Pain Assessment: 0-10 Pain Score: 3  Pain Location: headache Pain Descriptors / Indicators: Aching Pain Intervention(s): Limited activity within patient's tolerance;Monitored during session;Repositioned;Premedicated before session    Home Living Family/patient expects to be discharged to:: Private residence Living Arrangements: Children Available Help at Discharge: Family;Available 24 hours/day Type of Home: Mobile home Home Access: Stairs to enter Entrance Stairs-Rails: Right;Left;Can reach both Entrance Stairs-Number of Steps: 6-7 Home Layout: One level Home Equipment: Cane - single point      Prior Function Level of Independence: Independent with  assistive device(s)         Comments: Pt reports using SPC for community mobility PRN      Hand Dominance   Dominant Hand: Left    Extremity/Trunk Assessment   Upper Extremity Assessment Upper Extremity Assessment: LUE  deficits/detail;RUE deficits/detail RUE Deficits / Details: grossly 4+/5, pt reported chronic rotator cuff condition RUE Sensation: WNL RUE Coordination: (gross motor WFLs) LUE Deficits / Details: grossly 4+/5 LUE Sensation: WNL LUE Coordination: (gross motor WFLs)    Lower Extremity Assessment Lower Extremity Assessment: RLE deficits/detail;LLE deficits/detail RLE Deficits / Details: grossly 4+/5 RLE Sensation: WNL RLE Coordination: WNL LLE Deficits / Details: grossly 4+/5 LLE Sensation: WNL LLE Coordination: WNL    Cervical / Trunk Assessment Cervical / Trunk Assessment: Normal  Communication   Communication: No difficulties  Cognition Arousal/Alertness: Awake/alert Behavior During Therapy: WFL for tasks assessed/performed Overall Cognitive Status: Within Functional Limits for tasks assessed                                        General Comments General comments (skin integrity, edema, etc.): spO2 on 2L WFLs throughout mobility    Exercises     Assessment/Plan    PT Assessment Patient needs continued PT services  PT Problem List Decreased activity tolerance;Decreased mobility;Decreased knowledge of use of DME       PT Treatment Interventions DME instruction;Balance training;Gait training;Neuromuscular re-education;Stair training;Functional mobility training;Patient/family education;Therapeutic activities;Therapeutic exercise    PT Goals (Current goals can be found in the Care Plan section)  Acute Rehab PT Goals Patient Stated Goal: to improve her balance PT Goal Formulation: With patient Time For Goal Achievement: 09/01/19 Potential to Achieve Goals: Good    Frequency 7X/week   Barriers to discharge        Co-evaluation               AM-PAC PT "6 Clicks" Mobility  Outcome Measure Help needed turning from your back to your side while in a flat bed without using bedrails?: None Help needed moving from lying on your back to sitting  on the side of a flat bed without using bedrails?: None Help needed moving to and from a bed to a chair (including a wheelchair)?: None Help needed standing up from a chair using your arms (e.g., wheelchair or bedside chair)?: None Help needed to walk in hospital room?: None Help needed climbing 3-5 steps with a railing? : A Little 6 Click Score: 23    End of Session Equipment Utilized During Treatment: Gait belt Activity Tolerance: Patient tolerated treatment well Patient left: in bed;with call bell/phone within reach Nurse Communication: Mobility status PT Visit Diagnosis: Other abnormalities of gait and mobility (R26.89)    Time: 7824-2353 PT Time Calculation (min) (ACUTE ONLY): 31 min   Charges:   PT Evaluation $PT Eval Low Complexity: 1 Low PT Treatments $Therapeutic Exercise: 8-22 mins        Lieutenant Diego PT, DPT 2:31 PM,08/18/19

## 2019-08-18 NOTE — Progress Notes (Signed)
Subjective: Patient today complains of headache.  Reports that she had a headache with the onset of her symptoms prior to presentation.  Headache resolved not long after presentation but recurred.  Took Ultram with worsening of headache.  Rated headache at a 12/10 this morning.  Was administered Norco with resolution of headache.    Objective: Current vital signs: BP (!) 141/54   Pulse (!) 56   Temp 98.1 F (36.7 C)   Resp 13   Ht 5\' 2"  (1.575 m)   Wt 74.8 kg   SpO2 100%   BMI 30.18 kg/m  Vital signs in last 24 hours: Temp:  [97.8 F (36.6 C)-98.5 F (36.9 C)] 98.1 F (36.7 C) (05/24 0738) Pulse Rate:  [56-72] 56 (05/24 1000) Resp:  [12-18] 13 (05/24 1000) BP: (122-141)/(48-67) 141/54 (05/24 0900) SpO2:  [91 %-100 %] 100 % (05/24 1000)  Intake/Output from previous day: 05/23 0701 - 05/24 0700 In: 619.8 [P.O.:120; I.V.:499.8] Out: 600 [Urine:500; Emesis/NG output:100] Intake/Output this shift: No intake/output data recorded. Nutritional status:  Diet Order            Diet Heart Room service appropriate? Yes; Fluid consistency: Thin  Diet effective now              Neurologic Exam: Mental Status: Alert, oriented, thought content appropriate.  Speech fluent without evidence of aphasia.  Able to follow 3 step commands without difficulty. Cranial Nerves: II: Visual fields grossly normal, pupils equal, round, reactive to light and accommodation III,IV, VI: ptosis not present, extra-ocular motions intact bilaterally V,VII: smile symmetric, facial light touch sensation normal bilaterally VIII: hearing normal bilaterally IX,X: gag reflex present XI: bilateral shoulder shrug XII: midline tongue extension Motor: Right :  Upper extremity   5-/5                                      Left:     Upper extremity   5/5             Lower extremity   5/5                                                   Lower extremity   5/5 Tone and bulk:normal tone throughout; no atrophy  noted Sensory: Pinprick and light touch diminished LUE and LLE   Lab Results: Basic Metabolic Panel: Recent Labs  Lab 08/16/19 2233 08/18/19 0454  NA 132* 129*  K 3.7 4.1  CL 97* 98  CO2 27 25  GLUCOSE 114* 129*  BUN 13 10  CREATININE 0.72 0.49  CALCIUM 8.9 8.5*    Liver Function Tests: No results for input(s): AST, ALT, ALKPHOS, BILITOT, PROT, ALBUMIN in the last 168 hours. No results for input(s): LIPASE, AMYLASE in the last 168 hours. No results for input(s): AMMONIA in the last 168 hours.  CBC: Recent Labs  Lab 08/16/19 2233  WBC 7.7  NEUTROABS 3.2  HGB 13.0  HCT 36.0  MCV 80.9  PLT 94*    Cardiac Enzymes: No results for input(s): CKTOTAL, CKMB, CKMBINDEX, TROPONINI in the last 168 hours.  Lipid Panel: Recent Labs  Lab 08/17/19 0526  CHOL 129  TRIG 56  HDL 62  CHOLHDL 2.1  VLDL 11  LDLCALC 56  CBG: No results for input(s): GLUCAP in the last 168 hours.  Microbiology: Results for orders placed or performed during the hospital encounter of 08/16/19  SARS Coronavirus 2 by RT PCR (hospital order, performed in [facility] hospital lab) Nasopharyngeal Nasopharyngeal Swab     Status: None   Collection Time: 08/17/19  2:18 AM   Specimen: Nasopharyngeal Swab  Result Value Ref Range Status   SARS Coronavirus 2 NEGATIVE NEGATIVE Final    Comment: (NOTE) SARS-CoV-2 target nucleic acids are NOT DETECTED. The SARS-CoV-2 RNA is generally detectable in upper and lower respiratory specimens during the acute phase of infection. The lowest concentration of SARS-CoV-2 viral copies this assay can detect is 250 copies / mL. A negative result does not preclude SARS-CoV-2 infection and should not be used as the sole basis for treatment or other patient management decisions.  A negative result may occur with improper specimen collection / handling, submission of specimen other than nasopharyngeal swab, presence of viral mutation(s) within the areas targeted by  this assay, and inadequate number of viral copies (<250 copies / mL). A negative result must be combined with clinical observations, patient history, and epidemiological information. Fact Sheet for Patients:   StrictlyIdeas.no Fact Sheet for Healthcare Providers: BankingDealers.co.za This test is not yet approved or cleared  by the [place] FDA and has been authorized for detection and/or diagnosis of SARS-CoV-2 by FDA under an Emergency Use Authorization (EUA).  This EUA will remain in effect (meaning this test can be used) for the duration of the COVID-19 declaration under Section 564(b)(1) of the Act, 21 U.S.C. section 360bbb-3(b)(1), unless the authorization is terminated or revoked sooner. Performed at [facility], [address] [place], [place] [address]     Coagulation Studies: No results for input(s): LABPROT, INR in the last 72 hours.  Imaging: CT Head Wo Contrast  Result Date: 08/16/2019 CLINICAL DATA:  Headache and left-sided numbness for 1 day EXAM: CT HEAD WITHOUT CONTRAST TECHNIQUE: Contiguous axial images were obtained from the base of the skull through the vertex without intravenous contrast. COMPARISON:  None. FINDINGS: Brain: No acute infarct or hemorrhage. Lateral ventricles and midline structures are unremarkable. No acute extra-axial fluid collections. No mass effect. Vascular: No hyperdense vessel or unexpected calcification. Skull: Normal. Negative for fracture or focal lesion. Sinuses/Orbits: No acute finding. Other: None. IMPRESSION: Normal CT head without contrast. Electronically Signed   By: Randa Ngo M.D.   On: 08/16/2019 23:16   MR BRAIN WO CONTRAST  Result Date: 08/17/2019 CLINICAL DATA:  Initial evaluation for acute headache with left-sided numbness. EXAM: MRI HEAD WITHOUT CONTRAST TECHNIQUE: Multiplanar, multiecho pulse sequences of the brain and surrounding structures were obtained  without intravenous contrast. COMPARISON:  Prior head CT from 08/16/2019. FINDINGS: Brain: Cerebral volume within normal limits for age. Minimal scattered cerebral white matter changes for age. Probable tiny remote right cerebellar infarct noted (series 15, image 15). Punctate 3 mm focus of restricted diffusion seen involving the dorsal right medulla, consistent with a small acute ischemic infarct (series 5, image 9). No associated hemorrhage or mass effect. No other evidence for acute or subacute ischemia. Gray-white matter differentiation otherwise maintained. No encephalomalacia to suggest chronic cortical infarction elsewhere within the brain. No foci of susceptibility artifact to suggest acute or chronic intracranial hemorrhage. No mass lesion, midline shift or mass effect. No hydrocephalus or extra-axial fluid collection. Pituitary gland suprasellar region normal. Midline structures intact. Vascular: Major intracranial vascular flow voids are maintained. Skull and upper cervical  spine: Craniocervical junction normal. Bone marrow signal intensity within normal limits. No scalp soft tissue abnormality. Sinuses/Orbits: Globes and orbital soft tissues within normal limits. Mild scattered mucosal thickening noted within the ethmoidal air cells and maxillary sinuses. Small right maxillary sinus retention cyst noted. No significant mastoid effusion. Inner ear structures normal. Other: None. IMPRESSION: 1. 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 2. 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 3. Minimal cerebral white matter changes for age, nonspecific, but most commonly related to chronic small vessel ischemic disease. Electronically Signed   By: Jeannine Boga M.D.   On: 08/17/2019 01:07   US Carotid Bilateral (at [facility] and [facility] only)  Result Date: 08/18/2019 CLINICAL DATA:  CVA. Left-sided numbness and weakness. History of hypertension. Former  smoker. EXAM: BILATERAL CAROTID DUPLEX ULTRASOUND TECHNIQUE: Pearline Cables scale imaging, color Doppler and duplex ultrasound were performed of bilateral carotid and vertebral arteries in the neck. COMPARISON:  None. FINDINGS: Criteria: Quantification of carotid stenosis is based on velocity parameters that correlate the residual internal carotid diameter with NASCET-based stenosis levels, using the diameter of the distal internal carotid lumen as the denominator for stenosis measurement. The following velocity measurements were obtained: RIGHT ICA: 76/19 cm/sec CCA: 17/5 cm/sec SYSTOLIC ICA/CCA RATIO:  1.1 ECA: 88 cm/sec LEFT ICA: 85/20 cm/sec CCA: 10/2 cm/sec SYSTOLIC ICA/CCA RATIO:  1.4 ECA: 78 cm/sec RIGHT CAROTID ARTERY: There is a minimal amount of eccentric echogenic plaque within the right carotid bulb (image 15), extending to involve the origin and proximal aspects of the right internal carotid artery (image 22), not resulting in elevated peak systolic velocities within the interrogated course of the right internal carotid artery to suggest a hemodynamically significant stenosis. RIGHT VERTEBRAL ARTERY:  Antegrade flow LEFT CAROTID ARTERY: There is a minimal amount of eccentric echogenic plaque within the left carotid bulb (image 48), extending to involve the origin and proximal aspects of the left internal carotid artery (image 55), not resulting in elevated peak systolic velocities within the interrogated course of the left internal carotid artery to suggest a hemodynamically significant stenosis. LEFT VERTEBRAL ARTERY:  Antegrade flow IMPRESSION: Minimal amount of bilateral atherosclerotic plaque, not resulting in a hemodynamically significant stenosis within either internal carotid artery. Electronically Signed   By: Sandi Mariscal M.D.   On: 08/18/2019 07:45   ECHOCARDIOGRAM COMPLETE  Result Date: 08/17/2019    ECHOCARDIOGRAM REPORT   Patient Name:   ROSARIO DUEY Date of Exam: 08/17/2019 Medical Rec #:   [id number]          Height:       62.0 in Accession #:    [id number]         Weight:       165.0 lb Date of Birth:  30-Jan-1966         BSA:          1.762 m Patient Age:    5 years           BP:           125/55 mmHg Patient Gender: F                  HR:           69 bpm. Exam Location:  [facility] Procedure: 2D Echo, Cardiac Doppler and Color Doppler Indications:     Stroke 434.91  History:         Patient has no prior history of Echocardiogram examinations.  CHF, COPD; Risk Factors:Hypertension.  Sonographer:     Sherrie Sport RDCS (AE) Referring Phys:  [id number] Arvella Merles MANSY Diagnosing Phys: Kate Sable MD  Sonographer Comments: No parasternal window. IMPRESSIONS  1. Left ventricular ejection fraction, by estimation, is 60 to 65%. The left ventricle has normal function. The left ventricle has no regional wall motion abnormalities. Left ventricular diastolic parameters were normal.  2. Right ventricular systolic function is normal. The right ventricular size is normal. There is normal pulmonary artery systolic pressure.  3. The mitral valve is normal in structure. No evidence of mitral valve regurgitation. No evidence of mitral stenosis.  4. The aortic valve was not well visualized. Aortic valve regurgitation is moderate. Mild aortic valve stenosis.  5. Pulmonic valve regurgitation not assessed.  6. The inferior vena cava is normal in size with greater than 50% respiratory variability, suggesting right atrial pressure of 3 mmHg. FINDINGS  Left Ventricle: Left ventricular ejection fraction, by estimation, is 60 to 65%. The left ventricle has normal function. The left ventricle has no regional wall motion abnormalities. The left ventricular internal cavity size was normal in size. There is  no left ventricular hypertrophy. Left ventricular diastolic parameters were normal. Right Ventricle: The right ventricular size is normal. No increase in right ventricular wall thickness. Right ventricular systolic  function is normal. There is normal pulmonary artery systolic pressure. The tricuspid regurgitant velocity is 1.52 m/s, and  with an assumed right atrial pressure of 3 mmHg, the estimated right ventricular systolic pressure is 40.8 mmHg. Left Atrium: Left atrial size was normal in size. Right Atrium: Right atrial size was normal in size. Pericardium: There is no evidence of pericardial effusion. Mitral Valve: The mitral valve is normal in structure. Normal mobility of the mitral valve leaflets. No evidence of mitral valve regurgitation. No evidence of mitral valve stenosis. Tricuspid Valve: The tricuspid valve is normal in structure. Tricuspid valve regurgitation is not demonstrated. No evidence of tricuspid stenosis. Aortic Valve: The aortic valve was not well visualized. Aortic valve regurgitation is moderate. Aortic regurgitation PHT measures 484 msec. Mild aortic stenosis is present. Aortic valve mean gradient measures 16.7 mmHg. Aortic valve peak gradient measures 27.3 mmHg. Aortic valve area, by VTI measures 1.17 cm. Pulmonic Valve: The pulmonic valve was not well visualized. Pulmonic valve regurgitation not assessed. No evidence of pulmonic stenosis. Aorta: The aortic root is normal in size and structure. Venous: The inferior vena cava is normal in size with greater than 50% respiratory variability, suggesting right atrial pressure of 3 mmHg. IAS/Shunts: No atrial level shunt detected by color flow Doppler.  LEFT VENTRICLE PLAX 2D LVIDd:         4.81 cm  Diastology LVIDs:         2.95 cm  LV e' lateral:   11.70 cm/s LV PW:         1.11 cm  LV E/e' lateral: 6.4 LV IVS:        0.81 cm  LV e' medial:    6.20 cm/s LVOT diam:     2.00 cm  LV E/e' medial:  12.2 LV SV:         72 LV SV Index:   41 LVOT Area:     3.14 cm  RIGHT VENTRICLE RV Basal diam:  2.75 cm RV S prime:     14.50 cm/s TAPSE (M-mode): 2.8 cm LEFT ATRIUM             Index  RIGHT ATRIUM           Index LA diam:        2.90 cm 1.65 cm/m  RA  Area:     11.70 cm LA Vol (A2C):   38.8 ml 22.03 ml/m RA Volume:   29.00 ml  16.46 ml/m LA Vol (A4C):   26.7 ml 15.16 ml/m LA Biplane Vol: 32.7 ml 18.56 ml/m  AORTIC VALVE                    PULMONIC VALVE AV Area (Vmax):    1.23 cm     PV Vmax:        0.83 m/s AV Area (Vmean):   1.21 cm     PV Peak grad:   2.7 mmHg AV Area (VTI):     1.17 cm     RVOT Peak grad: 12 mmHg AV Vmax:           261.33 cm/s AV Vmean:          190.667 cm/s AV VTI:            0.614 m AV Peak Grad:      27.3 mmHg AV Mean Grad:      16.7 mmHg LVOT Vmax:         102.00 cm/s LVOT Vmean:        73.400 cm/s LVOT VTI:          0.229 m LVOT/AV VTI ratio: 0.37 AI PHT:            484 msec MITRAL VALVE               TRICUSPID VALVE MV Area (PHT): 4.08 cm    TR Peak grad:   9.2 mmHg MV Decel Time: 186 msec    TR Vmax:        152.00 cm/s MV E velocity: 75.40 cm/s MV A velocity: 85.70 cm/s  SHUNTS MV E/A ratio:  0.88        Systemic VTI:  0.23 m                            Systemic Diam: 2.00 cm Kate Sable MD Electronically signed by Kate Sable MD Signature Date/Time: 08/17/2019/1:21:12 PM    Final     Medications:  I have reviewed the patient's current medications. Scheduled: . amLODipine  5 mg Oral Daily  . atorvastatin  20 mg Oral Daily  . chlorhexidine  15 mL Mouth Rinse BID  . enoxaparin (LOVENOX) injection  40 mg Subcutaneous Q24H  . fluticasone  2 spray Each Nare Daily  . loratadine  10 mg Oral Daily  . mouth rinse  15 mL Mouth Rinse q12n4p  . mometasone-formoterol  2 puff Inhalation BID  . montelukast  10 mg Oral QHS  . pantoprazole  40 mg Oral Daily  . tiotropium  1 capsule Inhalation Daily  . [START ON 08/23/2019] Vitamin D (Ergocalciferol)  50,000 Units Oral Weekly    Assessment/Plan: 54 y.o. female femalewith a known history of asthma, CHF, COPD and hypertension, who presented to the emergency room with the onset of headache with associated left sided numbnessand weakness. 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 Carotid dopplers show no  evidence of hemodynamically significant stenosis.  Echocardiogram shows no cardiac source of emboli with an EF of 60-65%.  BP controlled.  A1c 5.6, LDL 56.  Recommendations: 1. Patient with worsening headache and new mild RUE weakness.  Will re-image with head CT without contrast.  Headache responding to Norco.  May continue prn 2. Dual antiplatelet therapy with ASA 81mg  and Plavix 75mg  for three weeks with change to ASA 81mg  daily alone as monotherapy after that time.  Continue statin. 3. Follow up with neurology on an outpatient basis after discharge.      LOS: 0 days   Alexis Goodell, MD Neurology [phone] 08/18/2019  10:39 AM

## 2019-08-18 NOTE — Discharge Summary (Signed)
Physician Discharge Summary  Stephanie Ross NOM:[id number] DOB: [birth year] DOA: 08/16/2019  PCP: [facility] date: 08/16/2019 Discharge date: 08/18/2019  Admitted From: home Disposition:  home  Recommendations for Outpatient Follow-up:  1. Follow up with PCP in 1-2 weeks 2. Please obtain BMP/CBC later this week (follow up on sodium level) 3. Please follow up with neurology for follow up regarding your stroke.   Referral placed to Dr. Melrose Nakayama.  Home Health: PT  Equipment/Devices: None   Discharge Condition: Stable  CODE STATUS: Full  Diet recommendation: Heart Healthy   Discharge Diagnoses: Active Problems:   CVA (cerebral vascular accident) [facility])    Summary of HPI and Hospital Course:   Stephanie Ross  is a 54 y.o. Caucasian female with a known history of asthma, CHF, COPD and hypertension, who presented to the emergency room with the onset of headache with associated left sided numbness and weakness.  She was noted to have asymmetric pupils with more dilated pupil on the left. She admitted to imbalance and unsteady gait.  She denies any other neurological deficits.  No chest pain or dyspnea or palpitations.  No cough beyond her normal for COPD.  No fever or chills.  No nausea or vomiting or abdominal pain.  No dysuria, oliguria or hematuria or flank pain.  No new urinary or stool incontinence.  Upon presentation to the emergency room, pulse confused 99% on 2 L O2 by nasal cannula and otherwise vital signs were within normal.  Labs revealed mild hyponatremia otherwise CMP and CBC were unremarkable.  High-sensitivity troponin I was 5.  UA was negative and EKG showed no sinus rhythm rate 69 with left atrial enlargement and borderline intraventricular conduction delay with poor R wave progression.  Noncontrasted CT scan was negative.  Brain MRI without contrast revealed punctate 3 mm acute ischemic nonhemorrhagic infarct involving the dorsal right  medulla with probable small remote right cerebellar infarct and mild cerebral white matter changes that are nonspecific most commonly related to chronic small vessel ischemic disease.  The patient was given 4 baby aspirin and admitted for further evaluation and management.  Patient was evaluated by SLP, PT and OT.  Home health PT was recommended, but no other needs identified.    Neurology recommends 3 weeks of DAPT with aspirin and Plavix, followed by aspirin indefinitely, in addition to statin.  Patient had significant headaches post-stroke, and reports a history of migraines in the past, but none prior to the stroke for a long time.  Headache relieved by Norco.  Given severity and recurring nature of her headaches, a repeat head CT was obtained that showed no changes and no evidence of hemorrhage.  She used to be on Topamax for migraine prevention, but was taken off and not having headaches.  We discussed she may require prevention medication again, if her stroke causes her to again have frequent migraines.  Patient's sodium decreased somewhat and was mildly low on admission.  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  Her TSH was mildly elevated and free T4 is pending, so hypothyroidism is also possible.  Recommended patient have labs repeated outpatient later this week.   Discharge Instructions   Discharge Instructions    Call MD for:   Complete by: As directed    New or worsening neurologic symptoms - weakness, numbness, trouble speaking or  understanding others, trouble swallowing or difficulty with balance / coordination.   Call MD for:  extreme fatigue   Complete by: As directed    Call MD for:  persistant dizziness or light-headedness   Complete by: As directed    Call MD for:  persistant nausea and vomiting   Complete by: As directed     Call MD for:  severe uncontrolled pain   Complete by: As directed    Call MD for:  temperature >100.4   Complete by: As directed    Diet - low sodium heart healthy   Complete by: As directed    Discharge instructions   Complete by: As directed    Use Norco as needed for headaches.  If you continue having headaches, your primary care doctor can prescribe more pain medicine.  If you again start having frequent migraines, please talk to primary care about getting on prevention medicine again (Topamax or similar).   Increase activity slowly   Complete by: As directed      Allergies as of 08/18/2019      Reactions   Amoxicillin Other (See Comments)   Has patient had a PCN reaction causing immediate rash, facial/tongue/throat swelling, SOB or lightheadedness with hypotension: Yes Has patient had a PCN reaction causing severe rash involving mucus membranes or skin necrosis: Yes Has patient had a PCN reaction that required hospitalization:yes Has patient had a PCN reaction occurring within the last 10 years: yes If all of the above answers are "NO", then may proceed with Cephalosporin use.   Prednisone Other (See Comments)   Full body rash       Medication List    TAKE these medications   albuterol 108 (90 Base) MCG/ACT inhaler Commonly known as: VENTOLIN HFA Inhale 2 puffs into the lungs every 4 (four) hours as needed for wheezing or shortness of breath.   amLODipine 5 MG tablet Commonly known as: NORVASC Take 5 mg by mouth at bedtime.   aspirin EC 81 MG tablet Take 1 tablet (81 mg total) by mouth daily.   atorvastatin 20 MG tablet Commonly known as: LIPITOR Take 20 mg by mouth daily.   budesonide-formoterol 160-4.5 MCG/ACT inhaler Commonly known as: SYMBICORT Inhale 2 puffs into the lungs 2 (two) times daily.   celecoxib 200 MG capsule Commonly known as: CELEBREX Take 200 mg by mouth daily.   cetirizine 10 MG tablet Commonly known as: ZYRTEC Take 10 mg by mouth daily  as needed.   clopidogrel 75 MG tablet Commonly known as: Plavix Take 1 tablet (75 mg total) by mouth daily for 21 days.   CoQ10 200 MG Caps Take 1 capsule by mouth daily.   ergocalciferol 1.25 MG (50000 UT) capsule Commonly known as: VITAMIN D2 Take 50,000 Units by mouth once a week.   fluticasone 50 MCG/ACT nasal spray Commonly known as: FLONASE Place 2 sprays into both nostrils daily.   HYDROcodone-acetaminophen 5-325 MG tablet Commonly known as: NORCO/VICODIN Take 1 tablet by mouth every 4 (four) hours as needed for up to 3 days for severe pain (headache).   montelukast 10 MG tablet Commonly known as: SINGULAIR Take 10 mg by mouth at bedtime.   omeprazole 20 MG capsule Commonly known as: PRILOSEC Take 20 mg by mouth every morning.   Spiriva HandiHaler 18 MCG inhalation capsule Generic drug: tiotropium 1 capsule daily.      Follow-up Information    [facility] Schedule an appointment as soon as possible for  a visit in 1 week(s).   Contact information: [address] [place] [address] [phone]        Anabel Bene, MD. Schedule an appointment as soon as possible for a visit.   Specialty: Neurology Why: stroke follow up.  schedule as soon as possible, preferrably in 2-3 weeks Contact information: [address] [place] [address] [phone]          Allergies  Allergen Reactions  . Amoxicillin Other (See Comments)    Has patient had a PCN reaction causing immediate rash, facial/tongue/throat swelling, SOB or lightheadedness with hypotension: Yes Has patient had a PCN reaction causing severe rash involving mucus membranes or skin necrosis: Yes Has patient had a PCN reaction that required hospitalization:yes Has patient had a PCN reaction occurring within the last 10 years: yes If all of the above answers are "NO", then may proceed with Cephalosporin use.   . Prednisone Other (See Comments)    Full body rash      Consultations:  Neurology    Procedures/Studies: CT HEAD WO CONTRAST  Result Date: 08/18/2019 CLINICAL DATA:  Headache.  Reported recent left-sided numbness EXAM: CT HEAD WITHOUT CONTRAST TECHNIQUE: Contiguous axial images were obtained from the base of the skull through the vertex without intravenous contrast. COMPARISON:  Aug 16, 2019 FINDINGS: Brain: Ventricles and sulci are normal in size and configuration. There is no intracranial mass, hemorrhage, extra-axial fluid collection, or midline shift. Brain parenchyma appears unremarkable. No acute infarct evident. Vascular: No hyperdense vessel. There is calcification in each carotid siphon region. Skull: The bony calvarium appears intact. Sinuses/Orbits: There is mucosal thickening in several ethmoid air cells. Visualized paranasal sinuses otherwise are clear. Visualized orbits appear symmetric bilaterally. Other: Mastoid air cells are clear. IMPRESSION: Brain parenchyma appears unremarkable.  No mass or hemorrhage. There are foci of arterial vascular calcification. There is mucosal thickening in several ethmoid air cells. Electronically Signed   By: Lowella Grip III M.D.   On: 08/18/2019 11:42   CT Head Wo Contrast  Result Date: 08/16/2019 CLINICAL DATA:  Headache and left-sided numbness for 1 day EXAM: CT HEAD WITHOUT CONTRAST TECHNIQUE: Contiguous axial images were obtained from the base of the skull through the vertex without intravenous contrast. COMPARISON:  None. FINDINGS: Brain: No acute infarct or hemorrhage. Lateral ventricles and midline structures are unremarkable. No acute extra-axial fluid collections. No mass effect. Vascular: No hyperdense vessel or unexpected calcification. Skull: Normal. Negative for fracture or focal lesion. Sinuses/Orbits: No acute finding. Other: None. IMPRESSION: Normal CT head without contrast. Electronically Signed   By: Randa Ngo M.D.   On: 08/16/2019 23:16   MR BRAIN WO CONTRAST  Result  Date: 08/17/2019 CLINICAL DATA:  Initial evaluation for acute headache with left-sided numbness. EXAM: MRI HEAD WITHOUT CONTRAST TECHNIQUE: Multiplanar, multiecho pulse sequences of the brain and surrounding structures were obtained without intravenous contrast. COMPARISON:  Prior head CT from 08/16/2019. FINDINGS: Brain: Cerebral volume within normal limits for age. Minimal scattered cerebral white matter changes for age. Probable tiny remote right cerebellar infarct noted (series 15, image 15). Punctate 3 mm focus of restricted diffusion seen involving the dorsal right medulla, consistent with a small acute ischemic infarct (series 5, image 9). No associated hemorrhage or mass effect. No other evidence for acute or subacute ischemia. Gray-white matter differentiation otherwise maintained. No encephalomalacia to suggest chronic cortical infarction elsewhere within the brain. No foci of susceptibility artifact to suggest acute or chronic intracranial hemorrhage. No mass lesion, midline  shift or mass effect. No hydrocephalus or extra-axial fluid collection. Pituitary gland suprasellar region normal. Midline structures intact. Vascular: Major intracranial vascular flow voids are maintained. Skull and upper cervical spine: Craniocervical junction normal. Bone marrow signal intensity within normal limits. No scalp soft tissue abnormality. Sinuses/Orbits: Globes and orbital soft tissues within normal limits. Mild scattered mucosal thickening noted within the ethmoidal air cells and maxillary sinuses. Small right maxillary sinus retention cyst noted. No significant mastoid effusion. Inner ear structures normal. Other: None. IMPRESSION: 1. 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 2. 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 3. Minimal cerebral white matter changes for age, nonspecific, but most commonly related to chronic small vessel ischemic disease.  Electronically Signed   By: Jeannine Boga M.D.   On: 08/17/2019 01:07   US Carotid Bilateral (at [facility] and [facility] only)  Result Date: 08/18/2019 CLINICAL DATA:  CVA. Left-sided numbness and weakness. History of hypertension. Former smoker. EXAM: BILATERAL CAROTID DUPLEX ULTRASOUND TECHNIQUE: Pearline Cables scale imaging, color Doppler and duplex ultrasound were performed of bilateral carotid and vertebral arteries in the neck. COMPARISON:  None. FINDINGS: Criteria: Quantification of carotid stenosis is based on velocity parameters that correlate the residual internal carotid diameter with NASCET-based stenosis levels, using the diameter of the distal internal carotid lumen as the denominator for stenosis measurement. The following velocity measurements were obtained: RIGHT ICA: 76/19 cm/sec CCA: 62/6 cm/sec SYSTOLIC ICA/CCA RATIO:  1.1 ECA: 88 cm/sec LEFT ICA: 85/20 cm/sec CCA: 94/8 cm/sec SYSTOLIC ICA/CCA RATIO:  1.4 ECA: 78 cm/sec RIGHT CAROTID ARTERY: There is a minimal amount of eccentric echogenic plaque within the right carotid bulb (image 15), extending to involve the origin and proximal aspects of the right internal carotid artery (image 22), not resulting in elevated peak systolic velocities within the interrogated course of the right internal carotid artery to suggest a hemodynamically significant stenosis. RIGHT VERTEBRAL ARTERY:  Antegrade flow LEFT CAROTID ARTERY: There is a minimal amount of eccentric echogenic plaque within the left carotid bulb (image 48), extending to involve the origin and proximal aspects of the left internal carotid artery (image 55), not resulting in elevated peak systolic velocities within the interrogated course of the left internal carotid artery to suggest a hemodynamically significant stenosis. LEFT VERTEBRAL ARTERY:  Antegrade flow IMPRESSION: Minimal amount of bilateral atherosclerotic plaque, not resulting in a hemodynamically significant stenosis within either internal  carotid artery. Electronically Signed   By: Sandi Mariscal M.D.   On: 08/18/2019 07:45   ECHOCARDIOGRAM COMPLETE  Result Date: 08/17/2019    ECHOCARDIOGRAM REPORT   Patient Name:   ELLICE BOULTINGHOUSE Date of Exam: 08/17/2019 Medical Rec #:  [id number]          Height:       62.0 in Accession #:    [id number]         Weight:       165.0 lb Date of Birth:  [birth year]         BSA:          1.762 m Patient Age:    33 years           BP:           125/55 mmHg Patient Gender: F                  HR:           69 bpm. Exam Location:  [facility] Procedure: 2D Echo, Cardiac  Doppler and Color Doppler Indications:     Stroke 434.91  History:         Patient has no prior history of Echocardiogram examinations.                  CHF, COPD; Risk Factors:Hypertension.  Sonographer:     Sherrie Sport RDCS (AE) Referring Phys:  [id number] Arvella Merles MANSY Diagnosing Phys: Kate Sable MD  Sonographer Comments: No parasternal window. IMPRESSIONS  1. Left ventricular ejection fraction, by estimation, is 60 to 65%. The left ventricle has normal function. The left ventricle has no regional wall motion abnormalities. Left ventricular diastolic parameters were normal.  2. Right ventricular systolic function is normal. The right ventricular size is normal. There is normal pulmonary artery systolic pressure.  3. The mitral valve is normal in structure. No evidence of mitral valve regurgitation. No evidence of mitral stenosis.  4. The aortic valve was not well visualized. Aortic valve regurgitation is moderate. Mild aortic valve stenosis.  5. Pulmonic valve regurgitation not assessed.  6. The inferior vena cava is normal in size with greater than 50% respiratory variability, suggesting right atrial pressure of 3 mmHg. FINDINGS  Left Ventricle: Left ventricular ejection fraction, by estimation, is 60 to 65%. The left ventricle has normal function. The left ventricle has no regional wall motion abnormalities. The left ventricular internal cavity size was  normal in size. There is  no left ventricular hypertrophy. Left ventricular diastolic parameters were normal. Right Ventricle: The right ventricular size is normal. No increase in right ventricular wall thickness. Right ventricular systolic function is normal. There is normal pulmonary artery systolic pressure. The tricuspid regurgitant velocity is 1.52 m/s, and  with an assumed right atrial pressure of 3 mmHg, the estimated right ventricular systolic pressure is 73.2 mmHg. Left Atrium: Left atrial size was normal in size. Right Atrium: Right atrial size was normal in size. Pericardium: There is no evidence of pericardial effusion. Mitral Valve: The mitral valve is normal in structure. Normal mobility of the mitral valve leaflets. No evidence of mitral valve regurgitation. No evidence of mitral valve stenosis. Tricuspid Valve: The tricuspid valve is normal in structure. Tricuspid valve regurgitation is not demonstrated. No evidence of tricuspid stenosis. Aortic Valve: The aortic valve was not well visualized. Aortic valve regurgitation is moderate. Aortic regurgitation PHT measures 484 msec. Mild aortic stenosis is present. Aortic valve mean gradient measures 16.7 mmHg. Aortic valve peak gradient measures 27.3 mmHg. Aortic valve area, by VTI measures 1.17 cm. Pulmonic Valve: The pulmonic valve was not well visualized. Pulmonic valve regurgitation not assessed. No evidence of pulmonic stenosis. Aorta: The aortic root is normal in size and structure. Venous: The inferior vena cava is normal in size with greater than 50% respiratory variability, suggesting right atrial pressure of 3 mmHg. IAS/Shunts: No atrial level shunt detected by color flow Doppler.  LEFT VENTRICLE PLAX 2D LVIDd:         4.81 cm  Diastology LVIDs:         2.95 cm  LV e' lateral:   11.70 cm/s LV PW:         1.11 cm  LV E/e' lateral: 6.4 LV IVS:        0.81 cm  LV e' medial:    6.20 cm/s LVOT diam:     2.00 cm  LV E/e' medial:  12.2 LV SV:          72 LV SV Index:   41 LVOT Area:  3.14 cm  RIGHT VENTRICLE RV Basal diam:  2.75 cm RV S prime:     14.50 cm/s TAPSE (M-mode): 2.8 cm LEFT ATRIUM             Index       RIGHT ATRIUM           Index LA diam:        2.90 cm 1.65 cm/m  RA Area:     11.70 cm LA Vol (A2C):   38.8 ml 22.03 ml/m RA Volume:   29.00 ml  16.46 ml/m LA Vol (A4C):   26.7 ml 15.16 ml/m LA Biplane Vol: 32.7 ml 18.56 ml/m  AORTIC VALVE                    PULMONIC VALVE AV Area (Vmax):    1.23 cm     PV Vmax:        0.83 m/s AV Area (Vmean):   1.21 cm     PV Peak grad:   2.7 mmHg AV Area (VTI):     1.17 cm     RVOT Peak grad: 12 mmHg AV Vmax:           261.33 cm/s AV Vmean:          190.667 cm/s AV VTI:            0.614 m AV Peak Grad:      27.3 mmHg AV Mean Grad:      16.7 mmHg LVOT Vmax:         102.00 cm/s LVOT Vmean:        73.400 cm/s LVOT VTI:          0.229 m LVOT/AV VTI ratio: 0.37 AI PHT:            484 msec MITRAL VALVE               TRICUSPID VALVE MV Area (PHT): 4.08 cm    TR Peak grad:   9.2 mmHg MV Decel Time: 186 msec    TR Vmax:        152.00 cm/s MV E velocity: 75.40 cm/s MV A velocity: 85.70 cm/s  SHUNTS MV E/A ratio:  0.88        Systemic VTI:  0.23 m                            Systemic Diam: 2.00 cm Kate Sable MD Electronically signed by Kate Sable MD Signature Date/Time: 08/17/2019/1:21:12 PM    Final        Subjective: Patient seen with daughter at bedside today.  Patient states headache better after Norco this AM.  Says she has a little more feeling in the left hand today, but otherwise no changes in her stroke symptoms.  Denies new or worsening symptoms.  Agrees to close follow up with PCP for labs later this week.   Discharge Exam: Vitals:   08/18/19 0900 08/18/19 1000  BP: (!) 141/54   Pulse: 64 (!) 56  Resp: 12 13  Temp:    SpO2: 99% 100%   Vitals:   08/18/19 0738 08/18/19 0859 08/18/19 0900 08/18/19 1000  BP: (!) 122/48 (!) 141/54 (!) 141/54   Pulse: 66  64 (!) 56  Resp: 16   12 13   Temp: 98.1 F (36.7 C)     TempSrc:      SpO2: 100%  99% 100%  Weight:  Height:        General: Pt is alert, awake, not in acute distress Cardiovascular: RRR, S1/S2 +, no rubs, no gallops Respiratory: decreased breath sounds but clear, no wheezing, no rhonchi Abdominal: Soft, NT, ND, bowel sounds + Extremities: no edema, no cyanosis    The results of significant diagnostics from this hospitalization (including imaging, microbiology, ancillary and laboratory) are listed below for reference.     Microbiology: Recent Results (from the past 240 hour(s))  SARS Coronavirus 2 by RT PCR (hospital order, performed in [facility] hospital lab) Nasopharyngeal Nasopharyngeal Swab     Status: None   Collection Time: 08/17/19  2:18 AM   Specimen: Nasopharyngeal Swab  Result Value Ref Range Status   SARS Coronavirus 2 NEGATIVE NEGATIVE Final    Comment: (NOTE) SARS-CoV-2 target nucleic acids are NOT DETECTED. The SARS-CoV-2 RNA is generally detectable in upper and lower respiratory specimens during the acute phase of infection. The lowest concentration of SARS-CoV-2 viral copies this assay can detect is 250 copies / mL. A negative result does not preclude SARS-CoV-2 infection and should not be used as the sole basis for treatment or other patient management decisions.  A negative result may occur with improper specimen collection / handling, submission of specimen other than nasopharyngeal swab, presence of viral mutation(s) within the areas targeted by this assay, and inadequate number of viral copies (<250 copies / mL). A negative result must be combined with clinical observations, patient history, and epidemiological information. Fact Sheet for Patients:   StrictlyIdeas.no Fact Sheet for Healthcare Providers: BankingDealers.co.za This test is not yet approved or cleared  by the [place] FDA and has been authorized for  detection and/or diagnosis of SARS-CoV-2 by FDA under an Emergency Use Authorization (EUA).  This EUA will remain in effect (meaning this test can be used) for the duration of the COVID-19 declaration under Section 564(b)(1) of the Act, 21 U.S.C. section 360bbb-3(b)(1), unless the authorization is terminated or revoked sooner. Performed at [facility], [address] [place], [place] [address]      Labs: BNP (last 3 results) No results for input(s): BNP in the last 8760 hours. Basic Metabolic Panel: Recent Labs  Lab 08/16/19 2233 08/18/19 0454 08/18/19 1216  NA 132* 129* 128*  K 3.7 4.1  --   CL 97* 98  --   CO2 27 25  --   GLUCOSE 114* 129*  --   BUN 13 10  --   CREATININE 0.72 0.49  --   CALCIUM 8.9 8.5*  --    Liver Function Tests: No results for input(s): AST, ALT, ALKPHOS, BILITOT, PROT, ALBUMIN in the last 168 hours. No results for input(s): LIPASE, AMYLASE in the last 168 hours. No results for input(s): AMMONIA in the last 168 hours. CBC: Recent Labs  Lab 08/16/19 2233  WBC 7.7  NEUTROABS 3.2  HGB 13.0  HCT 36.0  MCV 80.9  PLT 94*   Cardiac Enzymes: No results for input(s): CKTOTAL, CKMB, CKMBINDEX, TROPONINI in the last 168 hours. BNP: Invalid input(s): POCBNP CBG: No results for input(s): GLUCAP in the last 168 hours. D-Dimer No results for input(s): DDIMER in the last 72 hours. Hgb A1c Recent Labs    08/17/19 0526  HGBA1C 5.6   Lipid Profile Recent Labs    08/17/19 0526  CHOL 129  HDL 62  LDLCALC 56  TRIG 56  CHOLHDL 2.1   Thyroid function studies Recent Labs    08/18/19 0845  TSH  6.889*   Anemia work up No results for input(s): VITAMINB12, FOLATE, FERRITIN, TIBC, IRON, RETICCTPCT in the last 72 hours. Urinalysis    Component Value Date/Time   COLORURINE YELLOW (A) 08/17/2019 0135   APPEARANCEUR CLEAR (A) 08/17/2019 0135   LABSPEC 1.006 08/17/2019 0135   PHURINE 7.0 08/17/2019 0135   GLUCOSEU NEGATIVE 08/17/2019  0135   HGBUR NEGATIVE 08/17/2019 0135   BILIRUBINUR NEGATIVE 08/17/2019 0135   KETONESUR NEGATIVE 08/17/2019 0135   PROTEINUR NEGATIVE 08/17/2019 0135   NITRITE NEGATIVE 08/17/2019 0135   LEUKOCYTESUR NEGATIVE 08/17/2019 0135   Sepsis Labs Invalid input(s): PROCALCITONIN,  WBC,  LACTICIDVEN Microbiology Recent Results (from the past 240 hour(s))  SARS Coronavirus 2 by RT PCR (hospital order, performed in [place] hospital lab) Nasopharyngeal Nasopharyngeal Swab     Status: None   Collection Time: 08/17/19  2:18 AM   Specimen: Nasopharyngeal Swab  Result Value Ref Range Status   SARS Coronavirus 2 NEGATIVE NEGATIVE Final    Comment: (NOTE) SARS-CoV-2 target nucleic acids are NOT DETECTED. The SARS-CoV-2 RNA is generally detectable in upper and lower respiratory specimens during the acute phase of infection. The lowest concentration of SARS-CoV-2 viral copies this assay can detect is 250 copies / mL. A negative result does not preclude SARS-CoV-2 infection and should not be used as the sole basis for treatment or other patient management decisions.  A negative result may occur with improper specimen collection / handling, submission of specimen other than nasopharyngeal swab, presence of viral mutation(s) within the areas targeted by this assay, and inadequate number of viral copies (<250 copies / mL). A negative result must be combined with clinical observations, patient history, and epidemiological information. Fact Sheet for Patients:   StrictlyIdeas.no Fact Sheet for Healthcare Providers: BankingDealers.co.za This test is not yet approved or cleared  by the [place] FDA and has been authorized for detection and/or diagnosis of SARS-CoV-2 by FDA under an Emergency Use Authorization (EUA).  This EUA will remain in effect (meaning this test can be used) for the duration of the COVID-19 declaration under Section 564(b)(1) of  the Act, 21 U.S.C. section 360bbb-3(b)(1), unless the authorization is terminated or revoked sooner. Performed at [facility], [address] [place], [place] [address]      Time coordinating discharge: Over 30 minutes  SIGNED:   Ezekiel Slocumb, DO [facility] 08/18/2019, 3:28 PM   If 7PM-7AM, please contact night-coverage [email or web address]

## 2019-08-18 NOTE — Progress Notes (Signed)
SLP Cancellation Note  Patient Details Name: Stephanie Ross MRN: [id number] DOB: 01/19/1966   Cancelled treatment:       Reason Eval/Treat Not Completed: SLP screened, no needs identified, will sign off(chart reviewed; consulted NSG then talked w/ pt in room). Pt denied any difficulty swallowing and is currently on a regular diet; tolerates swallowing pills w/ water per NSG; sips of water this AM. Pt conversed at conversational level w/out deficits noted; pt and NSG denied any speech-language deficits. Pt spoke briefly w/ SLP as she was in discomfort from a "12/10" headache.  No further skilled ST services indicated as pt appears at her baseline per her report. Pt agreed. NSG to reconsult if any new needs while admitted.     Orinda Kenner, MS, CCC-SLP Iwal[facility] Templeton 08/18/2019, 9:05 AM

## 2019-09-11 ENCOUNTER — Other Ambulatory Visit: Payer: Self-pay

## 2019-09-11 ENCOUNTER — Ambulatory Visit: Payer: Medicaid Other | Attending: Internal Medicine | Admitting: Physical Therapy

## 2019-09-11 ENCOUNTER — Encounter: Payer: Self-pay | Admitting: Physical Therapy

## 2019-09-11 DIAGNOSIS — M6281 Muscle weakness (generalized): Secondary | ICD-10-CM | POA: Insufficient documentation

## 2019-09-11 DIAGNOSIS — R2689 Other abnormalities of gait and mobility: Secondary | ICD-10-CM | POA: Diagnosis not present

## 2019-09-11 NOTE — Therapy (Signed)
[facility] [address] [place], [place], [address] Phone: [phone]   Fax:  [phone]  Physical Therapy Evaluation  Patient Details  Name: Stephanie Ross MRN: [id number] Date of Birth: [birth year] Referring Provider (PT): Abran Richard, MD   Encounter Date: 09/11/2019   PT End of Session - 09/11/19 1157    Visit Number 1    Number of Visits 6    Date for PT Re-Evaluation 10/23/19    Authorization - Visit Number 1    Authorization - Number of Visits 10    PT Start Time 9379    PT Stop Time 0240    PT Time Calculation (min) 50 min    Equipment Utilized During Treatment Gait belt;Oxygen   2 L O2   Activity Tolerance Patient tolerated treatment well    Behavior During Therapy WFL for tasks assessed/performed           Past Medical History:  Diagnosis Date   Asthma    Cancer ([place])    CHF (congestive heart failure) (HCC)    COPD (chronic obstructive pulmonary disease) ([place])    Hypertension     Past Surgical History:  Procedure Laterality Date   BILATERAL CARPAL TUNNEL RELEASE     CERVICAL ABLATION     ROTATOR CUFF REPAIR Right     Vitals:   09/11/19 1135  SpO2: 93%      Subjective Assessment - 09/11/19 1136    Subjective Pt. s/p CVA with resulting L sided weakness and numbness in L LE. Pt. felt onset of severe HA on 5/21, was not taken to hospital until 24 hours later. Pt. described expereincing severe HA and double vision every day for a few weeks after. HA have been decreasing in frequency to only occuring every other day now. Pt. is stage 3 COPD, presented to therapy wtih oxygen concentrator on 2 L. Pt. was a smoker for 43 years, but has since quit and has been cigarette free for 2 months now. Pt. lives with her daughter and daughter's husband in a mobile home with 6 STE and rails on both side; pt. states that entering the home has not been a challenge. Pt. states that balance is slightly an issue for  her, that she has to walk with her cane or using furniture/walls around the house, and that she is in the process of getting a shower chair that fits the tub. Pt. denies any falls since the stroke, but has had multiple near falls.    Pertinent History CVA, COPD    Patient Stated Goals wants to return to independent ADLs and walking program    Currently in Pain? No/denies              [facility] PT Assessment - 09/11/19 0001      Assessment   Medical Diagnosis CVA with left sided weakness and numbness    Referring Provider (PT) Abran Richard, MD    Onset Date/Surgical Date 08/15/19    Hand Dominance Left    Prior Therapy yes      Balance Screen   Has the patient fallen in the past 6 months No    Has the patient had a decrease in activity level because of a fear of falling?  Yes      [place] residence    Living Arrangements Children    Type of [place] home      Prior Function  Level of Independence Independent   mostly independent, daughter supervises most activities     Cognition   Overall Cognitive Status Within Functional Limits for tasks assessed           Strength: Pt. globally WFL, with exceptions in L biceps and triceps (4/5), and L knee flexion and extension (4/5) Gait speed: 10MWT, fast gait speed assessed with cane: 0.87 m/s, and without cane 0.82 m/s. Pt. demonstrated R lateral trunk lean with L swing phase, and decreased arm swing.  Balance: Merrilee Jansky was assessed 53/56 -- demonstrated most difficulty with last two items. SLS was most unstable, worse on L than R. Pt. states she has most difficulty balancing in the shower and in the yard.     Objective measurements completed on examination: See above findings.      PT Long Term Goals - 09/11/19 1307      PT LONG TERM GOAL #1   Title Pt. will increase fast gait speed from 0.87 to 1.2 m/s with cane to be able to cross streets safely.    Baseline fast gait speed with cane:  0.87 m/s    Time 6    Period Weeks    Status New    Target Date 10/23/19      PT LONG TERM GOAL #2   Title Pt. will be able to stand on left leg for 10 seconds with CGA only to decrease falls risk.    Baseline 6 seconds on left leg    Time 6    Period Weeks    Status New    Target Date 10/23/19      PT LONG TERM GOAL #3   Title Pt. will improve L biceps, triceps, knee flexion, and knee extension by a full muslce grade    Baseline 4/5 in L biceps, triceps, knee flexion, and knee extension    Time 6    Period Weeks    Status New    Target Date 10/23/19                  Plan - 09/11/19 1200    Clinical Impression Statement Pt. is a 54 y.o. female s/p CVA with left sided weakness and LE numbness.  Pt. demonstrates global strength WFL, with slight weakenss (4/5) in L knee flexion and extension, and L biceps and triceps. Pt. demonstrates fair static balance (53/56 on [place]), experienced the most difficulty with single leg balance and tandem stance. Pt.'s fast gait speed with a SPC puts her in the community ambulator category, however pt. demonstrated gait abnormalities such as a right lateral lean with left swing phase and decreased arm swing. Pt. would benefit from skilled PT to address dynamic balance deficits, gait abnormalities, and strength deficits to decrease falls risk and increase independence with ADLs.    Personal Factors and Comorbidities Transportation    Stability/Clinical Decision Making Evolving/Moderate complexity    Clinical Decision Making Moderate    Rehab Potential Good    PT Frequency 1x / week    PT Duration 6 weeks    PT Treatment/Interventions ADLs/Self Care Home Management;Aquatic Therapy;Cryotherapy;Lobbyist;Therapeutic exercise;Therapeutic activities;Functional mobility training;Stair training;Gait training;Neuromuscular re-education;Patient/family education;Passive range of motion;Vestibular;Manual techniques    PT Next  Visit Plan FGA/DGI, assess breathing techniques, unstable surfaces balance training    Consulted and Agree with Plan of Care Patient           Patient will benefit from skilled therapeutic intervention in order to improve the following deficits and  impairments:  Abnormal gait, Decreased balance, Postural dysfunction, Impaired sensation, Decreased endurance, Decreased activity tolerance, Decreased strength, Difficulty walking  Visit Diagnosis: Imbalance  Muscle weakness (generalized)  Other abnormalities of gait and mobility     Problem List Patient Active Problem List   Diagnosis Date Noted   CVA (cerebral vascular accident) ([place]) 08/17/2019   Pura Spice, PT, DPT # [id number] Carlyle Basques, SPT 09/11/2019, 5:45 PM  [facility] [address] [place], [place], [address] Phone: [phone]   Fax:  [phone]  Name: Stephanie Ross MRN: [id number] Date of Birth: [name] 10, 1965

## 2019-09-16 ENCOUNTER — Ambulatory Visit: Payer: Medicaid Other | Admitting: Physical Therapy

## 2019-09-16 ENCOUNTER — Encounter: Payer: Self-pay | Admitting: Physical Therapy

## 2019-09-16 ENCOUNTER — Other Ambulatory Visit: Payer: Self-pay

## 2019-09-16 DIAGNOSIS — R2689 Other abnormalities of gait and mobility: Secondary | ICD-10-CM

## 2019-09-16 DIAGNOSIS — M6281 Muscle weakness (generalized): Secondary | ICD-10-CM

## 2019-09-16 NOTE — Therapy (Signed)
[facility] [address] [place], [place], [address] Phone: [phone]   Fax:  [phone]  Physical Therapy Treatment  Patient Details  Name: Stephanie Ross MRN: [id number] Date of Birth: April 04, 1965 Referring Provider (PT): Abran Richard, MD   Encounter Date: 09/16/2019   PT End of Session - 09/16/19 1629    Visit Number 2    Number of Visits 6    Date for PT Re-Evaluation 10/23/19    Authorization - Visit Number 2    Authorization - Number of Visits 10    PT Start Time 2947    PT Stop Time 1629    PT Time Calculation (min) 60 min    Equipment Utilized During Treatment Gait belt;Oxygen   2 L O2   Activity Tolerance Patient tolerated treatment well    Behavior During Therapy WFL for tasks assessed/performed           Past Medical History:  Diagnosis Date  . Asthma   . Cancer ([place])   . CHF (congestive heart failure) ([place])   . COPD (chronic obstructive pulmonary disease) ([place])   . Hypertension     Past Surgical History:  Procedure Laterality Date  . BILATERAL CARPAL TUNNEL RELEASE    . CERVICAL ABLATION    . ROTATOR CUFF REPAIR Right     There were no vitals filed for this visit.   Subjective Assessment - 09/16/19 1627    Subjective Pt. reports that her sensation is returning in her legs, and that her HAs are decreasing in frequency to about every 3-4 days instead of every 1-2 days. Pt. currently had a HA upon arrival.    Pertinent History CVA, COPD    Patient Stated Goals wants to return to independent ADLs and walking program    Currently in Pain? No/denies             Neuro re-ed:  DGI: Pt. completed without AD, scored 17/24  Obstacle mat: instructed to walk over mat as normally as possible, then instructed to stop at random times. Pt. was able to recover all LOB with either ankle or hip strategies.   Standing balance in // bars on airex and bosu ball: 2x50mins each  Standing on airex with ball  toss to trampoline: 2x12 reps. Progressed to standing in tandem on airex.    Pt. required multiple breaks due to SPO2 dipping to 86%, was instructed in pursed lip breathing. O2sat would return to normal levels after breaks were completed.       PT Long Term Goals - 09/11/19 1307      PT LONG TERM GOAL #1   Title Pt. will increase fast gait speed from 0.87 to 1.2 m/s with cane to be able to cross streets safely.    Baseline fast gait speed with cane: 0.87 m/s    Time 6    Period Weeks    Status New    Target Date 10/23/19      PT LONG TERM GOAL #2   Title Pt. will be able to stand on left leg for 10 seconds with CGA only to decrease falls risk.    Baseline 6 seconds on left leg    Time 6    Period Weeks    Status New    Target Date 10/23/19      PT LONG TERM GOAL #3   Title Pt. will improve L biceps, triceps, knee flexion, and knee extension by a full muslce  grade    Baseline 4/5 in L biceps, triceps, knee flexion, and knee extension    Time 6    Period Weeks    Status New    Target Date 10/23/19                 Plan - 09/16/19 1633    Clinical Impression Statement Pt. performed DGI (17/24), as well as completed other high level dynamic balance exercises such as walking on unstable surfaces and balancing on unstable surfaces with perturbations. Pt. performed all well, was able to self recover all LOB today. Pt. was desatting to 86-87% today during exercises, and was able to get back to upper 90s with pursed lip breathing. Pt. will continue to benefit from skilled PT to improve balance, improve functional mobility, and decrease falls risk.    Personal Factors and Comorbidities Transportation    Stability/Clinical Decision Making Evolving/Moderate complexity    Clinical Decision Making Moderate    Rehab Potential Good    PT Frequency 1x / week    PT Duration 6 weeks    PT Treatment/Interventions ADLs/Self Care Home Management;Aquatic Therapy;Cryotherapy;Education officer, community;Therapeutic exercise;Therapeutic activities;Functional mobility training;Stair training;Gait training;Neuromuscular re-education;Patient/family education;Passive range of motion;Vestibular;Manual techniques    PT Next Visit Plan FGA/DGI, assess breathing techniques, unstable surfaces balance training    Consulted and Agree with Plan of Care Patient           Patient will benefit from skilled therapeutic intervention in order to improve the following deficits and impairments:  Abnormal gait, Decreased balance, Postural dysfunction, Impaired sensation, Decreased endurance, Decreased activity tolerance, Decreased strength, Difficulty walking  Visit Diagnosis: Imbalance  Muscle weakness (generalized)  Other abnormalities of gait and mobility     Problem List Patient Active Problem List   Diagnosis Date Noted  . CVA (cerebral vascular accident) ([place]) 08/17/2019   Pura Spice, PT, DPT # [id number], SPT 09/16/2019, 4:37 PM  [facility] [address] [place], [place], [address] Phone: [phone]   Fax:  [phone]  Name: Stephanie Ross MRN: [id number] Date of Birth: 1965-12-08

## 2019-09-25 ENCOUNTER — Encounter: Payer: Self-pay | Admitting: Physical Therapy

## 2019-09-25 ENCOUNTER — Other Ambulatory Visit: Payer: Self-pay

## 2019-09-25 ENCOUNTER — Ambulatory Visit: Payer: Medicaid Other | Attending: Internal Medicine | Admitting: Physical Therapy

## 2019-09-25 DIAGNOSIS — R2689 Other abnormalities of gait and mobility: Secondary | ICD-10-CM | POA: Insufficient documentation

## 2019-09-25 DIAGNOSIS — M6281 Muscle weakness (generalized): Secondary | ICD-10-CM | POA: Diagnosis present

## 2019-09-25 NOTE — Therapy (Signed)
[facility] [address] [place], [place], [address] Phone: [phone]   Fax:  [phone]  Physical Therapy Treatment  Patient Details  Name: Stephanie Ross MRN: [id number] Date of Birth: [birth year] Referring Provider (PT): Abran Richard, MD   Encounter Date: 09/25/2019   PT End of Session - 09/25/19 1310    Visit Number 3    Number of Visits 6    Date for PT Re-Evaluation 10/23/19    Authorization - Visit Number 3    Authorization - Number of Visits 10    PT Start Time 3710    PT Stop Time 1200    PT Time Calculation (min) 35 min    Equipment Utilized During Treatment Gait belt;Oxygen   2 L O2   Activity Tolerance Patient tolerated treatment well;Other (comment)   pt. limited by O2 sat   Behavior During Therapy WFL for tasks assessed/performed           Past Medical History:  Diagnosis Date   Asthma    Cancer ([place])    CHF (congestive heart failure) (HCC)    COPD (chronic obstructive pulmonary disease) ([place])    Hypertension     Past Surgical History:  Procedure Laterality Date   BILATERAL CARPAL TUNNEL RELEASE     CERVICAL ABLATION     ROTATOR CUFF REPAIR Right     There were no vitals filed for this visit.   Subjective Assessment - 09/25/19 1309    Subjective Pt. reports that she went to the pulmonologist yesterday and that they are increasing her oxygen to 3L during activity. Pt. reports that she has been feeling more balanced and confident, and that she is trying ot walk more without her cane.    Pertinent History CVA, COPD    Patient Stated Goals wants to return to independent ADLs and walking program    Currently in Pain? No/denies           Neuro re-ed:  Standing marches on airex: 10x   Standing single leg balance on airex: 5x each leg for 10 sec each. Progressed to eyes closed. Pt. experienced greater LOB but able to recover with use of treadmill arm and therapist. Pt. able to stand for  ~5 seconds on each leg.  Standing on bosu ball side up with perturbations: Pt. able to correct all LOB from perturbations. Pt. practiced shower activities while on bosu.   Walking with pivot turns: pt able to maintain balance and turning within 3 seconds  Walking with horizontal head turns and dual tasking  Obstacle course: sidestepping over small hurdles, walking over unstable surface  STS with airex under feet: pt able to complete with no use of hands       PT Long Term Goals - 09/11/19 1307      PT LONG TERM GOAL #1   Title Pt. will increase fast gait speed from 0.87 to 1.2 m/s with cane to be able to cross streets safely.    Baseline fast gait speed with cane: 0.87 m/s    Time 6    Period Weeks    Status New    Target Date 10/23/19      PT LONG TERM GOAL #2   Title Pt. will be able to stand on left leg for 10 seconds with CGA only to decrease falls risk.    Baseline 6 seconds on left leg    Time 6    Period Weeks  Status New    Target Date 10/23/19      PT LONG TERM GOAL #3   Title Pt. will improve L biceps, triceps, knee flexion, and knee extension by a full muslce grade    Baseline 4/5 in L biceps, triceps, knee flexion, and knee extension    Time 6    Period Weeks    Status New    Target Date 10/23/19              Plan - 09/25/19 1312    Clinical Impression Statement Pt. performed upper level static and dynamic balance activities today on 3 L of oxygen. Pt. desatted twice down to 85% after completing activities for 3-4 minutes continuously, pt. was cued to complete pursed lip breathing. Pt. able to recover all LOB during activities today. Pt. will continue to benefit from skilled PT to address balance concerns and improve functional mobility.    Personal Factors and Comorbidities Transportation    Stability/Clinical Decision Making Evolving/Moderate complexity    Clinical Decision Making Moderate    Rehab Potential Good    PT Frequency 1x / week    PT  Duration 6 weeks    PT Treatment/Interventions ADLs/Self Care Home Management;Aquatic Therapy;Cryotherapy;Lobbyist;Therapeutic exercise;Therapeutic activities;Functional mobility training;Stair training;Gait training;Neuromuscular re-education;Patient/family education;Passive range of motion;Vestibular;Manual techniques    PT Next Visit Plan Assess breathing techniques, unstable surfaces balance training    Consulted and Agree with Plan of Care Patient           Patient will benefit from skilled therapeutic intervention in order to improve the following deficits and impairments:  Abnormal gait, Decreased balance, Postural dysfunction, Impaired sensation, Decreased endurance, Decreased activity tolerance, Decreased strength, Difficulty walking  Visit Diagnosis: Imbalance  Muscle weakness (generalized)  Other abnormalities of gait and mobility     Problem List Patient Active Problem List   Diagnosis Date Noted   CVA (cerebral vascular accident) ([place]) 08/17/2019   Pura Spice, PT, DPT # [id number] Carlyle Basques, SPT 09/25/2019, 4:10 PM  [facility] [address] [place], [place], [address] Phone: [phone]   Fax:  [phone]  Name: ANGELES ZEHNER MRN: [id number] Date of Birth: 11/16/1965

## 2019-09-30 ENCOUNTER — Other Ambulatory Visit: Payer: Self-pay

## 2019-09-30 ENCOUNTER — Ambulatory Visit: Payer: Medicaid Other | Admitting: Physical Therapy

## 2019-09-30 ENCOUNTER — Encounter: Payer: Self-pay | Admitting: Physical Therapy

## 2019-09-30 DIAGNOSIS — M6281 Muscle weakness (generalized): Secondary | ICD-10-CM

## 2019-09-30 DIAGNOSIS — R2689 Other abnormalities of gait and mobility: Secondary | ICD-10-CM | POA: Diagnosis not present

## 2019-09-30 NOTE — Therapy (Signed)
[facility] [address] [place], [place], [address] Phone: [phone]   Fax:  [phone]  Physical Therapy Treatment  Patient Details  Name: Stephanie Ross MRN: [id number] Date of Birth: [birth year] Referring Provider (PT): Abran Richard, MD   Encounter Date: 09/30/2019   PT End of Session - 09/30/19 0902    Visit Number 4    Number of Visits 6    Date for PT Re-Evaluation 10/23/19    Authorization - Visit Number 4    Authorization - Number of Visits 10    PT Start Time 6945    PT Stop Time 0954    PT Time Calculation (min) 59 min    Equipment Utilized During Treatment Gait belt;Oxygen   2 L O2   Activity Tolerance Patient tolerated treatment well;Other (comment)   pt. limited by O2 sat   Behavior During Therapy [facility] for tasks assessed/performed           Past Medical History:  Diagnosis Date  . Asthma   . Cancer ([place])   . CHF (congestive heart failure) ([place])   . COPD (chronic obstructive pulmonary disease) ([place])   . Hypertension     Past Surgical History:  Procedure Laterality Date  . BILATERAL CARPAL TUNNEL RELEASE    . CERVICAL ABLATION    . ROTATOR CUFF REPAIR Right     There were no vitals filed for this visit.   Subjective Assessment - 09/30/19 0859    Subjective Pt. reports that HA have been decreasing with medication. Pt. reports that she has been able to shower independently as of yesterday.    Pertinent History CVA, COPD    Patient Stated Goals wants to return to independent ADLs and walking program             There Ex:  Nustep 10 mins L3  TRX single leg descent STS, double leg stand: 10x descent on each leg  Neuro re-ed:  Outdoor walking: Pt. required supervision to complete, no LOB.  Single leg balance: Pt. able to stand on either leg for 10 seconds minimum with supervision. Pt. progressed ball tosses in // bars with supervision-CGA. Pt. able to stand easier on L leg than R. Pt.  able to correct all LOB.        PT Long Term Goals - 09/30/19 0388      PT LONG TERM GOAL #1   Title Pt. will increase fast gait speed from 0.87 to 1.2 m/s with cane to be able to cross streets safely.    Baseline Pt. able to ambulate without cane, gait speeds will be assessed next session.    Time 6    Period Weeks    Status Partially Met    Target Date 10/23/19      PT LONG TERM GOAL #2   Title Pt. will be able to stand on left leg for 10 seconds with CGA only to decrease falls risk.    Baseline 6 seconds on left leg    Time 6    Period Weeks    Status Achieved    Target Date 10/22/17      PT LONG TERM GOAL #3   Title Pt. will improve L biceps, triceps, knee flexion, and knee extension by a full muslce grade    Baseline Will be reassessed next session.    Time 6    Period Weeks    Status On-going    Target Date 10/23/19  Plan - 09/30/19 0954    Clinical Impression Statement Pt. able to perform upper level static and dynamic balance activities on 3 L of oxygen. Pt. desatted to 86%x2 today, able to recover to 90% within 1-2 minutes. Pt. completed outdoor walking today with no LOB, pt. stated she felt very confident completing this. Pt. continues to progress breathing exercises to improve O2 sat. Pt. will continue to benefit from skilled PT to improve functional mobility and decrase falls risk.    Personal Factors and Comorbidities Transportation    Stability/Clinical Decision Making Evolving/Moderate complexity    Clinical Decision Making Moderate    Rehab Potential Good    PT Frequency 1x / week    PT Duration 6 weeks    PT Treatment/Interventions ADLs/Self Care Home Management;Aquatic Therapy;Cryotherapy;Lobbyist;Therapeutic exercise;Therapeutic activities;Functional mobility training;Stair training;Gait training;Neuromuscular re-education;Patient/family education;Passive range of motion;Vestibular;Manual techniques     PT Next Visit Plan Assess breathing techniques, unstable surfaces balance training    Consulted and Agree with Plan of Care Patient           Patient will benefit from skilled therapeutic intervention in order to improve the following deficits and impairments:  Abnormal gait, Decreased balance, Postural dysfunction, Impaired sensation, Decreased endurance, Decreased activity tolerance, Decreased strength, Difficulty walking  Visit Diagnosis: Imbalance  Muscle weakness (generalized)  Other abnormalities of gait and mobility     Problem List Patient Active Problem List   Diagnosis Date Noted  . CVA (cerebral vascular accident) ([place]) 08/17/2019   Pura Spice, PT, DPT # [id number], SPT 09/30/2019, 1:18 PM  [place] [facility] [address] [place], [place], [address] Phone: [phone]   Fax:  [phone]  Name: Stephanie Ross MRN: [id number] Date of Birth: [birth year]

## 2019-10-08 ENCOUNTER — Ambulatory Visit: Payer: Medicaid Other | Admitting: Physical Therapy

## 2019-10-09 ENCOUNTER — Encounter: Payer: Self-pay | Admitting: Physical Therapy

## 2019-10-09 ENCOUNTER — Other Ambulatory Visit: Payer: Self-pay

## 2019-10-09 ENCOUNTER — Ambulatory Visit: Payer: Medicaid Other | Admitting: Physical Therapy

## 2019-10-09 DIAGNOSIS — R2689 Other abnormalities of gait and mobility: Secondary | ICD-10-CM

## 2019-10-09 DIAGNOSIS — M6281 Muscle weakness (generalized): Secondary | ICD-10-CM

## 2019-10-09 NOTE — Patient Instructions (Signed)
Access Code: [email or web address]: [email or web address]: 07/15/2021Prepared by: Burnett Corrente with Unilateral Counter Support - 1 x daily - 7 x weekly - 1 sets - 10 reps  Standing Single Leg Stance with Counter Support - 1 x daily - 7 x weekly - 1 sets - 3 reps  Single Leg Balance with Opposite Leg Star Reach - 1 x daily - 7 x weekly - 1 sets - 3 reps

## 2019-10-09 NOTE — Therapy (Signed)
[facility] [address]. [place], [place], [address] Phone: [phone]   Fax:  [phone]  Physical Therapy Treatment  Patient Details  Name: Stephanie Ross MRN: [id number] Date of Birth: 1965-08-24 Referring Provider (PT): Abran Richard, MD   Encounter Date: 10/09/2019   PT End of Session - 10/09/19 0948    Visit Number 5    Number of Visits 6    Date for PT Re-Evaluation 10/23/19    Authorization - Visit Number 5    Authorization - Number of Visits 10    PT Start Time 3361    PT Stop Time 0943    PT Time Calculation (min) 49 min    Equipment Utilized During Treatment Gait belt;Oxygen   2 L O2   Activity Tolerance Patient tolerated treatment well;Other (comment)   pt. limited by O2 sat   Behavior During Therapy [facility] for tasks assessed/performed           Past Medical History:  Diagnosis Date  . Asthma   . Cancer ([place])   . CHF (congestive heart failure) ([place])   . COPD (chronic obstructive pulmonary disease) ([place])   . Hypertension     Past Surgical History:  Procedure Laterality Date  . BILATERAL CARPAL TUNNEL RELEASE    . CERVICAL ABLATION    . ROTATOR CUFF REPAIR Right     There were no vitals filed for this visit.   Subjective Assessment - 10/09/19 0945    Subjective Pt. continues to do well with balance. Pt. states that some of the medication she is taking is causing moderate bruising on her skin.    Pertinent History CVA, COPD    Patient Stated Goals wants to return to independent ADLs and walking program    Currently in Pain? No/denies              There Ex:    Nustep: L3 10 mins  Strength testing: L triceps, biceps, knee flex, and knee ext all 5/5  Neuro re-ed:  [place] walking: half hallway leading with L foot, half hallway leading with R foot. Pt. only required CGA throughout  Standing single leg balance: Pt. able to achieve 30 s on L leg.  Standing alternating hand to knee with  march: Pt. progressed to "red light green light" with improved control of single leg stance. Pt. able to correct all LOB.   Star balance: Pt. able to complete, R leg stance easier than L.  Tandem walking in // bar: progressed to tandem marching walk with CGA, and 1x with eyes closed with min assist to recover balance.            PT Education - 10/09/19 0939    Education Details See HEP (balance focus)    Person(s) Educated Patient    Methods Explanation;Demonstration;Handout    Comprehension Verbalized understanding;Returned demonstration               PT Long Term Goals - 10/09/19 1000      PT LONG TERM GOAL #1   Title Pt. will increase fast gait speed from 0.87 to 1.2 m/s with cane to be able to cross streets safely.    Baseline Pt. able to ambulate without cane, gait speeds will be assessed next session. 7/15: Pt able to ambulate without AD fast pace: 1.53 m/s, normal pace: 1.02 m/s    Time 6    Period Weeks    Status Achieved    Target Date  10/09/19      PT LONG TERM GOAL #2   Title Pt. will be able to stand on left leg for 10 seconds with CGA only to decrease falls risk.    Baseline 6 seconds on left leg. 7/15: pt. able to stand on L leg with supervision for 30 sec.    Time 6    Period Weeks    Status Achieved    Target Date 10/09/19      PT LONG TERM GOAL #3   Title Pt. will improve L biceps, triceps, knee flexion, and knee extension by a full muslce grade    Baseline Will be reassessed next session. 10/09/19: Pt. has 5/5 strength with L biceps, triceps, knee flex, and knee ext.    Time 6    Period Weeks    Status Achieved    Target Date 10/09/19                 Plan - 10/09/19 0949    Clinical Impression Statement Pt. has met all goals today. Pt. upped to 3L of oxygen at the start of session, still dessated to 86%x1 and 88%x1 after approx. 3 mins of mod. activitiy. Pt. able to recover to above 90% within 1-2 minutes. Pt. able to correct all LOB  today, was able to complete upper level static and dynamic balance actvities. Pt. given updated HEP with balance activities to progress safely at home. Pt. would benefit from referral to outpt. cardiopulm. rehab to address consistent desaturation with mod activitiy. Pt. instructed to contact PT clinic in case of regression or any new concerns.    Personal Factors and Comorbidities Transportation    Stability/Clinical Decision Making Evolving/Moderate complexity    Clinical Decision Making Moderate    Rehab Potential Good    PT Frequency 1x / week    PT Duration 6 weeks    PT Treatment/Interventions ADLs/Self Care Home Management;Aquatic Therapy;Cryotherapy;Lobbyist;Therapeutic exercise;Therapeutic activities;Functional mobility training;Stair training;Gait training;Neuromuscular re-education;Patient/family education;Passive range of motion;Vestibular;Manual techniques    PT Next Visit Plan Assess breathing techniques, unstable surfaces balance training    Consulted and Agree with Plan of Care Patient           Patient will benefit from skilled therapeutic intervention in order to improve the following deficits and impairments:  Abnormal gait, Decreased balance, Postural dysfunction, Impaired sensation, Decreased endurance, Decreased activity tolerance, Decreased strength, Difficulty walking  Visit Diagnosis: Imbalance  Muscle weakness (generalized)  Other abnormalities of gait and mobility     Problem List Patient Active Problem List   Diagnosis Date Noted  . CVA (cerebral vascular accident) ([place]) 08/17/2019   Pura Spice, PT, DPT # [id number], SPT 10/10/2019, 9:38 AM  [facility] [address] [place], [place], [address] Phone: [phone]   Fax:  [phone]  Name: Stephanie Ross MRN: [id number] Date of Birth: [birth year]

## 2019-10-14 ENCOUNTER — Other Ambulatory Visit: Payer: Self-pay

## 2019-10-14 ENCOUNTER — Emergency Department: Payer: Medicaid Other

## 2019-10-14 ENCOUNTER — Inpatient Hospital Stay
Admission: EM | Admit: 2019-10-14 | Discharge: 2019-10-18 | DRG: 395 | Disposition: A | Discharge status: Home / Self Care | Payer: Medicaid Other | Attending: General Surgery | Admitting: General Surgery

## 2019-10-14 DIAGNOSIS — Z791 Long term (current) use of non-steroidal anti-inflammatories (NSAID): Secondary | ICD-10-CM | POA: Diagnosis not present

## 2019-10-14 DIAGNOSIS — Z7951 Long term (current) use of inhaled steroids: Secondary | ICD-10-CM

## 2019-10-14 DIAGNOSIS — Z7982 Long term (current) use of aspirin: Secondary | ICD-10-CM

## 2019-10-14 DIAGNOSIS — I509 Heart failure, unspecified: Secondary | ICD-10-CM | POA: Diagnosis present

## 2019-10-14 DIAGNOSIS — J432 Centrilobular emphysema: Secondary | ICD-10-CM | POA: Diagnosis present

## 2019-10-14 DIAGNOSIS — Z7902 Long term (current) use of antithrombotics/antiplatelets: Secondary | ICD-10-CM

## 2019-10-14 DIAGNOSIS — Z20822 Contact with and (suspected) exposure to covid-19: Secondary | ICD-10-CM | POA: Diagnosis present

## 2019-10-14 DIAGNOSIS — I351 Nonrheumatic aortic (valve) insufficiency: Secondary | ICD-10-CM | POA: Diagnosis present

## 2019-10-14 DIAGNOSIS — K3580 Unspecified acute appendicitis: Secondary | ICD-10-CM | POA: Diagnosis present

## 2019-10-14 DIAGNOSIS — I2510 Atherosclerotic heart disease of native coronary artery without angina pectoris: Secondary | ICD-10-CM | POA: Diagnosis present

## 2019-10-14 DIAGNOSIS — Z87891 Personal history of nicotine dependence: Secondary | ICD-10-CM

## 2019-10-14 DIAGNOSIS — Z8673 Personal history of transient ischemic attack (TIA), and cerebral infarction without residual deficits: Secondary | ICD-10-CM

## 2019-10-14 DIAGNOSIS — Z888 Allergy status to other drugs, medicaments and biological substances status: Secondary | ICD-10-CM | POA: Diagnosis not present

## 2019-10-14 DIAGNOSIS — Z79899 Other long term (current) drug therapy: Secondary | ICD-10-CM

## 2019-10-14 DIAGNOSIS — Z880 Allergy status to penicillin: Secondary | ICD-10-CM | POA: Diagnosis not present

## 2019-10-14 DIAGNOSIS — I10 Essential (primary) hypertension: Secondary | ICD-10-CM | POA: Diagnosis not present

## 2019-10-14 DIAGNOSIS — A419 Sepsis, unspecified organism: Secondary | ICD-10-CM

## 2019-10-14 DIAGNOSIS — K37 Unspecified appendicitis: Secondary | ICD-10-CM

## 2019-10-14 DIAGNOSIS — R1031 Right lower quadrant pain: Secondary | ICD-10-CM | POA: Diagnosis not present

## 2019-10-14 DIAGNOSIS — Z9981 Dependence on supplemental oxygen: Secondary | ICD-10-CM

## 2019-10-14 DIAGNOSIS — I110 Hypertensive heart disease with heart failure: Secondary | ICD-10-CM | POA: Diagnosis present

## 2019-10-14 DIAGNOSIS — Z01818 Encounter for other preprocedural examination: Secondary | ICD-10-CM | POA: Diagnosis not present

## 2019-10-14 LAB — CBC
HCT: 37.9 % (ref 36.0–46.0)
Hemoglobin: 13.2 g/dL (ref 12.0–15.0)
MCH: 29.9 pg (ref 26.0–34.0)
MCHC: 34.8 g/dL (ref 30.0–36.0)
MCV: 85.7 fL (ref 80.0–100.0)
Platelets: 163 10*3/uL (ref 150–400)
RBC: 4.42 MIL/uL (ref 3.87–5.11)
RDW: 14.7 % (ref 11.5–15.5)
WBC: 16.7 10*3/uL — ABNORMAL HIGH (ref 4.0–10.5)
nRBC: 0 % (ref 0.0–0.2)

## 2019-10-14 LAB — URINALYSIS, COMPLETE (UACMP) WITH MICROSCOPIC
Bilirubin Urine: NEGATIVE
Glucose, UA: NEGATIVE mg/dL
Hgb urine dipstick: NEGATIVE
Ketones, ur: NEGATIVE mg/dL
Leukocytes,Ua: NEGATIVE
Nitrite: NEGATIVE
Protein, ur: NEGATIVE mg/dL
Specific Gravity, Urine: 1.018 (ref 1.005–1.030)
pH: 6 (ref 5.0–8.0)

## 2019-10-14 LAB — COMPREHENSIVE METABOLIC PANEL
ALT: 22 U/L (ref 0–44)
AST: 24 U/L (ref 15–41)
Albumin: 3.8 g/dL (ref 3.5–5.0)
Alkaline Phosphatase: 97 U/L (ref 38–126)
Anion gap: 11 (ref 5–15)
BUN: 17 mg/dL (ref 6–20)
CO2: 27 mmol/L (ref 22–32)
Calcium: 9.2 mg/dL (ref 8.9–10.3)
Chloride: 93 mmol/L — ABNORMAL LOW (ref 98–111)
Creatinine, Ser: 0.76 mg/dL (ref 0.44–1.00)
GFR calc Af Amer: >60 mL/min (ref >60)
GFR calc non Af Amer: >60 mL/min (ref >60)
Glucose, Bld: 127 mg/dL — ABNORMAL HIGH (ref 70–99)
Potassium: 4.2 mmol/L (ref 3.5–5.1)
Sodium: 131 mmol/L — ABNORMAL LOW (ref 135–145)
Total Bilirubin: 1.2 mg/dL (ref 0.3–1.2)
Total Protein: 9 g/dL — ABNORMAL HIGH (ref 6.5–8.1)

## 2019-10-14 LAB — LACTIC ACID, PLASMA
Lactic Acid, Venous: 0.9 mmol/L (ref 0.5–1.9)
Lactic Acid, Venous: 0.8 mmol/L (ref 0.5–1.9)

## 2019-10-14 LAB — SARS CORONAVIRUS 2 BY RT PCR (HOSPITAL ORDER, PERFORMED IN ~~LOC~~ HOSPITAL LAB): SARS Coronavirus 2: NEGATIVE

## 2019-10-14 LAB — TROPONIN I (HIGH SENSITIVITY)
Troponin I (High Sensitivity): 9 ng/L (ref <18)
Troponin I (High Sensitivity): 12 ng/L (ref <18)

## 2019-10-14 LAB — LIPASE, BLOOD: Lipase: 25 U/L (ref 11–51)

## 2019-10-14 MED ORDER — MONTELUKAST SODIUM 10 MG PO TABS
10.0000 mg | ORAL_TABLET | Freq: Every day | ORAL | Status: DC
  Administered 2019-10-15 – 2019-10-17 (×4): 10 mg via ORAL
  Filled 2019-10-14 (×5): qty 1

## 2019-10-14 MED ORDER — ATORVASTATIN CALCIUM 20 MG PO TABS
20.0000 mg | ORAL_TABLET | Freq: Every day | ORAL | Status: DC
  Administered 2019-10-15 – 2019-10-18 (×4): 20 mg via ORAL
  Filled 2019-10-14 (×4): qty 1

## 2019-10-14 MED ORDER — NORTRIPTYLINE HCL 10 MG PO CAPS
20.0000 mg | ORAL_CAPSULE | Freq: Every day | ORAL | Status: DC
  Administered 2019-10-15 – 2019-10-17 (×4): 20 mg via ORAL
  Filled 2019-10-14 (×5): qty 2

## 2019-10-14 MED ORDER — ENOXAPARIN SODIUM 40 MG/0.4ML ~~LOC~~ SOLN
40.0000 mg | SUBCUTANEOUS | Status: DC
  Administered 2019-10-15 – 2019-10-18 (×4): 40 mg via SUBCUTANEOUS
  Filled 2019-10-14 (×4): qty 0.4

## 2019-10-14 MED ORDER — COQ10 100 MG PO CAPS: 100.0000 mg | ORAL_CAPSULE | Freq: Every day | ORAL | Status: DC

## 2019-10-14 MED ORDER — SODIUM CHLORIDE 0.9 % IV SOLN: INTRAVENOUS | Status: DC

## 2019-10-14 MED ORDER — IOHEXOL 350 MG/ML SOLN
125.0000 mL | Freq: Once | INTRAVENOUS | Status: DC | PRN
  Filled 2019-10-14: qty 125

## 2019-10-14 MED ORDER — MORPHINE SULFATE (PF) 4 MG/ML IV SOLN
4.0000 mg | INTRAVENOUS | Status: DC | PRN
  Filled 2019-10-14: qty 1

## 2019-10-14 MED ORDER — SODIUM CHLORIDE 0.9% FLUSH: 3.0000 mL | Freq: Once | INTRAVENOUS | Status: DC

## 2019-10-14 MED ORDER — ONDANSETRON HCL 4 MG/2ML IJ SOLN: 4.0000 mg | Freq: Four times a day (QID) | INTRAMUSCULAR | Status: DC | PRN

## 2019-10-14 MED ORDER — ACETAMINOPHEN 325 MG PO TABS: 650.0000 mg | ORAL_TABLET | Freq: Four times a day (QID) | ORAL | Status: DC | PRN

## 2019-10-14 MED ORDER — ONDANSETRON HCL 4 MG/2ML IJ SOLN
4.0000 mg | Freq: Once | INTRAMUSCULAR | Status: AC
  Administered 2019-10-14: 4 mg via INTRAVENOUS
  Filled 2019-10-14: qty 2

## 2019-10-14 MED ORDER — ONDANSETRON 4 MG PO TBDP: 4.0000 mg | ORAL_TABLET | Freq: Four times a day (QID) | ORAL | Status: DC | PRN

## 2019-10-14 MED ORDER — IOHEXOL 350 MG/ML SOLN
100.0000 mL | Freq: Once | INTRAVENOUS | Status: AC | PRN
  Administered 2019-10-14: 100 mL via INTRAVENOUS
  Filled 2019-10-14: qty 100

## 2019-10-14 MED ORDER — ALBUTEROL SULFATE (2.5 MG/3ML) 0.083% IN NEBU: 3.0000 mL | INHALATION_SOLUTION | RESPIRATORY_TRACT | Status: DC | PRN

## 2019-10-14 MED ORDER — FLUTICASONE FUROATE-VILANTEROL 200-25 MCG/INH IN AEPB
1.0000 | INHALATION_SPRAY | Freq: Every day | RESPIRATORY_TRACT | Status: DC
  Administered 2019-10-15 – 2019-10-18 (×4): 1 via RESPIRATORY_TRACT
  Filled 2019-10-14: qty 28

## 2019-10-14 MED ORDER — AMLODIPINE BESYLATE 5 MG PO TABS
5.0000 mg | ORAL_TABLET | Freq: Every day | ORAL | Status: DC
  Filled 2019-10-14: qty 1

## 2019-10-14 MED ORDER — ASPIRIN EC 81 MG PO TBEC
81.0000 mg | DELAYED_RELEASE_TABLET | Freq: Every day | ORAL | Status: DC
  Administered 2019-10-15 – 2019-10-18 (×4): 81 mg via ORAL
  Filled 2019-10-14 (×4): qty 1

## 2019-10-14 MED ORDER — SODIUM CHLORIDE 0.9 % IV BOLUS
1000.0000 mL | Freq: Once | INTRAVENOUS | Status: AC
  Administered 2019-10-14: 1000 mL via INTRAVENOUS

## 2019-10-14 MED ORDER — CIPROFLOXACIN IN D5W 400 MG/200ML IV SOLN
400.0000 mg | Freq: Once | INTRAVENOUS | Status: AC
  Administered 2019-10-14: 400 mg via INTRAVENOUS
  Filled 2019-10-14: qty 200

## 2019-10-14 MED ORDER — METRONIDAZOLE IN NACL 5-0.79 MG/ML-% IV SOLN
500.0000 mg | Freq: Three times a day (TID) | INTRAVENOUS | Status: DC
  Administered 2019-10-15 – 2019-10-18 (×11): 500 mg via INTRAVENOUS
  Filled 2019-10-14 (×14): qty 100

## 2019-10-14 MED ORDER — CIPROFLOXACIN IN D5W 400 MG/200ML IV SOLN
400.0000 mg | Freq: Two times a day (BID) | INTRAVENOUS | Status: DC
  Administered 2019-10-15 – 2019-10-17 (×7): 400 mg via INTRAVENOUS
  Filled 2019-10-14 (×10): qty 200

## 2019-10-14 MED ORDER — PANTOPRAZOLE SODIUM 40 MG IV SOLR
40.0000 mg | Freq: Every day | INTRAVENOUS | Status: DC
  Administered 2019-10-15 – 2019-10-17 (×4): 40 mg via INTRAVENOUS
  Filled 2019-10-14 (×4): qty 40

## 2019-10-14 MED ORDER — HYDROMORPHONE HCL 1 MG/ML IJ SOLN
0.5000 mg | Freq: Once | INTRAMUSCULAR | Status: AC
  Administered 2019-10-14: 0.5 mg via INTRAVENOUS
  Filled 2019-10-14: qty 1

## 2019-10-14 MED ORDER — HYDROCODONE-ACETAMINOPHEN 5-325 MG PO TABS
1.0000 | ORAL_TABLET | ORAL | Status: DC | PRN
  Administered 2019-10-15: 1 via ORAL
  Administered 2019-10-15 (×2): 2 via ORAL
  Administered 2019-10-15: 1 via ORAL
  Administered 2019-10-16 – 2019-10-17 (×8): 2 via ORAL
  Filled 2019-10-14 (×4): qty 2
  Filled 2019-10-14: qty 1
  Filled 2019-10-14 (×6): qty 2
  Filled 2019-10-14: qty 1

## 2019-10-14 MED ORDER — METRONIDAZOLE IN NACL 5-0.79 MG/ML-% IV SOLN
500.0000 mg | Freq: Once | INTRAVENOUS | Status: AC
  Administered 2019-10-14: 500 mg via INTRAVENOUS
  Filled 2019-10-14: qty 100

## 2019-10-14 MED ORDER — ACETAMINOPHEN 650 MG RE SUPP: 650.0000 mg | Freq: Four times a day (QID) | RECTAL | Status: DC | PRN

## 2019-10-14 MED ORDER — TIOTROPIUM BROMIDE MONOHYDRATE 18 MCG IN CAPS
1.0000 | ORAL_CAPSULE | Freq: Every day | RESPIRATORY_TRACT | Status: DC
  Administered 2019-10-15: 18 ug via RESPIRATORY_TRACT
  Filled 2019-10-14: qty 5

## 2019-10-14 MED ORDER — LORATADINE 10 MG PO TABS
10.0000 mg | ORAL_TABLET | Freq: Every day | ORAL | Status: DC
  Administered 2019-10-15 – 2019-10-18 (×4): 10 mg via ORAL
  Filled 2019-10-14 (×4): qty 1

## 2019-10-14 NOTE — H&P (Signed)
SURGICAL HISTORY AND PHYSICAL NOTE   HISTORY OF PRESENT ILLNESS (HPI):  54 y.o. female presented to [facility] ED for evaluation of abdominal pain since 3 to 4 days ago. Patient reports started with mild abdominal pain generalized in the abdomen.  The pain has been localized more in the right lower quadrant.  She denies some associated nausea but no vomiting.  She denies any fever chills.  At the ED CT scan of the chest abdomen and pelvis was done.  Only finding was acute appendicitis, uncomplicated.  Leukocytosis of 16,000.  Is important to mention that the patient has recent history of strokes 2 months ago.  She was admitted to this hospital due to weakness and she was found with small stroke.  She has been treated with Plavix and aspirin and recommendation was to take it for 6 months.  Patient also has history of severe COPD.  Today no sign of exacerbation.  Patient also with history of CHF and coronary tree disease.  Patient denies chest pain.  Surgery is consulted by Dr. Jari Pigg in this context for evaluation and management of acute appendicitis.  PAST MEDICAL HISTORY (PMH):  Past Medical History:  Diagnosis Date  . Asthma   . Cancer ([place])   . CHF (congestive heart failure) ([place])   . COPD (chronic obstructive pulmonary disease) ([place])   . Hypertension      PAST SURGICAL HISTORY ([place]):  Past Surgical History:  Procedure Laterality Date  . BILATERAL CARPAL TUNNEL RELEASE    . CERVICAL ABLATION    . ROTATOR CUFF REPAIR Right      MEDICATIONS:  Prior to Admission medications   Medication Sig Start Date End Date Taking? Authorizing Provider  albuterol (PROVENTIL HFA;VENTOLIN HFA) 108 (90 Base) MCG/ACT inhaler Inhale 2 puffs into the lungs every 4 (four) hours as needed for wheezing or shortness of breath. 05/07/17  Yes Noemi Chapel, MD  amLODipine (NORVASC) 5 MG tablet Take 5 mg by mouth at bedtime.    Yes [provider]  aspirin EC 81 MG tablet Take 1 tablet (81 mg total) by  mouth daily. 08/18/19 08/17/20 Yes Nicole Kindred A, DO  atorvastatin (LIPITOR) 20 MG tablet Take 20 mg by mouth daily.   Yes [provider]  budesonide-formoterol (SYMBICORT) 160-4.5 MCG/ACT inhaler Inhale 2 puffs into the lungs 2 (two) times daily.   Yes [provider]  celecoxib (CELEBREX) 200 MG capsule Take 200 mg by mouth daily.   Yes [provider]  cetirizine (ZYRTEC) 10 MG tablet Take 10 mg by mouth daily as needed for allergies.    Yes [provider]  clopidogrel (PLAVIX) 75 MG tablet Take 75 mg by mouth daily. 09/08/19  Yes [provider]  montelukast (SINGULAIR) 10 MG tablet Take 10 mg by mouth at bedtime. 08/02/19  Yes [provider]  nortriptyline (PAMELOR) 10 MG capsule Take 20 mg by mouth at bedtime. 10/06/19  Yes [provider]  SPIRIVA HANDIHALER 18 MCG inhalation capsule Place 1 capsule into inhaler and inhale daily.  08/05/19  Yes [provider]  calcium carbonate (OS-CAL) 1250 (500 Ca) MG chewable tablet Chew 500 mg by mouth daily.    [provider]  Coenzyme Q10 (COQ10) 100 MG CAPS Take 100 mg by mouth daily.     [provider]  ergocalciferol (VITAMIN D2) 1.25 MG (50000 UT) capsule Take 50,000 Units by mouth once a week.    [provider]     ALLERGIES:  Allergies  Allergen Reactions  . Amoxicillin Other (See Comments)    Has patient had a PCN reaction causing immediate rash, facial/tongue/throat swelling, SOB or lightheadedness with hypotension: Yes Has patient had a PCN reaction causing severe rash involving mucus membranes or skin necrosis: Yes Has patient had a PCN reaction that required hospitalization:yes Has patient had a PCN reaction occurring within the last 10 years: yes If all of the above answers are "NO", then may proceed with Cephalosporin use.   . Prednisone Other (See Comments)    Full body rash      SOCIAL HISTORY:  Social History    Socioeconomic History  . Marital status: Divorced    Spouse name: Not on file  . Number of children: Not on file  . Years of education: Not on file  . Highest education level: Not on file  Occupational History  . Not on file  Tobacco Use  . Smoking status: Former Smoker    Packs/day: 0.25    Quit date: 07/17/2019    Years since quitting: 0.2  . Smokeless tobacco: Never Used  Vaping Use  . Vaping Use: Never used  Substance and Sexual Activity  . Alcohol use: No  . Drug use: Yes    Types: Marijuana    Comment: occasionally  . Sexual activity: Not on file  Other Topics Concern  . Not on file  Social History Narrative  . Not on file   Social Determinants of Health   Financial Resource Strain:   . Difficulty of Paying Living Expenses:   Food Insecurity:   . Worried About Charity fundraiser in the Last Year:   . Arboriculturist in the Last Year:   Transportation Needs:   . Film/video editor (Medical):   Marland Kitchen Lack of Transportation (Non-Medical):   Physical Activity:   . Days of Exercise per Week:   . Minutes of Exercise per Session:   Stress:   . Feeling of Stress :   Social Connections:   . Frequency of Communication with Friends and Family:   . Frequency of Social Gatherings with Friends and Family:   . Attends Religious Services:   . Active Member of Clubs or Organizations:   . Attends Archivist Meetings:   Marland Kitchen Marital Status:   Intimate Partner Violence:   . Fear of Current or Ex-Partner:   . Emotionally Abused:   Marland Kitchen Physically Abused:   . Sexually Abused:       FAMILY HISTORY:  Family History  Problem Relation Age of Onset  . Breast cancer Sister   . Breast cancer Paternal Grandmother      REVIEW OF SYSTEMS:  Constitutional: denies weight loss, fever, chills, or sweats  Eyes: denies any other vision changes, history of eye injury  ENT: denies sore throat, hearing problems  Respiratory: denies shortness of breath, wheezing   Cardiovascular: denies chest pain, palpitations  Gastrointestinal: Positive abdominal pain, nausea.  Negative vomiting Genitourinary: denies burning with urination or urinary frequency Musculoskeletal: denies any other joint pains or cramps  Skin: denies any other rashes or skin discolorations  Neurological: denies any other headache, dizziness, weakness  Psychiatric: denies any other depression, anxiety   All other review of systems were negative   VITAL SIGNS:  Temp:  [98.7 F (37.1 C)] 98.7 F (37.1 C) (07/20 2113) Pulse Rate:  [86-122] 86 (07/20 2113) Resp:  [18-22] 18 (07/20 2113) BP: (99-104)/(68-70) 104/68 (07/20 2113) SpO2:  [90 %-98 %] 98 % (07/20  2113) Weight:  [78 kg] 78 kg (07/20 1401)     Height: 5\' 2"  (157.5 cm) Weight: 78 kg BMI (Calculated): 31.45   INTAKE/OUTPUT:  This shift: No intake/output data recorded.  Last 2 shifts: @IOLAST2SHIFTS @   PHYSICAL EXAM:  Constitutional:  -- Normal body habitus  -- Awake, alert, and oriented x3  Eyes:  -- Pupils equally round and reactive to light  -- No scleral icterus  Ear, nose, and throat:  -- No jugular venous distension  Pulmonary:  -- No crackles  -- Equal breath sounds bilaterally -- Breathing non-labored at rest Cardiovascular:  -- S1, S2 present  -- No pericardial rubs Gastrointestinal:  -- Abdomen soft, nontender, non-distended, no guarding or rebound tenderness -- No abdominal masses appreciated, pulsatile or otherwise  Musculoskeletal and Integumentary:  -- Wounds: None appreciated -- Extremities: B/L UE and LE FROM, hands and feet warm, no edema  Neurologic:  -- Motor function: intact and symmetric -- Sensation: intact and symmetric   Labs:  CBC Latest Ref Rng & Units 10/14/2019 08/16/2019 05/07/2017  WBC 4.0 - 10.5 K/uL 16.7(H) 7.7 5.3  Hemoglobin 12.0 - 15.0 g/dL 13.2 13.0 16.8(H)  Hematocrit 36 - 46 % 37.9 36.0 48.0(H)  Platelets 150 - 400 K/uL 163 94(L) 201   CMP Latest Ref Rng & Units  10/14/2019 08/18/2019 08/18/2019  Glucose 70 - 99 mg/dL 127(H) - 129(H)  BUN 6 - 20 mg/dL 17 - 10  Creatinine 0.44 - 1.00 mg/dL 0.76 - 0.49  Sodium 135 - 145 mmol/L 131(L) 128(L) 129(L)  Potassium 3.5 - 5.1 mmol/L 4.2 - 4.1  Chloride 98 - 111 mmol/L 93(L) - 98  CO2 22 - 32 mmol/L 27 - 25  Calcium 8.9 - 10.3 mg/dL 9.2 - 8.5(L)  Total Protein 6.5 - 8.1 g/dL 9.0(H) - -  Total Bilirubin 0.3 - 1.2 mg/dL 1.2 - -  Alkaline Phos 38 - 126 U/L 97 - -  AST 15 - 41 U/L 24 - -  ALT 0 - 44 U/L 22 - -    Imaging studies:  I personally evaluated the CT scan of the abdomen and pelvis.  The appendix looks thickened but no free air or free fluid.  There is fat stranding around the appendix.  Assessment/Plan:  54 y.o. female with acute appendicitis, complicated by pertinent comorbidities including recent stroke, CHF, COPD, coronary disease.  Patient with history and physical exam consistent with appendicitis.  CT scan of the abdomen and pelvis shows thickening appendix with fat stranding but no free air or free fluid.  There is leukocytosis of 16,000.  At the moment of my evaluation the patient reported the pain has improved significantly.  The pain at this moment is minimal.  On physical exam there is no acute abdomen.  Due to the fact the patient had recent stroke and is currently on Plavix and aspirin and she is also with severe COPD and CHF and coronary artery disease I think that is reasonable to consider antibiotic treatment of appendicitis.  I will consult pulmonology, cardiology and neurology for perioperative evaluation and recommendation in case patient does not respond to antibiotic therapy and will need surgery for appendectomy.  At this moment patient without shortness of breath or chest pain.  Last echo showed ejection fraction of 60 to 65%.  Patient stopped smoking 3 months ago.  Patient understand that if she does not respond to IV antibiotic therapy she might need urgent surgical management.  We will  continue to evaluate closely.  Arnold Long, MD

## 2019-10-14 NOTE — ED Provider Notes (Signed)
[facility] Emergency Department Provider Note  ____________________________________________   First MD Initiated Contact with Patient 10/14/19 1649     (approximate)  I have reviewed the triage vital signs and the nursing notes.   HISTORY  Chief Complaint Abdominal Pain    HPI Stephanie Ross is a 54 y.o. female with COPD, CHF, cancer who comes in with right lower quadrant pain.  Patient reports having 3 to 4 days of right lower quadrant pain that is moderate, constant, nothing makes better, nothing makes it worse.  She does report maybe a little bit of pain with urination as well.  She reports that she has COPD but she is on 2 L of oxygen and states that her shortness of breath seems to be getting worse but she is not sure that just from the pain.  She has a history of CHF listed in the chart but she denies being on any diuretics I do not see the echo in her chart.  She does have a little bit of nausea but denies any vomiting or diarrhea with it.   Past Medical History:  Diagnosis Date  . Asthma   . Cancer ([place])   . CHF (congestive heart failure) ([place])   . COPD (chronic obstructive pulmonary disease) ([place])   . Hypertension     Patient Active Problem List   Diagnosis Date Noted  . CVA (cerebral vascular accident) ([place]) 08/17/2019    Past Surgical History:  Procedure Laterality Date  . BILATERAL CARPAL TUNNEL RELEASE    . CERVICAL ABLATION    . ROTATOR CUFF REPAIR Right     Prior to Admission medications   Medication Sig Start Date End Date Taking? Authorizing Provider  albuterol (PROVENTIL HFA;VENTOLIN HFA) 108 (90 Base) MCG/ACT inhaler Inhale 2 puffs into the lungs every 4 (four) hours as needed for wheezing or shortness of breath. 05/07/17   Noemi Chapel, MD  amLODipine (NORVASC) 5 MG tablet Take 5 mg by mouth at bedtime.     [provider]  aspirin EC 81 MG tablet Take 1 tablet (81 mg total) by mouth daily. 08/18/19 08/17/20   Nicole Kindred A, DO  atorvastatin (LIPITOR) 20 MG tablet Take 20 mg by mouth daily.    [provider]  budesonide-formoterol (SYMBICORT) 160-4.5 MCG/ACT inhaler Inhale 2 puffs into the lungs 2 (two) times daily.    [provider]  celecoxib (CELEBREX) 200 MG capsule Take 200 mg by mouth daily.    [provider]  cetirizine (ZYRTEC) 10 MG tablet Take 10 mg by mouth daily as needed.     [provider]  Coenzyme Q10 (COQ10) 200 MG CAPS Take 1 capsule by mouth daily.     [provider]  ergocalciferol (VITAMIN D2) 1.25 MG (50000 UT) capsule Take 50,000 Units by mouth once a week.    [provider]  fluticasone (FLONASE) 50 MCG/ACT nasal spray Place 2 sprays into both nostrils daily.     [provider]  montelukast (SINGULAIR) 10 MG tablet Take 10 mg by mouth at bedtime. 08/02/19   [provider]  omeprazole (PRILOSEC) 20 MG capsule Take 20 mg by mouth every morning. 08/11/19   [provider]  SPIRIVA HANDIHALER 18 MCG inhalation capsule 1 capsule daily. 08/05/19   [provider]    Allergies Amoxicillin and Prednisone  Family History  Problem Relation Age of Onset  . Breast cancer Sister   . Breast cancer Paternal Grandmother  Social History Social History   Tobacco Use  . Smoking status: Former Smoker    Packs/day: 0.25    Quit date: 07/17/2019    Years since quitting: 0.2  . Smokeless tobacco: Never Used  Vaping Use  . Vaping Use: Never used  Substance Use Topics  . Alcohol use: No  . Drug use: Yes    Types: Marijuana    Comment: occasionally      Review of Systems Constitutional: No fever/chills Eyes: No visual changes. ENT: No sore throat. Cardiovascular: Denies chest pain. Respiratory: Shortness of breath. Gastrointestinal: Abdominal pain, nausea no diarrhea.  No constipation. Genitourinary: Negative for dysuria. Musculoskeletal: Negative for back pain. Skin:  Negative for rash. Neurological: Negative for headaches, focal weakness or numbness. All other ROS negative ____________________________________________   PHYSICAL EXAM:  VITAL SIGNS: ED Triage Vitals  Enc Vitals Group     BP 10/14/19 1359 99/70     Pulse Rate 10/14/19 1359 (!) 122     Resp 10/14/19 1359 20     Temp 10/14/19 1359 98.7 F (37.1 C)     Temp Source 10/14/19 1359 Oral     SpO2 10/14/19 1359 96 %     Weight 10/14/19 1401 172 lb (78 kg)     Height 10/14/19 1401 5\' 2"  (1.575 m)     Head Circumference --      Peak Flow --      Pain Score 10/14/19 1400 8     Pain Loc --      Pain Edu? --      Excl. in [place]? --     Constitutional: Alert and oriented. Well appearing and in no acute distress. Eyes: Conjunctivae are normal. EOMI. Head: Atraumatic. Nose: No congestion/rhinnorhea. Mouth/Throat: Mucous membranes are moist.   Neck: No stridor. Trachea Midline. FROM Cardiovascular: Tachycardic regular rhythm. Grossly normal heart sounds.  Good peripheral circulation. Respiratory: Normal respiratory effort.  No retractions. Lungs CTAB.  On 2 L Gastrointestinal: Soft but tender in the lower abdomen. No distention. No abdominal bruits.  Musculoskeletal: No lower extremity tenderness nor edema.  No joint effusions. Neurologic:  Normal speech and language. No gross focal neurologic deficits are appreciated.  Skin:  Skin is warm, dry and intact. No rash noted. Psychiatric: Mood and affect are normal. Speech and behavior are normal. GU: Deferred   ____________________________________________   LABS (all labs ordered are listed, but only abnormal results are displayed)  Labs Reviewed  COMPREHENSIVE METABOLIC PANEL - Abnormal; Notable for the following components:      Result Value   Sodium 131 (*)    Chloride 93 (*)    Glucose, Bld 127 (*)    Total Protein 9.0 (*)    All other components within normal limits  CBC - Abnormal; Notable for the following components:   WBC  16.7 (*)    All other components within normal limits  URINALYSIS, COMPLETE (UACMP) WITH MICROSCOPIC - Abnormal; Notable for the following components:   Color, Urine YELLOW (*)    APPearance HAZY (*)    Bacteria, UA RARE (*)    All other components within normal limits  SARS CORONAVIRUS 2 BY RT PCR (HOSPITAL ORDER, [facility])  LIPASE, BLOOD  LACTIC ACID, PLASMA  LACTIC ACID, PLASMA  TROPONIN I (HIGH SENSITIVITY)  TROPONIN I (HIGH SENSITIVITY)   ____________________________________________   ED ECG REPORT I, Vanessa [place], the attending physician, personally viewed and interpreted this ECG.  Sinus tachycardia rate of 109,  no ST elevations but does have T wave inversions in 2 3 aVF V5 and V6 with a little bit of ST depression.  Normal intervals. ____________________________________________  RADIOLOGY   Official radiology report(s): CT Angio Chest PE W and/or Wo Contrast  Result Date: 10/14/2019 CLINICAL DATA:  Shortness of breath, right lower quadrant pain, nausea for 2 days, COPD EXAM: CT ANGIOGRAPHY CHEST CT ABDOMEN AND PELVIS WITH CONTRAST TECHNIQUE: Multidetector CT imaging of the chest was performed using the standard protocol during bolus administration of intravenous contrast. Multiplanar CT image reconstructions and MIPs were obtained to evaluate the vascular anatomy. Multidetector CT imaging of the abdomen and pelvis was performed using the standard protocol during bolus administration of intravenous contrast. CONTRAST:  115mL OMNIPAQUE IOHEXOL 350 MG/ML SOLN COMPARISON:  05/07/2017 FINDINGS: CTA CHEST FINDINGS Cardiovascular: This is a technically adequate evaluation of the pulmonary vasculature. There are no filling defects or pulmonary emboli. The heart is unremarkable without pericardial effusion. No evidence of thoracic aortic aneurysm or dissection. Mild atherosclerosis of the aorta and coronary vessels. Mediastinum/Nodes: No enlarged mediastinal,  hilar, or axillary lymph nodes. Thyroid gland, trachea, and esophagus demonstrate no significant findings. Lungs/Pleura: No acute airspace disease, effusion, or pneumothorax. Scattered atelectasis within the right middle lobe and lingula. Upper lobe predominant emphysema unchanged. Central airways are patent. Musculoskeletal: No acute or destructive bony lesions. Reconstructed images demonstrate no additional findings. Review of the MIP images confirms the above findings. CT ABDOMEN and PELVIS FINDINGS Hepatobiliary: No focal liver abnormality is seen. No gallstones, gallbladder wall thickening, or biliary dilatation. Pancreas: Unremarkable. No pancreatic ductal dilatation or surrounding inflammatory changes. Spleen: Normal in size without focal abnormality. Adrenals/Urinary Tract: There is a stable 1.1 cm indeterminate hypodensity upper pole left kidney, mean attenuation 21 Hounsfield units, likely a small cyst. Otherwise the kidneys enhance normally and symmetrically. No urinary tract calculi or obstructive uropathy. The bladder is unremarkable. The adrenals are normal. Stomach/Bowel: A dilated inflamed appendix is seen within the right lower quadrant, measuring up to 11 mm in diameter. There is appendiceal wall thickening with periappendiceal fat stranding, consistent with acute uncomplicated appendicitis. No perforation, fluid collection, or abscess. No bowel obstruction or ileus. Vascular/Lymphatic: Aortic atherosclerosis. No enlarged abdominal or pelvic lymph nodes. Reproductive: Uterus and bilateral adnexa are unremarkable. Other: No free fluid or free gas.  No abdominal wall hernia. Musculoskeletal: No acute or destructive bony lesions. Reconstructed images demonstrate no additional findings. Review of the MIP images confirms the above findings. IMPRESSION: 1. Acute uncomplicated appendicitis. No perforation, fluid collection, or abscess. 2. No evidence of pulmonary embolus. 3. Stable 1.1 cm indeterminate  hypodensity upper pole left kidney, likely a small cyst. 4. Aortic Atherosclerosis (ICD10-I70.0) and Emphysema (ICD10-J43.9). Electronically Signed   By: Randa Ngo M.D.   On: 10/14/2019 19:12   CT ABDOMEN PELVIS W CONTRAST  Result Date: 10/14/2019 CLINICAL DATA:  Shortness of breath, right lower quadrant pain, nausea for 2 days, COPD EXAM: CT ANGIOGRAPHY CHEST CT ABDOMEN AND PELVIS WITH CONTRAST TECHNIQUE: Multidetector CT imaging of the chest was performed using the standard protocol during bolus administration of intravenous contrast. Multiplanar CT image reconstructions and MIPs were obtained to evaluate the vascular anatomy. Multidetector CT imaging of the abdomen and pelvis was performed using the standard protocol during bolus administration of intravenous contrast. CONTRAST:  171mL OMNIPAQUE IOHEXOL 350 MG/ML SOLN COMPARISON:  05/07/2017 FINDINGS: CTA CHEST FINDINGS Cardiovascular: This is a technically adequate evaluation of the pulmonary vasculature. There are no filling defects or pulmonary emboli. The  heart is unremarkable without pericardial effusion. No evidence of thoracic aortic aneurysm or dissection. Mild atherosclerosis of the aorta and coronary vessels. Mediastinum/Nodes: No enlarged mediastinal, hilar, or axillary lymph nodes. Thyroid gland, trachea, and esophagus demonstrate no significant findings. Lungs/Pleura: No acute airspace disease, effusion, or pneumothorax. Scattered atelectasis within the right middle lobe and lingula. Upper lobe predominant emphysema unchanged. Central airways are patent. Musculoskeletal: No acute or destructive bony lesions. Reconstructed images demonstrate no additional findings. Review of the MIP images confirms the above findings. CT ABDOMEN and PELVIS FINDINGS Hepatobiliary: No focal liver abnormality is seen. No gallstones, gallbladder wall thickening, or biliary dilatation. Pancreas: Unremarkable. No pancreatic ductal dilatation or surrounding  inflammatory changes. Spleen: Normal in size without focal abnormality. Adrenals/Urinary Tract: There is a stable 1.1 cm indeterminate hypodensity upper pole left kidney, mean attenuation 21 Hounsfield units, likely a small cyst. Otherwise the kidneys enhance normally and symmetrically. No urinary tract calculi or obstructive uropathy. The bladder is unremarkable. The adrenals are normal. Stomach/Bowel: A dilated inflamed appendix is seen within the right lower quadrant, measuring up to 11 mm in diameter. There is appendiceal wall thickening with periappendiceal fat stranding, consistent with acute uncomplicated appendicitis. No perforation, fluid collection, or abscess. No bowel obstruction or ileus. Vascular/Lymphatic: Aortic atherosclerosis. No enlarged abdominal or pelvic lymph nodes. Reproductive: Uterus and bilateral adnexa are unremarkable. Other: No free fluid or free gas.  No abdominal wall hernia. Musculoskeletal: No acute or destructive bony lesions. Reconstructed images demonstrate no additional findings. Review of the MIP images confirms the above findings. IMPRESSION: 1. Acute uncomplicated appendicitis. No perforation, fluid collection, or abscess. 2. No evidence of pulmonary embolus. 3. Stable 1.1 cm indeterminate hypodensity upper pole left kidney, likely a small cyst. 4. Aortic Atherosclerosis (ICD10-I70.0) and Emphysema (ICD10-J43.9). Electronically Signed   By: Randa Ngo M.D.   On: 10/14/2019 19:12    ____________________________________________   PROCEDURES  Procedure(s) performed (including Critical Care):  .Critical Care Performed by: Vanessa Loret[place], MD Authorized by: Vanessa Marshall, MD   Critical care provider statement:    Critical care time (minutes):  35   Critical care was necessary to treat or prevent imminent or life-threatening deterioration of the following conditions:  Sepsis   Critical care was time spent personally by me on the following activities:  Discussions  with consultants, evaluation of patient's response to treatment, examination of patient, ordering and performing treatments and interventions, ordering and review of laboratory studies, ordering and review of radiographic studies, pulse oximetry, re-evaluation of patient's condition, obtaining history from patient or surrogate and review of old charts     ____________________________________________   INITIAL IMPRESSION / ASSESSMENT AND PLAN / ED COURSE  Stephanie Ross was evaluated in Emergency Department on 10/14/2019 for the symptoms described in the history of present illness. She was evaluated in the context of the global COVID-19 pandemic, which necessitated consideration that the patient might be at risk for infection with the SARS-CoV-2 virus that causes COVID-19. Institutional protocols and algorithms that pertain to the evaluation of patients at risk for COVID-19 are in a state of rapid change based on information released by regulatory bodies including the CDC and federal and state organizations. These policies and algorithms were followed during the patient's care in the ED.     Patient is a 54 year old who comes in tachycardic with some low blood pressures with right lower quadrant pain.  Patient's white count is elevated.  Unclear source.  We will hold off on antibiotics at  this time given unclear source.  Will get labs to evaluate for Electra abnormalities, AKI, UTI.  Will get CT scan to evaluate for appendicitis, diverticulitis, obstruction, perforation.  Given patient reports being short of breath also get a CT PE given she is significantly tachycardic.  However patient is on 2 L of oxygen at baseline do not hear any wheezing on exam to suggest a COPD flare though she is frequently coughing.  She reports need to be turned up to 3 L due to feeling more short of breath when I went to the room her oxygen was not even in her nose so I suspect that this is why she had to be turned  up.  Given patient's tachycardia we will give a liter of fluids and give some IV Dilaudid and IV Zofran to help with pain and nausea while waiting CT scans  Patient's chloride and sodium are slightly low but she is getting 1 L of fluids  Her white count is elevated at 16.7  UA no evidence of UTI  7:25 PM at this time is identified that patient had appendicitis, does have + SIRS criteria.  Patient was given another liter of fluid.  Patient's heart rate has come down to the 80s and blood pressure stabilized.  Holding on full fluid resuscitation given a report of CHF I cannot find an echocardiogram.  Her lactate was normal which is reassuring.  She looks well-hydrated on exam and pain is better controlled.  Patient does have severe allergy to amoxicillin and she is never been on ceftriaxone or cephalosporin for what I can tell in the chart so we will start her on Cipro and Flagyl.  I did discuss with the surgery team Dr. Peyton Najjar who will come down and evaluate patient.      ____________________________________________   FINAL CLINICAL IMPRESSION(S) / ED DIAGNOSES   Final diagnoses:  Appendicitis, unspecified appendicitis type  Sepsis, due to unspecified organism, unspecified whether acute organ dysfunction present [facility])      MEDICATIONS GIVEN DURING THIS VISIT:  Medications  sodium chloride flush (NS) 0.9 % injection 3 mL (3 mLs Intravenous Not Given 10/14/19 1957)  sodium chloride 0.9 % bolus 1,000 mL (has no administration in time range)  sodium chloride 0.9 % bolus 1,000 mL (1,000 mLs Intravenous New Bag/Given 10/14/19 1828)  HYDROmorphone (DILAUDID) injection 0.5 mg (0.5 mg Intravenous Given 10/14/19 1827)  ondansetron (ZOFRAN) injection 4 mg (4 mg Intravenous Given 10/14/19 1827)  iohexol (OMNIPAQUE) 350 MG/ML injection 100 mL (100 mLs Intravenous Contrast Given 10/14/19 1833)  metroNIDAZOLE (FLAGYL) IVPB 500 mg (500 mg Intravenous New Bag/Given 10/14/19 2012)  ciprofloxacin (CIPRO)  IVPB 400 mg (400 mg Intravenous New Bag/Given 10/14/19 2013)     ED Discharge Orders    None       Note:  This document was prepared using Dragon voice recognition software and may include unintentional dictation errors.   Vanessa [place], MD 10/14/19 2119

## 2019-10-14 NOTE — ED Triage Notes (Signed)
Pt c/o RLQ pain with nausea for the past 2 days. Denies vomiting or diarrhea.

## 2019-10-15 ENCOUNTER — Encounter: Payer: Self-pay | Admitting: General Surgery

## 2019-10-15 DIAGNOSIS — I351 Nonrheumatic aortic (valve) insufficiency: Secondary | ICD-10-CM | POA: Diagnosis not present

## 2019-10-15 DIAGNOSIS — Z01818 Encounter for other preprocedural examination: Secondary | ICD-10-CM | POA: Insufficient documentation

## 2019-10-15 DIAGNOSIS — Z8673 Personal history of transient ischemic attack (TIA), and cerebral infarction without residual deficits: Secondary | ICD-10-CM

## 2019-10-15 DIAGNOSIS — I10 Essential (primary) hypertension: Secondary | ICD-10-CM

## 2019-10-15 DIAGNOSIS — K3580 Unspecified acute appendicitis: Principal | ICD-10-CM

## 2019-10-15 DIAGNOSIS — K37 Unspecified appendicitis: Secondary | ICD-10-CM

## 2019-10-15 LAB — SURGICAL PCR SCREEN
MRSA, PCR: NEGATIVE
Staphylococcus aureus: NEGATIVE

## 2019-10-15 LAB — BASIC METABOLIC PANEL
Anion gap: 10 (ref 5–15)
BUN: 15 mg/dL (ref 6–20)
CO2: 28 mmol/L (ref 22–32)
Calcium: 8.5 mg/dL — ABNORMAL LOW (ref 8.9–10.3)
Chloride: 97 mmol/L — ABNORMAL LOW (ref 98–111)
Creatinine, Ser: 0.78 mg/dL (ref 0.44–1.00)
GFR calc Af Amer: >60 mL/min (ref >60)
GFR calc non Af Amer: >60 mL/min (ref >60)
Glucose, Bld: 92 mg/dL (ref 70–99)
Potassium: 3.6 mmol/L (ref 3.5–5.1)
Sodium: 135 mmol/L (ref 135–145)

## 2019-10-15 LAB — CBC
HCT: 32.5 % — ABNORMAL LOW (ref 36.0–46.0)
Hemoglobin: 11 g/dL — ABNORMAL LOW (ref 12.0–15.0)
MCH: 29.5 pg (ref 26.0–34.0)
MCHC: 33.8 g/dL (ref 30.0–36.0)
MCV: 87.1 fL (ref 80.0–100.0)
Platelets: 153 10*3/uL (ref 150–400)
RBC: 3.73 MIL/uL — ABNORMAL LOW (ref 3.87–5.11)
RDW: 15.4 % (ref 11.5–15.5)
WBC: 11.7 10*3/uL — ABNORMAL HIGH (ref 4.0–10.5)
nRBC: 0 % (ref 0.0–0.2)

## 2019-10-15 MED ORDER — IPRATROPIUM-ALBUTEROL 0.5-2.5 (3) MG/3ML IN SOLN
3.0000 mL | Freq: Four times a day (QID) | RESPIRATORY_TRACT | Status: DC
  Administered 2019-10-15 – 2019-10-18 (×11): 3 mL via RESPIRATORY_TRACT
  Filled 2019-10-15 (×11): qty 3

## 2019-10-15 NOTE — ED Notes (Signed)
Report received from [place], [place]. Pt is A&Ox4 and NAD at this time. Pt watching TV in ED stretcher, stretcher locked and in lowest position. Pt c/o RLQ pain at this time denies NV. No other needs expressed at this time. Pt V/S WNL. Call bell within reach.

## 2019-10-15 NOTE — ED Notes (Signed)
Pt ambulatory to the restroom at this time.  

## 2019-10-15 NOTE — Consult Note (Signed)
Pulmonary Medicine          Date: 10/15/2019,   MRN# [id number] Stephanie Ross [birth year]     AdmissionWeight: 78 kg                 CurrentWeight: 78 kg  Referring Physician: Dr Windell Moment    CHIEF COMPLAINT:   Abdominal pain   HISTORY OF PRESENT ILLNESS   This is a 54 yo F with hx of lifelong smoking with centrilobular emphysema and COPD. She has 44 pack year smoking history and stopped in April 2021. She has had CVA x2 with most recent being 2 months ago and is on dual antiplatelet therapy for this and CAD. She shares that day prior to admission she was feeling weak and felt ill but then noted worsening abdominal pain. She denies having recent trauma or sick contacts. She was evaluated in ED and found to have signs of acute appendicitis via CT. She was evaluated by surgery and subsequently had pulmonary consultation placed to further evaluate respiratory status prior to surgery in context of advanced COPD.    PAST MEDICAL HISTORY   Past Medical History:  Diagnosis Date  . Asthma   . Cancer ([place])   . COPD (chronic obstructive pulmonary disease) ([place])   . Hypertension      SURGICAL HISTORY   Past Surgical History:  Procedure Laterality Date  . BILATERAL CARPAL TUNNEL RELEASE    . CERVICAL ABLATION    . ROTATOR CUFF REPAIR Right      FAMILY HISTORY   Family History  Problem Relation Age of Onset  . Breast cancer Sister   . Breast cancer Paternal Grandmother      SOCIAL HISTORY   Social History   Tobacco Use  . Smoking status: Former Smoker    Packs/day: 0.25    Quit date: 07/17/2019    Years since quitting: 0.2  . Smokeless tobacco: Never Used  Vaping Use  . Vaping Use: Never used  Substance Use Topics  . Alcohol use: No  . Drug use: Yes    Types: Marijuana    Comment: occasionally     MEDICATIONS    Home Medication:  Current Outpatient Rx  . Order #: [id number] Class: Print  . Order #: [id number] Class: Historical Med  .  Order #: [id number] Class: OTC  . Order #: [id number] Class: Historical Med  . Order #: [id number] Class: Historical Med  . Order #: [id number] Class: Historical Med  . Order #: [id number] Class: Historical Med  . Order #: [id number] Class: Historical Med  . Order #: [id number] Class: Historical Med  . Order #: [id number] Class: Historical Med  . Order #: [id number] Class: Historical Med  . Order #: [id number] Class: Historical Med  . Order #: [id number] Class: Historical Med  . Order #: [id number] Class: Historical Med    Current Medication:  Current Facility-Administered Medications:  .  0.9 %  sodium chloride infusion, , Intravenous, Continuous, Cintron-Diaz, [name], MD, Last Rate: 50 mL/hr at 10/15/19 1046, Rate Verify at 10/15/19 1046 .  acetaminophen (TYLENOL) tablet 650 mg, 650 mg, Oral, Q6H PRN **OR** acetaminophen (TYLENOL) suppository 650 mg, 650 mg, Rectal, Q6H PRN, Cintron-Diaz, [name], MD .  albuterol (PROVENTIL) (2.5 MG/3ML) 0.083% nebulizer solution 3 mL, 3 mL, Inhalation, Q4H PRN, [name], MD .  aspirin EC tablet 81 mg, 81 mg, Oral, Daily, Cintron-Diaz, [name], MD, 81 mg at 10/15/19 1048 .  atorvastatin (LIPITOR) tablet 20 mg, 20 mg, Oral, Daily, [name], MD, 20 mg at 10/15/19 1048 .  ciprofloxacin (  CIPRO) IVPB 400 mg, 400 mg, Intravenous, Q12H, Stopped at 10/15/19 0250 **AND** metroNIDAZOLE (FLAGYL) IVPB 500 mg, 500 mg, Intravenous, Q8H, [name], MD, Stopped at 10/15/19 1040 .  enoxaparin (LOVENOX) injection 40 mg, 40 mg, Subcutaneous, Q24H, Cintron-Diaz, [name], MD, 40 mg at 10/15/19 1049 .  fluticasone furoate-vilanterol (BREO ELLIPTA) 200-25 MCG/INH 1 puff, 1 puff, Inhalation, Daily, [name], MD, 1 puff at 10/15/19 1050 .  HYDROcodone-acetaminophen (NORCO/VICODIN) 5-325 MG per tablet 1-2 tablet, 1-2 tablet, Oral, Q4H PRN, [name], MD, 1 tablet at 10/15/19 0737 .  loratadine (CLARITIN) tablet 10 mg, 10 mg, Oral, Daily,  [name], MD, 10 mg at 10/15/19 1048 .  montelukast (SINGULAIR) tablet 10 mg, 10 mg, Oral, QHS, [name], MD, 10 mg at 10/15/19 0203 .  morphine 4 MG/ML injection 4 mg, 4 mg, Intravenous, Q4H PRN, [name], MD .  nortriptyline (PAMELOR) capsule 20 mg, 20 mg, Oral, QHS, Cintron-Diaz, [name], MD, 20 mg at 10/15/19 0202 .  ondansetron (ZOFRAN-ODT) disintegrating tablet 4 mg, 4 mg, Oral, Q6H PRN **OR** ondansetron (ZOFRAN) injection 4 mg, 4 mg, Intravenous, Q6H PRN, Windell Moment, [name], MD .  pantoprazole (PROTONIX) injection 40 mg, 40 mg, Intravenous, QHS, Cintron-Diaz, [name], MD, 40 mg at 10/15/19 0038 .  sodium chloride flush (NS) 0.9 % injection 3 mL, 3 mL, Intravenous, Once, [name], MD .  tiotropium (SPIRIVA) inhalation capsule ([facility] use ONLY) 18 mcg, 1 capsule, Inhalation, Daily, [name], MD, 18 mcg at 10/15/19 1049  Current Outpatient Medications:  .  albuterol (PROVENTIL HFA;VENTOLIN HFA) 108 (90 Base) MCG/ACT inhaler, Inhale 2 puffs into the lungs every 4 (four) hours as needed for wheezing or shortness of breath., Disp: 1 Inhaler, Rfl: 3 .  amLODipine (NORVASC) 5 MG tablet, Take 5 mg by mouth at bedtime. , Disp: , Rfl:  .  aspirin EC 81 MG tablet, Take 1 tablet (81 mg total) by mouth daily., Disp: , Rfl:  .  atorvastatin (LIPITOR) 20 MG tablet, Take 20 mg by mouth daily., Disp: , Rfl:  .  budesonide-formoterol (SYMBICORT) 160-4.5 MCG/ACT inhaler, Inhale 2 puffs into the lungs 2 (two) times daily., Disp: , Rfl:  .  celecoxib (CELEBREX) 200 MG capsule, Take 200 mg by mouth daily., Disp: , Rfl:  .  cetirizine (ZYRTEC) 10 MG tablet, Take 10 mg by mouth daily as needed for allergies. , Disp: , Rfl:  .  clopidogrel (PLAVIX) 75 MG tablet, Take 75 mg by mouth daily., Disp: , Rfl:  .  montelukast (SINGULAIR) 10 MG tablet, Take 10 mg by mouth at bedtime., Disp: , Rfl:  .  nortriptyline (PAMELOR) 10 MG capsule, Take 20 mg by mouth  at bedtime., Disp: , Rfl:  .  SPIRIVA HANDIHALER 18 MCG inhalation capsule, Place 1 capsule into inhaler and inhale daily. , Disp: , Rfl:  .  calcium carbonate (OS-CAL) 1250 (500 Ca) MG chewable tablet, Chew 500 mg by mouth daily., Disp: , Rfl:  .  Coenzyme Q10 (COQ10) 100 MG CAPS, Take 100 mg by mouth daily. , Disp: , Rfl:  .  ergocalciferol (VITAMIN D2) 1.25 MG (50000 UT) capsule, Take 50,000 Units by mouth once a week., Disp: , Rfl:     ALLERGIES   Amoxicillin and Prednisone     REVIEW OF SYSTEMS    Review of Systems:  Gen:  Denies  fever, sweats, chills weigh loss  HEENT: Denies blurred vision, double vision, ear pain, eye pain, hearing loss, nose bleeds, sore throat Cardiac:  No dizziness, chest  pain or heaviness, chest tightness,edema Resp:   Denies cough or sputum porduction, shortness of breath,wheezing, hemoptysis,  Gi: Denies swallowing difficulty, stomach pain, nausea or vomiting, diarrhea, constipation, bowel incontinence Gu:  Denies bladder incontinence, burning urine Ext:   Denies Joint pain, stiffness or swelling Skin: Denies  skin rash, easy bruising or bleeding or hives Endoc:  Denies polyuria, polydipsia , polyphagia or weight change Psych:   Denies depression, insomnia or hallucinations   Other:  All other systems negative   VS: BP (!) 102/59 (BP Location: Left Arm)   Pulse 85   Temp 97.9 F (36.6 C) (Oral)   Resp 18   Ht 5\' 2"  (1.575 m)   Wt 78 kg   SpO2 96%   BMI 31.46 kg/m      PHYSICAL EXAM    GENERAL:NAD, no fevers, chills, no weakness no fatigue HEAD: Normocephalic, atraumatic.  EYES: Pupils equal, round, reactive to light. Extraocular muscles intact. No scleral icterus.  MOUTH: Moist mucosal membrane. Dentition intact. No abscess noted.  EAR, NOSE, THROAT: Clear without exudates. No external lesions.  NECK: Supple. No thyromegaly. No nodules. No JVD.  PULMONARY: Diffuse coarse rhonchi right sided +wheezes CARDIOVASCULAR: S1 and S2.  Regular rate and rhythm. No murmurs, rubs, or gallops. No edema. Pedal pulses 2+ bilaterally.  GASTROINTESTINAL: Soft, nontender, nondistended. No masses. Positive bowel sounds. No hepatosplenomegaly.  MUSCULOSKELETAL: No swelling, clubbing, or edema. Range of motion full in all extremities.  NEUROLOGIC: Cranial nerves II through XII are intact. No gross focal neurological deficits. Sensation intact. Reflexes intact.  SKIN: No ulceration, lesions, rashes, or cyanosis. Skin warm and dry. Turgor intact.  PSYCHIATRIC: Mood, affect within normal limits. The patient is awake, alert and oriented x 3. Insight, judgment intact.       IMAGING    CT Angio Chest PE W and/or Wo Contrast  Result Date: 10/14/2019 CLINICAL DATA:  Shortness of breath, right lower quadrant pain, nausea for 2 days, COPD EXAM: CT ANGIOGRAPHY CHEST CT ABDOMEN AND PELVIS WITH CONTRAST TECHNIQUE: Multidetector CT imaging of the chest was performed using the standard protocol during bolus administration of intravenous contrast. Multiplanar CT image reconstructions and MIPs were obtained to evaluate the vascular anatomy. Multidetector CT imaging of the abdomen and pelvis was performed using the standard protocol during bolus administration of intravenous contrast. CONTRAST:  175mL OMNIPAQUE IOHEXOL 350 MG/ML SOLN COMPARISON:  05/07/2017 FINDINGS: CTA CHEST FINDINGS Cardiovascular: This is a technically adequate evaluation of the pulmonary vasculature. There are no filling defects or pulmonary emboli. The heart is unremarkable without pericardial effusion. No evidence of thoracic aortic aneurysm or dissection. Mild atherosclerosis of the aorta and coronary vessels. Mediastinum/Nodes: No enlarged mediastinal, hilar, or axillary lymph nodes. Thyroid gland, trachea, and esophagus demonstrate no significant findings. Lungs/Pleura: No acute airspace disease, effusion, or pneumothorax. Scattered atelectasis within the right middle lobe and lingula.  Upper lobe predominant emphysema unchanged. Central airways are patent. Musculoskeletal: No acute or destructive bony lesions. Reconstructed images demonstrate no additional findings. Review of the MIP images confirms the above findings. CT ABDOMEN and PELVIS FINDINGS Hepatobiliary: No focal liver abnormality is seen. No gallstones, gallbladder wall thickening, or biliary dilatation. Pancreas: Unremarkable. No pancreatic ductal dilatation or surrounding inflammatory changes. Spleen: Normal in size without focal abnormality. Adrenals/Urinary Tract: There is a stable 1.1 cm indeterminate hypodensity upper pole left kidney, mean attenuation 21 Hounsfield units, likely a small cyst. Otherwise the kidneys enhance normally and symmetrically. No urinary tract calculi or obstructive uropathy. The bladder is  unremarkable. The adrenals are normal. Stomach/Bowel: A dilated inflamed appendix is seen within the right lower quadrant, measuring up to 11 mm in diameter. There is appendiceal wall thickening with periappendiceal fat stranding, consistent with acute uncomplicated appendicitis. No perforation, fluid collection, or abscess. No bowel obstruction or ileus. Vascular/Lymphatic: Aortic atherosclerosis. No enlarged abdominal or pelvic lymph nodes. Reproductive: Uterus and bilateral adnexa are unremarkable. Other: No free fluid or free gas.  No abdominal wall hernia. Musculoskeletal: No acute or destructive bony lesions. Reconstructed images demonstrate no additional findings. Review of the MIP images confirms the above findings. IMPRESSION: 1. Acute uncomplicated appendicitis. No perforation, fluid collection, or abscess. 2. No evidence of pulmonary embolus. 3. Stable 1.1 cm indeterminate hypodensity upper pole left kidney, likely a small cyst. 4. Aortic Atherosclerosis (ICD10-I70.0) and Emphysema (ICD10-J43.9). Electronically Signed   By: Randa Ngo M.D.   On: 10/14/2019 19:12   CT ABDOMEN PELVIS W CONTRAST  Result  Date: 10/14/2019 CLINICAL DATA:  Shortness of breath, right lower quadrant pain, nausea for 2 days, COPD EXAM: CT ANGIOGRAPHY CHEST CT ABDOMEN AND PELVIS WITH CONTRAST TECHNIQUE: Multidetector CT imaging of the chest was performed using the standard protocol during bolus administration of intravenous contrast. Multiplanar CT image reconstructions and MIPs were obtained to evaluate the vascular anatomy. Multidetector CT imaging of the abdomen and pelvis was performed using the standard protocol during bolus administration of intravenous contrast. CONTRAST:  162mL OMNIPAQUE IOHEXOL 350 MG/ML SOLN COMPARISON:  05/07/2017 FINDINGS: CTA CHEST FINDINGS Cardiovascular: This is a technically adequate evaluation of the pulmonary vasculature. There are no filling defects or pulmonary emboli. The heart is unremarkable without pericardial effusion. No evidence of thoracic aortic aneurysm or dissection. Mild atherosclerosis of the aorta and coronary vessels. Mediastinum/Nodes: No enlarged mediastinal, hilar, or axillary lymph nodes. Thyroid gland, trachea, and esophagus demonstrate no significant findings. Lungs/Pleura: No acute airspace disease, effusion, or pneumothorax. Scattered atelectasis within the right middle lobe and lingula. Upper lobe predominant emphysema unchanged. Central airways are patent. Musculoskeletal: No acute or destructive bony lesions. Reconstructed images demonstrate no additional findings. Review of the MIP images confirms the above findings. CT ABDOMEN and PELVIS FINDINGS Hepatobiliary: No focal liver abnormality is seen. No gallstones, gallbladder wall thickening, or biliary dilatation. Pancreas: Unremarkable. No pancreatic ductal dilatation or surrounding inflammatory changes. Spleen: Normal in size without focal abnormality. Adrenals/Urinary Tract: There is a stable 1.1 cm indeterminate hypodensity upper pole left kidney, mean attenuation 21 Hounsfield units, likely a small cyst. Otherwise the  kidneys enhance normally and symmetrically. No urinary tract calculi or obstructive uropathy. The bladder is unremarkable. The adrenals are normal. Stomach/Bowel: A dilated inflamed appendix is seen within the right lower quadrant, measuring up to 11 mm in diameter. There is appendiceal wall thickening with periappendiceal fat stranding, consistent with acute uncomplicated appendicitis. No perforation, fluid collection, or abscess. No bowel obstruction or ileus. Vascular/Lymphatic: Aortic atherosclerosis. No enlarged abdominal or pelvic lymph nodes. Reproductive: Uterus and bilateral adnexa are unremarkable. Other: No free fluid or free gas.  No abdominal wall hernia. Musculoskeletal: No acute or destructive bony lesions. Reconstructed images demonstrate no additional findings. Review of the MIP images confirms the above findings. IMPRESSION: 1. Acute uncomplicated appendicitis. No perforation, fluid collection, or abscess. 2. No evidence of pulmonary embolus. 3. Stable 1.1 cm indeterminate hypodensity upper pole left kidney, likely a small cyst. 4. Aortic Atherosclerosis (ICD10-I70.0) and Emphysema (ICD10-J43.9). Electronically Signed   By: Randa Ngo M.D.   On: 10/14/2019 19:12  Media Information   Document Information     ASSESSMENT/PLAN   Dyspnea with minimal exertion  - due to advanced COPD with chronic broncitic phenotype  - s/p PFT today - FEV1- 27%- appreciate Respiratory therapist  -placed on BreoEllitpa - please continue  - IS at bedside for bronchopulmonary hygiene  -PT/OT  - abluterol nebulizer may change to DuOneb  -may d/c spiriva due to addition of Duoneb   Acute appendicitis   pre-operative pulmonary evaluation - overall moderate risk of post operative respiratory failure which may require prolonged mechanical ventilation, difficulty weaning from MV, increased O2 requirement.  Recommend to extubate to BIPAP for short period with transition to [place]     Thank you  for allowing me to participate in the care of this patient.     Patient/Family are satisfied with care plan and all questions have been answered.   This document was prepared using Dragon voice recognition software and may include unintentional dictation errors.     [name], M.D.  Division of [place]

## 2019-10-15 NOTE — ED Notes (Signed)
Pt given lunch tray.

## 2019-10-15 NOTE — Progress Notes (Signed)
PHARMACIST - PHYSICIAN ORDER COMMUNICATION  CONCERNING: P&T Medication Policy on Herbal Medications  DESCRIPTION:  This patient's order for:  Co-Q-10  has been noted.  This product(s) is classified as an "herbal" or natural product. Due to a lack of definitive safety studies or FDA approval, nonstandard manufacturing practices, plus the potential risk of unknown drug-drug interactions while on inpatient medications, the Pharmacy and Therapeutics Committee does not permit the use of "herbal" or natural products of this type within [facility].   ACTION TAKEN: The pharmacy department is unable to verify this order at this time. Please reevaluate patient's clinical condition at discharge and address if the herbal or natural product(s) should be resumed at that time.   

## 2019-10-15 NOTE — Consult Note (Signed)
Cardiology Consultation:   Patient ID: Stephanie Ross MRN: [id number]; DOB: [birth year]  Admit date: 10/14/2019 Date of Consult: 10/15/2019  Primary Care Provider: [facility] Cardiologist: [place] [facility] Electrophysiologist:  None    Patient Profile:   Stephanie Ross is a 54 y.o. female with a hx of hypertension, COPD who is being seen today for the evaluation of cardiac risk stratification at the request of Dr. Wendee Copp  History of Present Illness:   [name] hypertension, former smoker x40+ years, asthma, COPD on 2 L home oxygen, CVA 07/2019 who presents due to abdominal pain.  Patient describes a 4-day history of right lower abdominal discomfort.  Denies nausea, vomiting, fevers or chills.  Work-up upon presentation revealed appendicitis.  Patient started on antibiotics with improvement in her symptoms.    She was recently admitted to the hospital 2 months ago for left-sided weakness.  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  Carotid ultrasound revealed minimal plaque with no significant stenosis.  Patient was started on aspirin, Plavix, Lipitor.  She went through physical therapy and was recently discharged from physical therapy due to regaining muscle strength in her left arm and left leg.  She denies any history of heart disease such as heart failure or MI.  Echocardiogram obtained during last admission on 08/17/2019 showed normal systolic and diastolic function, EF 60 to 65%, moderate AI.  She denies any symptoms of chest pain.  States having shortness of breath which is chronic ongoing for over 2 years.  Diagnosed with COPD roughly 2 years ago.   Past Medical History:  Diagnosis Date  . Asthma   . Cancer ([place])   . COPD (chronic obstructive pulmonary disease) ([place])   . Hypertension     Past Surgical History:  Procedure Laterality Date  . BILATERAL CARPAL TUNNEL  RELEASE    . CERVICAL ABLATION    . ROTATOR CUFF REPAIR Right      Home Medications:  Prior to Admission medications   Medication Sig Start Date End Date Taking? Authorizing Provider  albuterol (PROVENTIL HFA;VENTOLIN HFA) 108 (90 Base) MCG/ACT inhaler Inhale 2 puffs into the lungs every 4 (four) hours as needed for wheezing or shortness of breath. 05/07/17  Yes Noemi Chapel, MD  amLODipine (NORVASC) 5 MG tablet Take 5 mg by mouth at bedtime.    Yes [provider]  aspirin EC 81 MG tablet Take 1 tablet (81 mg total) by mouth daily. 08/18/19 08/17/20 Yes Nicole Kindred A, DO  atorvastatin (LIPITOR) 20 MG tablet Take 20 mg by mouth daily.   Yes [provider]  budesonide-formoterol (SYMBICORT) 160-4.5 MCG/ACT inhaler Inhale 2 puffs into the lungs 2 (two) times daily.   Yes [provider]  celecoxib (CELEBREX) 200 MG capsule Take 200 mg by mouth daily.   Yes [provider]  cetirizine (ZYRTEC) 10 MG tablet Take 10 mg by mouth daily as needed for allergies.    Yes [provider]  clopidogrel (PLAVIX) 75 MG tablet Take 75 mg by mouth daily. 09/08/19  Yes [provider]  montelukast (SINGULAIR) 10 MG tablet Take 10 mg by mouth at bedtime. 08/02/19  Yes [provider]  nortriptyline (PAMELOR) 10 MG capsule Take 20 mg by mouth at bedtime. 10/06/19  Yes [provider]  SPIRIVA HANDIHALER 18 MCG inhalation capsule Place 1 capsule into inhaler and inhale daily.  08/05/19  Yes [provider]  calcium  carbonate (OS-CAL) 1250 (500 Ca) MG chewable tablet Chew 500 mg by mouth daily.    [provider]  Coenzyme Q10 (COQ10) 100 MG CAPS Take 100 mg by mouth daily.     [provider]  ergocalciferol (VITAMIN D2) 1.25 MG (50000 UT) capsule Take 50,000 Units by mouth once a week.    [provider]    Inpatient Medications: Scheduled Meds: . aspirin EC  81 mg Oral Daily  . atorvastatin  20 mg Oral  Daily  . enoxaparin (LOVENOX) injection  40 mg Subcutaneous Q24H  . fluticasone furoate-vilanterol  1 puff Inhalation Daily  . loratadine  10 mg Oral Daily  . montelukast  10 mg Oral QHS  . nortriptyline  20 mg Oral QHS  . pantoprazole (PROTONIX) IV  40 mg Intravenous QHS  . sodium chloride flush  3 mL Intravenous Once  . tiotropium  1 capsule Inhalation Daily   Continuous Infusions: . sodium chloride 50 mL/hr at 10/15/19 1046  . ciprofloxacin Stopped (10/15/19 0250)   And  . metronidazole Stopped (10/15/19 1040)   PRN Meds: acetaminophen **OR** acetaminophen, albuterol, HYDROcodone-acetaminophen, morphine injection, ondansetron **OR** ondansetron (ZOFRAN) IV  Allergies:    Allergies  Allergen Reactions  . Amoxicillin Other (See Comments)    Has patient had a PCN reaction causing immediate rash, facial/tongue/throat swelling, SOB or lightheadedness with hypotension: Yes Has patient had a PCN reaction causing severe rash involving mucus membranes or skin necrosis: Yes Has patient had a PCN reaction that required hospitalization:yes Has patient had a PCN reaction occurring within the last 10 years: yes If all of the above answers are "NO", then may proceed with Cephalosporin use.   . Prednisone Other (See Comments)    Full body rash     Social History:   Social History   Socioeconomic History  . Marital status: Divorced    Spouse name: Not on file  . Number of children: Not on file  . Years of education: Not on file  . Highest education level: Not on file  Occupational History  . Not on file  Tobacco Use  . Smoking status: Former Smoker    Packs/day: 0.25    Quit date: 07/17/2019    Years since quitting: 0.2  . Smokeless tobacco: Never Used  Vaping Use  . Vaping Use: Never used  Substance and Sexual Activity  . Alcohol use: No  . Drug use: Yes    Types: Marijuana    Comment: occasionally  . Sexual activity: Not on file  Other Topics Concern  . Not on file    Social History Narrative  . Not on file   Social Determinants of Health   Financial Resource Strain:   . Difficulty of Paying Living Expenses:   Food Insecurity:   . Worried About Charity fundraiser in the Last Year:   . Arboriculturist in the Last Year:   Transportation Needs:   . Film/video editor (Medical):   Marland Kitchen Lack of Transportation (Non-Medical):   Physical Activity:   . Days of Exercise per Week:   . Minutes of Exercise per Session:   Stress:   . Feeling of Stress :   Social Connections:   . Frequency of Communication with Friends and Family:   . Frequency of Social Gatherings with Friends and Family:   . Attends Religious Services:   . Active Member of Clubs or Organizations:   . Attends Archivist Meetings:   .  Marital Status:   Intimate Partner Violence:   . Fear of Current or Ex-Partner:   . Emotionally Abused:   Marland Kitchen Physically Abused:   . Sexually Abused:     Family History:    Family History  Problem Relation Age of Onset  . Breast cancer Sister   . Breast cancer Paternal Grandmother      ROS:  Please see the history of present illness.   All other ROS reviewed and negative.     Physical Exam/Data:   Vitals:   10/15/19 0047 10/15/19 0731 10/15/19 0800 10/15/19 1000  BP:  94/60 97/64 95/63   Pulse:  85 95 85  Resp: 18 18    Temp: 97.9 F (36.6 C)     TempSrc: Oral     SpO2:  100% 98% 97%  Weight:      Height:        Intake/Output Summary (Last 24 hours) at 10/15/2019 1114 Last data filed at 10/15/2019 1040 Gross per 24 hour  Intake 1050 ml  Output --  Net 1050 ml   Last 3 Weights 10/14/2019 08/16/2019 05/07/2017  Weight (lbs) 172 lb 165 lb 131 lb  Weight (kg) 78.019 kg 74.844 kg 59.421 kg     Body mass index is 31.46 kg/m.  General:  Well nourished, well developed, in no acute distress HEENT: normal Lymph: no adenopathy Neck: no JVD Endocrine:  No thryomegaly Vascular: No carotid bruits; FA pulses 2+ bilaterally without  bruits  Cardiac:  normal S1, S2; RRR; no murmur  Lungs:  clear to auscultation bilaterally, no wheezing, rhonchi or rales  Abd: soft, tender to deep palpatino, no hepatomegaly  Ext: no edema Musculoskeletal:  No deformities, BUE and BLE strength normal and equal Skin: warm and dry  Neuro:  CNs 2-12 intact, no focal abnormalities noted Psych:  Normal affect   EKG:  The EKG was personally reviewed and demonstrates: Sinus tachycardia, heart rate 109, otherwise normal Telemetry:  Telemetry was personally reviewed and demonstrates: Sinus tach  Relevant CV Studies: Echo 07/2019 1. Left ventricular ejection fraction, by estimation, is 60 to 65%. The  left ventricle has normal function. The left ventricle has no regional  wall motion abnormalities. Left ventricular diastolic parameters were  normal.  2. Right ventricular systolic function is normal. The right ventricular  size is normal. There is normal pulmonary artery systolic pressure.  3. The mitral valve is normal in structure. No evidence of mitral valve  regurgitation. No evidence of mitral stenosis.  4. The aortic valve was not well visualized. Aortic valve regurgitation  is moderate. Mild aortic valve stenosis.  5. Pulmonic valve regurgitation not assessed.  6. The inferior vena cava is normal in size with greater than 50%  respiratory variability, suggesting right atrial pressure of 3 mmHg.  Laboratory Data:  High Sensitivity Troponin:   Recent Labs  Lab 10/14/19 1402 10/14/19 1816  TROPONINIHS 9 12     Chemistry Recent Labs  Lab 10/14/19 1402 10/15/19 0729  NA 131* 135  K 4.2 3.6  CL 93* 97*  CO2 27 28  GLUCOSE 127* 92  BUN 17 15  CREATININE 0.76 0.78  CALCIUM 9.2 8.5*  GFRNONAA >60 >60  GFRAA >60 >60  ANIONGAP 11 10    Recent Labs  Lab 10/14/19 1402  PROT 9.0*  ALBUMIN 3.8  AST 24  ALT 22  ALKPHOS 97  BILITOT 1.2   Hematology Recent Labs  Lab 10/14/19 1402 10/15/19 0729  WBC 16.7* 11.7*  RBC 4.42 3.73*  HGB 13.2 11.0*  HCT 37.9 32.5*  MCV 85.7 87.1  MCH 29.9 29.5  MCHC 34.8 33.8  RDW 14.7 15.4  PLT 163 153   BNPNo results for input(s): BNP, PROBNP in the last 168 hours.  DDimer No results for input(s): DDIMER in the last 168 hours.   Radiology/Studies:  CT Angio Chest PE W and/or Wo Contrast  Result Date: 10/14/2019 CLINICAL DATA:  Shortness of breath, right lower quadrant pain, nausea for 2 days, COPD EXAM: CT ANGIOGRAPHY CHEST CT ABDOMEN AND PELVIS WITH CONTRAST TECHNIQUE: Multidetector CT imaging of the chest was performed using the standard protocol during bolus administration of intravenous contrast. Multiplanar CT image reconstructions and MIPs were obtained to evaluate the vascular anatomy. Multidetector CT imaging of the abdomen and pelvis was performed using the standard protocol during bolus administration of intravenous contrast. CONTRAST:  159mL OMNIPAQUE IOHEXOL 350 MG/ML SOLN COMPARISON:  05/07/2017 FINDINGS: CTA CHEST FINDINGS Cardiovascular: This is a technically adequate evaluation of the pulmonary vasculature. There are no filling defects or pulmonary emboli. The heart is unremarkable without pericardial effusion. No evidence of thoracic aortic aneurysm or dissection. Mild atherosclerosis of the aorta and coronary vessels. Mediastinum/Nodes: No enlarged mediastinal, hilar, or axillary lymph nodes. Thyroid gland, trachea, and esophagus demonstrate no significant findings. Lungs/Pleura: No acute airspace disease, effusion, or pneumothorax. Scattered atelectasis within the right middle lobe and lingula. Upper lobe predominant emphysema unchanged. Central airways are patent. Musculoskeletal: No acute or destructive bony lesions. Reconstructed images demonstrate no additional findings. Review of the MIP images confirms the above findings. CT ABDOMEN and PELVIS FINDINGS Hepatobiliary: No focal liver abnormality is seen. No gallstones, gallbladder wall thickening, or  biliary dilatation. Pancreas: Unremarkable. No pancreatic ductal dilatation or surrounding inflammatory changes. Spleen: Normal in size without focal abnormality. Adrenals/Urinary Tract: There is a stable 1.1 cm indeterminate hypodensity upper pole left kidney, mean attenuation 21 Hounsfield units, likely a small cyst. Otherwise the kidneys enhance normally and symmetrically. No urinary tract calculi or obstructive uropathy. The bladder is unremarkable. The adrenals are normal. Stomach/Bowel: A dilated inflamed appendix is seen within the right lower quadrant, measuring up to 11 mm in diameter. There is appendiceal wall thickening with periappendiceal fat stranding, consistent with acute uncomplicated appendicitis. No perforation, fluid collection, or abscess. No bowel obstruction or ileus. Vascular/Lymphatic: Aortic atherosclerosis. No enlarged abdominal or pelvic lymph nodes. Reproductive: Uterus and bilateral adnexa are unremarkable. Other: No free fluid or free gas.  No abdominal wall hernia. Musculoskeletal: No acute or destructive bony lesions. Reconstructed images demonstrate no additional findings. Review of the MIP images confirms the above findings. IMPRESSION: 1. Acute uncomplicated appendicitis. No perforation, fluid collection, or abscess. 2. No evidence of pulmonary embolus. 3. Stable 1.1 cm indeterminate hypodensity upper pole left kidney, likely a small cyst. 4. Aortic Atherosclerosis (ICD10-I70.0) and Emphysema (ICD10-J43.9). Electronically Signed   By: Randa Ngo M.D.   On: 10/14/2019 19:12   CT ABDOMEN PELVIS W CONTRAST  Result Date: 10/14/2019 CLINICAL DATA:  Shortness of breath, right lower quadrant pain, nausea for 2 days, COPD EXAM: CT ANGIOGRAPHY CHEST CT ABDOMEN AND PELVIS WITH CONTRAST TECHNIQUE: Multidetector CT imaging of the chest was performed using the standard protocol during bolus administration of intravenous contrast. Multiplanar CT image reconstructions and MIPs were  obtained to evaluate the vascular anatomy. Multidetector CT imaging of the abdomen and pelvis was performed using the standard protocol during bolus administration of intravenous contrast. CONTRAST:  120mL OMNIPAQUE IOHEXOL 350 MG/ML SOLN COMPARISON:  05/07/2017 FINDINGS: CTA CHEST FINDINGS Cardiovascular: This is a technically adequate evaluation of the pulmonary vasculature. There are no filling defects or pulmonary emboli. The heart is unremarkable without pericardial effusion. No evidence of thoracic aortic aneurysm or dissection. Mild atherosclerosis of the aorta and coronary vessels. Mediastinum/Nodes: No enlarged mediastinal, hilar, or axillary lymph nodes. Thyroid gland, trachea, and esophagus demonstrate no significant findings. Lungs/Pleura: No acute airspace disease, effusion, or pneumothorax. Scattered atelectasis within the right middle lobe and lingula. Upper lobe predominant emphysema unchanged. Central airways are patent. Musculoskeletal: No acute or destructive bony lesions. Reconstructed images demonstrate no additional findings. Review of the MIP images confirms the above findings. CT ABDOMEN and PELVIS FINDINGS Hepatobiliary: No focal liver abnormality is seen. No gallstones, gallbladder wall thickening, or biliary dilatation. Pancreas: Unremarkable. No pancreatic ductal dilatation or surrounding inflammatory changes. Spleen: Normal in size without focal abnormality. Adrenals/Urinary Tract: There is a stable 1.1 cm indeterminate hypodensity upper pole left kidney, mean attenuation 21 Hounsfield units, likely a small cyst. Otherwise the kidneys enhance normally and symmetrically. No urinary tract calculi or obstructive uropathy. The bladder is unremarkable. The adrenals are normal. Stomach/Bowel: A dilated inflamed appendix is seen within the right lower quadrant, measuring up to 11 mm in diameter. There is appendiceal wall thickening with periappendiceal fat stranding, consistent with acute  uncomplicated appendicitis. No perforation, fluid collection, or abscess. No bowel obstruction or ileus. Vascular/Lymphatic: Aortic atherosclerosis. No enlarged abdominal or pelvic lymph nodes. Reproductive: Uterus and bilateral adnexa are unremarkable. Other: No free fluid or free gas.  No abdominal wall hernia. Musculoskeletal: No acute or destructive bony lesions. Reconstructed images demonstrate no additional findings. Review of the MIP images confirms the above findings. IMPRESSION: 1. Acute uncomplicated appendicitis. No perforation, fluid collection, or abscess. 2. No evidence of pulmonary embolus. 3. Stable 1.1 cm indeterminate hypodensity upper pole left kidney, likely a small cyst. 4. Aortic Atherosclerosis (ICD10-I70.0) and Emphysema (ICD10-J43.9). Electronically Signed   By: Randa Ngo M.D.   On: 10/14/2019 19:12         Assessment and Plan:   1. Pre op risk stratification -Patient with no current cardiac symptoms. -Chronic shortness of breath secondary to emphysema -Last echocardiogram showed normal systolic and diastolic function. -Blood pressure well controlled. -No indication for additional cardiac testing at this point. -Patient can undergo procedure if deemed necessary per surgical team without any further cardiac testing or intervention. -Patient has no objective evidence of CAD or CHF.  Not sure where this information originated or why it was mentioned in the chart.  2. Hx of htn -BP is low normal -Stop amlodipine for now in light of appendicitis. -Restart BP meds if blood pressure becomes elevated.  3.  History of CVA -Continue aspirin Plavix statin  4.  Aortic regurgitation -Moderate AI on last echo -Chamber sizes normal, EF normal -Continue to monitor on follow-up as patient  5.  Appendicitis -On antibiotics -Management as per primary team   Signed, Kate Sable, MD  10/15/2019 11:14 AM

## 2019-10-15 NOTE — Consult Note (Signed)
Reason for Consult:Pre-surgical evaluation Requesting Physician: Cintron-Diaz  CC: Abdominal pain  I have been asked by Dr. Windell Moment to see this patient in consultation for pre-surgical evaluation.  HPI: Stephanie Ross is an 54 y.o. female with a history of asthma, COPD, HTN and stroke in May of this year who presents with appendicitis.  Neurologically patient with minimal deficits and noted to have punctate brain stem infarcts in May.  Work up was otherwise unremarkable.  Patient discharged on ASA and Plavix.  Currently on ASA and statin.  Has had no recurrent neurological events.      Past Medical History:  Diagnosis Date  . Asthma   . Cancer ([place])   . COPD (chronic obstructive pulmonary disease) ([place])   . Hypertension     Past Surgical History:  Procedure Laterality Date  . BILATERAL CARPAL TUNNEL RELEASE    . CERVICAL ABLATION    . ROTATOR CUFF REPAIR Right     Family History  Problem Relation Age of Onset  . Breast cancer Sister   . Breast cancer Paternal Grandmother     Social History:  reports that she quit smoking about 2 months ago. She smoked 0.25 packs per day. She has never used smokeless tobacco. She reports current drug use. Drug: Marijuana. She reports that she does not drink alcohol.  Allergies  Allergen Reactions  . Amoxicillin Other (See Comments)    Has patient had a PCN reaction causing immediate rash, facial/tongue/throat swelling, SOB or lightheadedness with hypotension: Yes Has patient had a PCN reaction causing severe rash involving mucus membranes or skin necrosis: Yes Has patient had a PCN reaction that required hospitalization:yes Has patient had a PCN reaction occurring within the last 10 years: yes If all of the above answers are "NO", then may proceed with Cephalosporin use.   . Prednisone Other (See Comments)    Full body rash     Medications: I have reviewed the patient's current medications. Prior to Admission medications    Medication Sig Start Date End Date Taking? Authorizing Provider  albuterol (PROVENTIL HFA;VENTOLIN HFA) 108 (90 Base) MCG/ACT inhaler Inhale 2 puffs into the lungs every 4 (four) hours as needed for wheezing or shortness of breath. 05/07/17  Yes Noemi Chapel, MD  amLODipine (NORVASC) 5 MG tablet Take 5 mg by mouth at bedtime.    Yes [provider]  aspirin EC 81 MG tablet Take 1 tablet (81 mg total) by mouth daily. 08/18/19 08/17/20 Yes Nicole Kindred A, DO  atorvastatin (LIPITOR) 20 MG tablet Take 20 mg by mouth daily.   Yes [provider]  budesonide-formoterol (SYMBICORT) 160-4.5 MCG/ACT inhaler Inhale 2 puffs into the lungs 2 (two) times daily.   Yes [provider]  celecoxib (CELEBREX) 200 MG capsule Take 200 mg by mouth daily.   Yes [provider]  cetirizine (ZYRTEC) 10 MG tablet Take 10 mg by mouth daily as needed for allergies.    Yes [provider]  clopidogrel (PLAVIX) 75 MG tablet Take 75 mg by mouth daily. 09/08/19  Yes [provider]  montelukast (SINGULAIR) 10 MG tablet Take 10 mg by mouth at bedtime. 08/02/19  Yes [provider]  nortriptyline (PAMELOR) 10 MG capsule Take 20 mg by mouth at bedtime. 10/06/19  Yes [provider]  SPIRIVA HANDIHALER 18 MCG inhalation capsule Place 1 capsule into inhaler and inhale daily.  08/05/19  Yes [provider]  calcium carbonate (OS-CAL) 1250 (500 Ca) MG chewable tablet Chew 500 mg  by mouth daily.    [provider]  Coenzyme Q10 (COQ10) 100 MG CAPS Take 100 mg by mouth daily.     [provider]  ergocalciferol (VITAMIN D2) 1.25 MG (50000 UT) capsule Take 50,000 Units by mouth once a week.    [provider]    ROS: History obtained from the patient  General ROS: negative for - chills, fatigue, fever, night sweats, weight gain or weight loss Psychological ROS: negative for - behavioral disorder, hallucinations, memory  difficulties, mood swings or suicidal ideation Ophthalmic ROS: negative for - blurry vision, double vision, eye pain or loss of vision ENT ROS: negative for - epistaxis, nasal discharge, oral lesions, sore throat, tinnitus or vertigo Allergy and Immunology ROS: negative for - hives or itchy/watery eyes Hematological and Lymphatic ROS: negative for - bleeding problems, bruising or swollen lymph nodes Endocrine ROS: negative for - galactorrhea, hair pattern changes, polydipsia/polyuria or temperature intolerance Respiratory ROS: negative for - cough, hemoptysis, shortness of breath or wheezing Cardiovascular ROS: negative for - chest pain, dyspnea on exertion, edema or irregular heartbeat Gastrointestinal ROS: abdominal pain Genito-Urinary ROS: negative for - dysuria, hematuria, incontinence or urinary frequency/urgency Musculoskeletal ROS: negative for - joint swelling or muscular weakness Neurological ROS: as noted in HPI Dermatological ROS: negative for rash and skin lesion changes  Physical Examination: Blood pressure (!) 102/59, pulse 85, temperature 97.9 F (36.6 C), temperature source Oral, resp. rate 18, height 5\' 2"  (1.575 m), weight 78 kg, SpO2 96 %.  HEENT-  Normocephalic, no lesions, without obvious abnormality.  Normal external eye and conjunctiva.  Normal TM's bilaterally.  Normal auditory canals and external ears. Normal external nose, mucus membranes and septum.  Normal pharynx. Cardiovascular- S1, S2 normal, pulses palpable throughout   Lungs- chest clear, no wheezing, rales, normal symmetric air entry Abdomen- soft, non-tender; bowel sounds normal; no masses,  no organomegaly Extremities- no edema Lymph-no adenopathy palpable Musculoskeletal-no joint tenderness, deformity or swelling Skin-warm and dry, no hyperpigmentation, vitiligo, or suspicious lesions  Neurological Examination   Mental Status: Alert, oriented, thought content appropriate.  Speech fluent without  evidence of aphasia.  Able to follow 3 step commands without difficulty. Cranial Nerves: II: Discs flat bilaterally; Visual fields grossly normal, pupils equal, round, reactive to light and accommodation III,IV, VI: ptosis not present, extra-ocular motions intact bilaterally V,VII: decreased right NLF, facial light touch sensation decreased on the left VIII: hearing normal bilaterally IX,X: gag reflex present XI: bilateral shoulder shrug XII: midline tongue extension Motor: Right : Upper extremity   5/5    Left:     Upper extremity   5/5  Lower extremity   5/5     Lower extremity   5/5 Tone and bulk:normal tone throughout; no atrophy noted Sensory: Pinprick and light touch intact throughout, bilaterally Deep Tendon Reflexes: Symmetric throughout Plantars: Right: downgoing   Left: downgoing Cerebellar: Normal finger-to-nose and normal heel-to-shin testing bilaterally    Laboratory Studies:   Basic Metabolic Panel: Recent Labs  Lab 10/14/19 1402 10/15/19 0729  NA 131* 135  K 4.2 3.6  CL 93* 97*  CO2 27 28  GLUCOSE 127* 92  BUN 17 15  CREATININE 0.76 0.78  CALCIUM 9.2 8.5*    Liver Function Tests: Recent Labs  Lab 10/14/19 1402  AST 24  ALT 22  ALKPHOS 97  BILITOT 1.2  PROT 9.0*  ALBUMIN 3.8   Recent Labs  Lab 10/14/19 1402  LIPASE 25   No results for input(s): AMMONIA in  the last 168 hours.  CBC: Recent Labs  Lab 10/14/19 1402 10/15/19 0729  WBC 16.7* 11.7*  HGB 13.2 11.0*  HCT 37.9 32.5*  MCV 85.7 87.1  PLT 163 153    Cardiac Enzymes: No results for input(s): CKTOTAL, CKMB, CKMBINDEX, TROPONINI in the last 168 hours.  BNP: Invalid input(s): POCBNP  CBG: No results for input(s): GLUCAP in the last 168 hours.  Microbiology: Results for orders placed or performed during the hospital encounter of 10/14/19  SARS Coronavirus 2 by RT PCR (hospital order, performed in [facility] hospital lab) Nasopharyngeal Nasopharyngeal Swab     Status: None    Collection Time: 10/14/19  6:16 PM   Specimen: Nasopharyngeal Swab  Result Value Ref Range Status   SARS Coronavirus 2 NEGATIVE NEGATIVE Final    Comment: (NOTE) SARS-CoV-2 target nucleic acids are NOT DETECTED.  The SARS-CoV-2 RNA is generally detectable in upper and lower respiratory specimens during the acute phase of infection. The lowest concentration of SARS-CoV-2 viral copies this assay can detect is 250 copies / mL. A negative result does not preclude SARS-CoV-2 infection and should not be used as the sole basis for treatment or other patient management decisions.  A negative result may occur with improper specimen collection / handling, submission of specimen other than nasopharyngeal swab, presence of viral mutation(s) within the areas targeted by this assay, and inadequate number of viral copies (<250 copies / mL). A negative result must be combined with clinical observations, patient history, and epidemiological information.  Fact Sheet for Patients:   StrictlyIdeas.no  Fact Sheet for Healthcare Providers: BankingDealers.co.za  This test is not yet approved or  cleared by the [place] FDA and has been authorized for detection and/or diagnosis of SARS-CoV-2 by FDA under an Emergency Use Authorization (EUA).  This EUA will remain in effect (meaning this test can be used) for the duration of the COVID-19 declaration under Section 564(b)(1) of the Act, 21 U.S.C. section 360bbb-3(b)(1), unless the authorization is terminated or revoked sooner.  Performed at [facility], [address] [place], [place] [address]   Culture, blood (Routine X 2) w Reflex to ID Panel     Status: None (Preliminary result)   Collection Time: 10/14/19  6:16 PM   Specimen: BLOOD  Result Value Ref Range Status   Specimen Description BLOOD 2NS SET  Final   Special Requests   Final    BOTTLES DRAWN AEROBIC AND ANAEROBIC Blood Culture  adequate volume   Culture   Final    NO GROWTH < 12 HOURS Performed at [facility], [address] [place], [place] [address]    Report Status PENDING  Incomplete  Blood culture (routine x 2)     Status: None (Preliminary result)   Collection Time: 10/14/19  8:02 PM   Specimen: BLOOD  Result Value Ref Range Status   Specimen Description BLOOD BLOOD LEFT FOREARM  Final   Special Requests   Final    BOTTLES DRAWN AEROBIC AND ANAEROBIC Blood Culture results may not be optimal due to an inadequate volume of blood received in culture bottles   Culture   Final    NO GROWTH < 12 HOURS Performed at [facility], [address] [place], [place] [address]    Report Status PENDING  Incomplete    Coagulation Studies: No results for input(s): LABPROT, INR in the last 72 hours.  Urinalysis:  Recent Labs  Lab 10/14/19 1402  COLORURINE YELLOW*  LABSPEC 1.018  PHURINE 6.0  GLUCOSEU NEGATIVE  HGBUR NEGATIVE  BILIRUBINUR NEGATIVE  KETONESUR NEGATIVE  PROTEINUR NEGATIVE  NITRITE NEGATIVE  LEUKOCYTESUR NEGATIVE    Lipid Panel:     Component Value Date/Time   CHOL 129 08/17/2019 0526   TRIG 56 08/17/2019 0526   HDL 62 08/17/2019 0526   CHOLHDL 2.1 08/17/2019 0526   VLDL 11 08/17/2019 0526   LDLCALC 56 08/17/2019 0526    HgbA1C:  Lab Results  Component Value Date   HGBA1C 5.6 08/17/2019    Urine Drug Screen:      Component Value Date/Time   LABOPIA NONE DETECTED 08/17/2019 0135   COCAINSCRNUR NONE DETECTED 08/17/2019 0135   LABBENZ NONE DETECTED 08/17/2019 0135   AMPHETMU NONE DETECTED 08/17/2019 0135   THCU POSITIVE (A) 08/17/2019 0135   LABBARB NONE DETECTED 08/17/2019 0135    Alcohol Level: No results for input(s): ETH in the last 168 hours.  Other results: EKG: sinus tachycardia at 109 bpm.  Imaging: CT Angio Chest PE W and/or Wo Contrast  Result Date: 10/14/2019 CLINICAL DATA:  Shortness of breath, right lower quadrant pain, nausea for 2  days, COPD EXAM: CT ANGIOGRAPHY CHEST CT ABDOMEN AND PELVIS WITH CONTRAST TECHNIQUE: Multidetector CT imaging of the chest was performed using the standard protocol during bolus administration of intravenous contrast. Multiplanar CT image reconstructions and MIPs were obtained to evaluate the vascular anatomy. Multidetector CT imaging of the abdomen and pelvis was performed using the standard protocol during bolus administration of intravenous contrast. CONTRAST:  18mL OMNIPAQUE IOHEXOL 350 MG/ML SOLN COMPARISON:  05/07/2017 FINDINGS: CTA CHEST FINDINGS Cardiovascular: This is a technically adequate evaluation of the pulmonary vasculature. There are no filling defects or pulmonary emboli. The heart is unremarkable without pericardial effusion. No evidence of thoracic aortic aneurysm or dissection. Mild atherosclerosis of the aorta and coronary vessels. Mediastinum/Nodes: No enlarged mediastinal, hilar, or axillary lymph nodes. Thyroid gland, trachea, and esophagus demonstrate no significant findings. Lungs/Pleura: No acute airspace disease, effusion, or pneumothorax. Scattered atelectasis within the right middle lobe and lingula. Upper lobe predominant emphysema unchanged. Central airways are patent. Musculoskeletal: No acute or destructive bony lesions. Reconstructed images demonstrate no additional findings. Review of the MIP images confirms the above findings. CT ABDOMEN and PELVIS FINDINGS Hepatobiliary: No focal liver abnormality is seen. No gallstones, gallbladder wall thickening, or biliary dilatation. Pancreas: Unremarkable. No pancreatic ductal dilatation or surrounding inflammatory changes. Spleen: Normal in size without focal abnormality. Adrenals/Urinary Tract: There is a stable 1.1 cm indeterminate hypodensity upper pole left kidney, mean attenuation 21 Hounsfield units, likely a small cyst. Otherwise the kidneys enhance normally and symmetrically. No urinary tract calculi or obstructive uropathy. The  bladder is unremarkable. The adrenals are normal. Stomach/Bowel: A dilated inflamed appendix is seen within the right lower quadrant, measuring up to 11 mm in diameter. There is appendiceal wall thickening with periappendiceal fat stranding, consistent with acute uncomplicated appendicitis. No perforation, fluid collection, or abscess. No bowel obstruction or ileus. Vascular/Lymphatic: Aortic atherosclerosis. No enlarged abdominal or pelvic lymph nodes. Reproductive: Uterus and bilateral adnexa are unremarkable. Other: No free fluid or free gas.  No abdominal wall hernia. Musculoskeletal: No acute or destructive bony lesions. Reconstructed images demonstrate no additional findings. Review of the MIP images confirms the above findings. IMPRESSION: 1. Acute uncomplicated appendicitis. No perforation, fluid collection, or abscess. 2. No evidence of pulmonary embolus. 3. Stable 1.1 cm indeterminate hypodensity upper pole left kidney, likely a small cyst. 4. Aortic Atherosclerosis (ICD10-I70.0) and Emphysema (ICD10-J43.9). Electronically Signed  By: Randa Ngo M.D.   On: 10/14/2019 19:12   CT ABDOMEN PELVIS W CONTRAST  Result Date: 10/14/2019 CLINICAL DATA:  Shortness of breath, right lower quadrant pain, nausea for 2 days, COPD EXAM: CT ANGIOGRAPHY CHEST CT ABDOMEN AND PELVIS WITH CONTRAST TECHNIQUE: Multidetector CT imaging of the chest was performed using the standard protocol during bolus administration of intravenous contrast. Multiplanar CT image reconstructions and MIPs were obtained to evaluate the vascular anatomy. Multidetector CT imaging of the abdomen and pelvis was performed using the standard protocol during bolus administration of intravenous contrast. CONTRAST:  165mL OMNIPAQUE IOHEXOL 350 MG/ML SOLN COMPARISON:  05/07/2017 FINDINGS: CTA CHEST FINDINGS Cardiovascular: This is a technically adequate evaluation of the pulmonary vasculature. There are no filling defects or pulmonary emboli. The  heart is unremarkable without pericardial effusion. No evidence of thoracic aortic aneurysm or dissection. Mild atherosclerosis of the aorta and coronary vessels. Mediastinum/Nodes: No enlarged mediastinal, hilar, or axillary lymph nodes. Thyroid gland, trachea, and esophagus demonstrate no significant findings. Lungs/Pleura: No acute airspace disease, effusion, or pneumothorax. Scattered atelectasis within the right middle lobe and lingula. Upper lobe predominant emphysema unchanged. Central airways are patent. Musculoskeletal: No acute or destructive bony lesions. Reconstructed images demonstrate no additional findings. Review of the MIP images confirms the above findings. CT ABDOMEN and PELVIS FINDINGS Hepatobiliary: No focal liver abnormality is seen. No gallstones, gallbladder wall thickening, or biliary dilatation. Pancreas: Unremarkable. No pancreatic ductal dilatation or surrounding inflammatory changes. Spleen: Normal in size without focal abnormality. Adrenals/Urinary Tract: There is a stable 1.1 cm indeterminate hypodensity upper pole left kidney, mean attenuation 21 Hounsfield units, likely a small cyst. Otherwise the kidneys enhance normally and symmetrically. No urinary tract calculi or obstructive uropathy. The bladder is unremarkable. The adrenals are normal. Stomach/Bowel: A dilated inflamed appendix is seen within the right lower quadrant, measuring up to 11 mm in diameter. There is appendiceal wall thickening with periappendiceal fat stranding, consistent with acute uncomplicated appendicitis. No perforation, fluid collection, or abscess. No bowel obstruction or ileus. Vascular/Lymphatic: Aortic atherosclerosis. No enlarged abdominal or pelvic lymph nodes. Reproductive: Uterus and bilateral adnexa are unremarkable. Other: No free fluid or free gas.  No abdominal wall hernia. Musculoskeletal: No acute or destructive bony lesions. Reconstructed images demonstrate no additional findings. Review of  the MIP images confirms the above findings. IMPRESSION: 1. Acute uncomplicated appendicitis. No perforation, fluid collection, or abscess. 2. No evidence of pulmonary embolus. 3. Stable 1.1 cm indeterminate hypodensity upper pole left kidney, likely a small cyst. 4. Aortic Atherosclerosis (ICD10-I70.0) and Emphysema (ICD10-J43.9). Electronically Signed   By: Randa Ngo M.D.   On: 10/14/2019 19:12     Assessment/Plan: 54 y.o. female with a history of asthma, COPD, HTN and stroke in May of this year who presents with apendicits.  Neurologically patient with minimal deficits and noted to have punctate brain stem infarcts in May.  Work up was otherwise unremarkable.  Patient discharged on ASA and Plavix.  Currently on ASA and statin.  Has had no recurrent neurological events.     It was explained to patient that she has an increased surgical risk due to not being more than 3 months from her infarcts.  Although risk increased, this would not preclude her from having surgery if needed to prevent appendix rupture.  Hypotension should be avoided.     Alexis Goodell, MD Neurology [phone] 10/15/2019, 1:15 PM

## 2019-10-15 NOTE — Progress Notes (Signed)
[place] Hospital Day(s): 1.   Post op day(s):  Marland Kitchen   Interval History: Patient seen and examined, no acute events or new complaints overnight. Patient reports reports feeling better today.  She denies any abdominal pain at this moment.  She has been able to void spontaneously.  She is passing gas.  No pain radiation.  No alleviating or aggravating factor.  Vital signs in last 24 hours: [min-max] current  Temp:  [97.9 F (36.6 C)-98.7 F (37.1 C)] 97.9 F (36.6 C) (07/21 0047) Pulse Rate:  [85-122] 85 (07/21 1210) Resp:  [18-22] 18 (07/21 1210) BP: (94-104)/(59-70) 102/59 (07/21 1210) SpO2:  [90 %-100 %] 96 % (07/21 1210) Weight:  [78 kg] 78 kg (07/20 1401)     Height: 5\' 2"  (157.5 cm) Weight: 78 kg BMI (Calculated): 31.45   Physical Exam:  Constitutional: alert, cooperative and no distress  Respiratory: breathing non-labored at rest  Cardiovascular: regular rate and sinus rhythm  Gastrointestinal: soft, non-tender, and non-distended  Labs:  CBC Latest Ref Rng & Units 10/15/2019 10/14/2019 08/16/2019  WBC 4.0 - 10.5 K/uL 11.7(H) 16.7(H) 7.7  Hemoglobin 12.0 - 15.0 g/dL 11.0(L) 13.2 13.0  Hematocrit 36 - 46 % 32.5(L) 37.9 36.0  Platelets 150 - 400 K/uL 153 163 94(L)   CMP Latest Ref Rng & Units 10/15/2019 10/14/2019 08/18/2019  Glucose 70 - 99 mg/dL 92 127(H) -  BUN 6 - 20 mg/dL 15 17 -  Creatinine 0.44 - 1.00 mg/dL 0.78 0.76 -  Sodium 135 - 145 mmol/L 135 131(L) 128(L)  Potassium 3.5 - 5.1 mmol/L 3.6 4.2 -  Chloride 98 - 111 mmol/L 97(L) 93(L) -  CO2 22 - 32 mmol/L 28 27 -  Calcium 8.9 - 10.3 mg/dL 8.5(L) 9.2 -  Total Protein 6.5 - 8.1 g/dL - 9.0(H) -  Total Bilirubin 0.3 - 1.2 mg/dL - 1.2 -  Alkaline Phos 38 - 126 U/L - 97 -  AST 15 - 41 U/L - 24 -  ALT 0 - 44 U/L - 22 -    Imaging studies: No new pertinent imaging studies   Assessment/Plan:  54 y.o. female with acute appendicitis, complicated by pertinent comorbidities including recent stroke, CHF,  COPD, coronary disease.  Patient currently responding adequately to IV antibiotic therapy.  The pain has almost completely resolved.  The patient had no nausea or vomiting.  The white blood cells decreased to 11,000 from 16,000.  There has been no fever.  I will start patient on clear liquid diet.  Appreciate input and recommendations from cardiology, pulmonology and neurology.  We will continue to follow closely with you labs and physical exam and vital signs.  We will continue current IV antibiotic therapy.   Arnold Long, MD

## 2019-10-16 MED ORDER — METHYLPREDNISOLONE SODIUM SUCC 40 MG IJ SOLR
40.0000 mg | Freq: Two times a day (BID) | INTRAMUSCULAR | Status: DC
  Administered 2019-10-16 – 2019-10-18 (×4): 40 mg via INTRAVENOUS
  Filled 2019-10-16 (×4): qty 1

## 2019-10-16 MED ORDER — HYOSCYAMINE SULFATE 0.125 MG SL SUBL
0.2500 mg | SUBLINGUAL_TABLET | Freq: Once | SUBLINGUAL | Status: AC
  Administered 2019-10-16: 0.25 mg via SUBLINGUAL
  Filled 2019-10-16: qty 2

## 2019-10-16 MED ORDER — TIOTROPIUM BROMIDE MONOHYDRATE 18 MCG IN CAPS
18.0000 ug | ORAL_CAPSULE | Freq: Every day | RESPIRATORY_TRACT | Status: DC
  Administered 2019-10-16 – 2019-10-18 (×2): 18 ug via RESPIRATORY_TRACT
  Filled 2019-10-16: qty 5

## 2019-10-16 NOTE — Progress Notes (Signed)
Pulmonary Medicine          Date: 10/16/2019,   MRN# [id number] Stephanie Ross 07/01/1965     AdmissionWeight: 78 kg                 CurrentWeight: 78 kg  Referring Physician: Dr Windell Moment    CHIEF COMPLAINT:   Abdominal pain   SUBJECTIVE   Patient is still having expiratory monophonic wheezing consistent with AECOPD.   She complains of reflux symptoms and requests something for heart burn.    PAST MEDICAL HISTORY   Past Medical History:  Diagnosis Date  . Asthma   . Cancer ([place])   . COPD (chronic obstructive pulmonary disease) ([place])   . Hypertension      SURGICAL HISTORY   Past Surgical History:  Procedure Laterality Date  . BILATERAL CARPAL TUNNEL RELEASE    . CERVICAL ABLATION    . ROTATOR CUFF REPAIR Right      FAMILY HISTORY   Family History  Problem Relation Age of Onset  . Breast cancer Sister   . Breast cancer Paternal Grandmother      SOCIAL HISTORY   Social History   Tobacco Use  . Smoking status: Former Smoker    Packs/day: 0.25    Quit date: 07/17/2019    Years since quitting: 0.2  . Smokeless tobacco: Never Used  Vaping Use  . Vaping Use: Never used  Substance Use Topics  . Alcohol use: No  . Drug use: Yes    Types: Marijuana    Comment: occasionally     MEDICATIONS    Home Medication:    Current Medication:  Current Facility-Administered Medications:  .  acetaminophen (TYLENOL) tablet 650 mg, 650 mg, Oral, Q6H PRN **OR** acetaminophen (TYLENOL) suppository 650 mg, 650 mg, Rectal, Q6H PRN, [name], MD .  aspirin EC tablet 81 mg, 81 mg, Oral, Daily, Cintron-Diaz, Reeves Forth, MD, 81 mg at 10/16/19 1054 .  atorvastatin (LIPITOR) tablet 20 mg, 20 mg, Oral, Daily, Cintron-Diaz, [name], MD, 20 mg at 10/16/19 1055 .  ciprofloxacin (CIPRO) IVPB 400 mg, 400 mg, Intravenous, Q12H, Last Rate: 200 mL/hr at 10/16/19 1247, Rate Verify at 10/16/19 1247 **AND** metroNIDAZOLE (FLAGYL) IVPB 500 mg, 500 mg,  Intravenous, Q8H, Cintron-Diaz, [name], MD, Last Rate: 100 mL/hr at 10/16/19 1435, 500 mg at 10/16/19 1435 .  enoxaparin (LOVENOX) injection 40 mg, 40 mg, Subcutaneous, Q24H, Cintron-Diaz, [name], MD, 40 mg at 10/16/19 1056 .  fluticasone furoate-vilanterol (BREO ELLIPTA) 200-25 MCG/INH 1 puff, 1 puff, Inhalation, Daily, [name], MD, 1 puff at 10/16/19 1055 .  HYDROcodone-acetaminophen (NORCO/VICODIN) 5-325 MG per tablet 1-2 tablet, 1-2 tablet, Oral, Q4H PRN, [name], MD, 2 tablet at 10/16/19 1717 .  ipratropium-albuterol (DUONEB) 0.5-2.5 (3) MG/3ML nebulizer solution 3 mL, 3 mL, Nebulization, Q6H, Zac Torti, MD, 3 mL at 10/16/19 1349 .  loratadine (CLARITIN) tablet 10 mg, 10 mg, Oral, Daily, [name], MD, 10 mg at 10/16/19 1055 .  methylPREDNISolone sodium succinate (SOLU-MEDROL) 40 mg/mL injection 40 mg, 40 mg, Intravenous, Q12H, Lloyde Ludlam, MD .  montelukast (SINGULAIR) tablet 10 mg, 10 mg, Oral, QHS, [name], MD, 10 mg at 10/15/19 2108 .  morphine 4 MG/ML injection 4 mg, 4 mg, Intravenous, Q4H PRN, [name], MD .  nortriptyline (PAMELOR) capsule 20 mg, 20 mg, Oral, QHS, Cintron-Diaz, [name], MD, 20 mg at 10/15/19 2108 .  ondansetron (ZOFRAN-ODT) disintegrating tablet 4 mg, 4 mg, Oral, Q6H PRN **OR** ondansetron (ZOFRAN) injection 4 mg,  4 mg, Intravenous, Q6H PRN, [name], MD .  pantoprazole (PROTONIX) injection 40 mg, 40 mg, Intravenous, QHS, [name], MD, 40 mg at 10/15/19 2107 .  sodium chloride flush (NS) 0.9 % injection 3 mL, 3 mL, Intravenous, Once, Cintron-Diaz, [name], MD    ALLERGIES   Amoxicillin and Prednisone     REVIEW OF SYSTEMS    Review of Systems:  Gen:  Denies  fever, sweats, chills weigh loss  HEENT: Denies blurred vision, double vision, ear pain, eye pain, hearing loss, nose bleeds, sore throat Cardiac:  No dizziness, chest pain or heaviness, chest  tightness,edema Resp:   Denies cough or sputum porduction, shortness of breath,wheezing, hemoptysis,  Gi: Denies swallowing difficulty, stomach pain, nausea or vomiting, diarrhea, constipation, bowel incontinence Gu:  Denies bladder incontinence, burning urine Ext:   Denies Joint pain, stiffness or swelling Skin: Denies  skin rash, easy bruising or bleeding or hives Endoc:  Denies polyuria, polydipsia , polyphagia or weight change Psych:   Denies depression, insomnia or hallucinations   Other:  All other systems negative   VS: BP 109/66 (BP Location: Left Arm)   Pulse 82   Temp 97.8 F (36.6 C) (Oral)   Resp 16   Ht 5\' 2"  (1.575 m)   Wt 78 kg   SpO2 98%   BMI 31.46 kg/m      PHYSICAL EXAM    GENERAL:NAD, no fevers, chills, no weakness no fatigue HEAD: Normocephalic, atraumatic.  EYES: Pupils equal, round, reactive to light. Extraocular muscles intact. No scleral icterus.  MOUTH: Moist mucosal membrane. Dentition intact. No abscess noted.  EAR, NOSE, THROAT: Clear without exudates. No external lesions.  NECK: Supple. No thyromegaly. No nodules. No JVD.  PULMONARY: bilateral wheezing worse in expiratory phase CARDIOVASCULAR: S1 and S2. Regular rate and rhythm. No murmurs, rubs, or gallops. No edema. Pedal pulses 2+ bilaterally.  GASTROINTESTINAL: Soft, nontender, nondistended. No masses. Positive bowel sounds. No hepatosplenomegaly.  MUSCULOSKELETAL: No swelling, clubbing, or edema. Range of motion full in all extremities.  NEUROLOGIC: Cranial nerves II through XII are intact. No gross focal neurological deficits. Sensation intact. Reflexes intact.  SKIN: No ulceration, lesions, rashes, or cyanosis. Skin warm and dry. Turgor intact.  PSYCHIATRIC: Mood, affect within normal limits. The patient is awake, alert and oriented x 3. Insight, judgment intact.       IMAGING    CT Angio Chest PE W and/or Wo Contrast  Result Date: 10/14/2019 CLINICAL DATA:  Shortness of breath,  right lower quadrant pain, nausea for 2 days, COPD EXAM: CT ANGIOGRAPHY CHEST CT ABDOMEN AND PELVIS WITH CONTRAST TECHNIQUE: Multidetector CT imaging of the chest was performed using the standard protocol during bolus administration of intravenous contrast. Multiplanar CT image reconstructions and MIPs were obtained to evaluate the vascular anatomy. Multidetector CT imaging of the abdomen and pelvis was performed using the standard protocol during bolus administration of intravenous contrast. CONTRAST:  160mL OMNIPAQUE IOHEXOL 350 MG/ML SOLN COMPARISON:  05/07/2017 FINDINGS: CTA CHEST FINDINGS Cardiovascular: This is a technically adequate evaluation of the pulmonary vasculature. There are no filling defects or pulmonary emboli. The heart is unremarkable without pericardial effusion. No evidence of thoracic aortic aneurysm or dissection. Mild atherosclerosis of the aorta and coronary vessels. Mediastinum/Nodes: No enlarged mediastinal, hilar, or axillary lymph nodes. Thyroid gland, trachea, and esophagus demonstrate no significant findings. Lungs/Pleura: No acute airspace disease, effusion, or pneumothorax. Scattered atelectasis within the right middle lobe and lingula. Upper lobe predominant emphysema unchanged. Central airways are patent. Musculoskeletal:  No acute or destructive bony lesions. Reconstructed images demonstrate no additional findings. Review of the MIP images confirms the above findings. CT ABDOMEN and PELVIS FINDINGS Hepatobiliary: No focal liver abnormality is seen. No gallstones, gallbladder wall thickening, or biliary dilatation. Pancreas: Unremarkable. No pancreatic ductal dilatation or surrounding inflammatory changes. Spleen: Normal in size without focal abnormality. Adrenals/Urinary Tract: There is a stable 1.1 cm indeterminate hypodensity upper pole left kidney, mean attenuation 21 Hounsfield units, likely a small cyst. Otherwise the kidneys enhance normally and symmetrically. No urinary  tract calculi or obstructive uropathy. The bladder is unremarkable. The adrenals are normal. Stomach/Bowel: A dilated inflamed appendix is seen within the right lower quadrant, measuring up to 11 mm in diameter. There is appendiceal wall thickening with periappendiceal fat stranding, consistent with acute uncomplicated appendicitis. No perforation, fluid collection, or abscess. No bowel obstruction or ileus. Vascular/Lymphatic: Aortic atherosclerosis. No enlarged abdominal or pelvic lymph nodes. Reproductive: Uterus and bilateral adnexa are unremarkable. Other: No free fluid or free gas.  No abdominal wall hernia. Musculoskeletal: No acute or destructive bony lesions. Reconstructed images demonstrate no additional findings. Review of the MIP images confirms the above findings. IMPRESSION: 1. Acute uncomplicated appendicitis. No perforation, fluid collection, or abscess. 2. No evidence of pulmonary embolus. 3. Stable 1.1 cm indeterminate hypodensity upper pole left kidney, likely a small cyst. 4. Aortic Atherosclerosis (ICD10-I70.0) and Emphysema (ICD10-J43.9). Electronically Signed   By: Randa Ngo M.D.   On: 10/14/2019 19:12   CT ABDOMEN PELVIS W CONTRAST  Result Date: 10/14/2019 CLINICAL DATA:  Shortness of breath, right lower quadrant pain, nausea for 2 days, COPD EXAM: CT ANGIOGRAPHY CHEST CT ABDOMEN AND PELVIS WITH CONTRAST TECHNIQUE: Multidetector CT imaging of the chest was performed using the standard protocol during bolus administration of intravenous contrast. Multiplanar CT image reconstructions and MIPs were obtained to evaluate the vascular anatomy. Multidetector CT imaging of the abdomen and pelvis was performed using the standard protocol during bolus administration of intravenous contrast. CONTRAST:  124mL OMNIPAQUE IOHEXOL 350 MG/ML SOLN COMPARISON:  05/07/2017 FINDINGS: CTA CHEST FINDINGS Cardiovascular: This is a technically adequate evaluation of the pulmonary vasculature. There are no  filling defects or pulmonary emboli. The heart is unremarkable without pericardial effusion. No evidence of thoracic aortic aneurysm or dissection. Mild atherosclerosis of the aorta and coronary vessels. Mediastinum/Nodes: No enlarged mediastinal, hilar, or axillary lymph nodes. Thyroid gland, trachea, and esophagus demonstrate no significant findings. Lungs/Pleura: No acute airspace disease, effusion, or pneumothorax. Scattered atelectasis within the right middle lobe and lingula. Upper lobe predominant emphysema unchanged. Central airways are patent. Musculoskeletal: No acute or destructive bony lesions. Reconstructed images demonstrate no additional findings. Review of the MIP images confirms the above findings. CT ABDOMEN and PELVIS FINDINGS Hepatobiliary: No focal liver abnormality is seen. No gallstones, gallbladder wall thickening, or biliary dilatation. Pancreas: Unremarkable. No pancreatic ductal dilatation or surrounding inflammatory changes. Spleen: Normal in size without focal abnormality. Adrenals/Urinary Tract: There is a stable 1.1 cm indeterminate hypodensity upper pole left kidney, mean attenuation 21 Hounsfield units, likely a small cyst. Otherwise the kidneys enhance normally and symmetrically. No urinary tract calculi or obstructive uropathy. The bladder is unremarkable. The adrenals are normal. Stomach/Bowel: A dilated inflamed appendix is seen within the right lower quadrant, measuring up to 11 mm in diameter. There is appendiceal wall thickening with periappendiceal fat stranding, consistent with acute uncomplicated appendicitis. No perforation, fluid collection, or abscess. No bowel obstruction or ileus. Vascular/Lymphatic: Aortic atherosclerosis. No enlarged abdominal or pelvic lymph nodes. Reproductive:  Uterus and bilateral adnexa are unremarkable. Other: No free fluid or free gas.  No abdominal wall hernia. Musculoskeletal: No acute or destructive bony lesions. Reconstructed images  demonstrate no additional findings. Review of the MIP images confirms the above findings. IMPRESSION: 1. Acute uncomplicated appendicitis. No perforation, fluid collection, or abscess. 2. No evidence of pulmonary embolus. 3. Stable 1.1 cm indeterminate hypodensity upper pole left kidney, likely a small cyst. 4. Aortic Atherosclerosis (ICD10-I70.0) and Emphysema (ICD10-J43.9). Electronically Signed   By: Randa Ngo M.D.   On: 10/14/2019 19:12       Media Information   Document Information     ASSESSMENT/PLAN   Dyspnea with minimal exertion  - due to advanced COPD with chronic broncitic phenotype and moderate acute exacerbation   - solumedrol 40 bid   -will restart spiriva and symbicort per patients request  - s/p PFT today - FEV1- 27%- appreciate Respiratory therapist  -placed on BreoEllitpa - please continue  - IS at bedside for bronchopulmonary hygiene  -PT/OT  - abluterol nebulizer may change to DuOneb  -   Acute appendicitis   pre-operative pulmonary evaluation - overall moderate risk of post operative respiratory failure which may require prolonged mechanical ventilation, difficulty weaning from MV, increased O2 requirement.  Recommend to extubate to BIPAP for short period with transition to [place]     Thank you for allowing me to participate in the care of this patient.     Patient/Family are satisfied with care plan and all questions have been answered.   This document was prepared using Dragon voice recognition software and may include unintentional dictation errors.     [name], M.D.  Division of [employer]

## 2019-10-16 NOTE — Progress Notes (Signed)
[facility] Day(s): 2.   Post op day(s):  Marland Kitchen   Interval History: Patient seen and examined, no acute events or new complaints overnight. Patient reports the pain has not gotten worse.  The pain is controlled with mild pain.  Denies worsening pain with oral intake.  Denies any nausea or vomiting.  Denies any fever or chills.  Vital signs in last 24 hours: [min-max] current  Temp:  [97.6 F (36.4 C)-98.2 F (36.8 C)] 97.8 F (36.6 C) (07/22 1155) Pulse Rate:  [78-89] 82 (07/22 1155) Resp:  [12-20] 16 (07/22 1053) BP: (95-122)/(61-77) 109/66 (07/22 1155) SpO2:  [98 %-99 %] 98 % (07/22 1155)     Height: 5\' 2"  (157.5 cm) Weight: 78 kg BMI (Calculated): 31.45   Physical Exam:  Constitutional: alert, cooperative and no distress  Respiratory: breathing non-labored at rest  Cardiovascular: regular rate and sinus rhythm  Gastrointestinal: soft, mild-tender, and non-distended  Labs:  CBC Latest Ref Rng & Units 10/15/2019 10/14/2019 08/16/2019  WBC 4.0 - 10.5 K/uL 11.7(H) 16.7(H) 7.7  Hemoglobin 12.0 - 15.0 g/dL 11.0(L) 13.2 13.0  Hematocrit 36 - 46 % 32.5(L) 37.9 36.0  Platelets 150 - 400 K/uL 153 163 94(L)   CMP Latest Ref Rng & Units 10/15/2019 10/14/2019 08/18/2019  Glucose 70 - 99 mg/dL 92 127(H) -  BUN 6 - 20 mg/dL 15 17 -  Creatinine 0.44 - 1.00 mg/dL 0.78 0.76 -  Sodium 135 - 145 mmol/L 135 131(L) 128(L)  Potassium 3.5 - 5.1 mmol/L 3.6 4.2 -  Chloride 98 - 111 mmol/L 97(L) 93(L) -  CO2 22 - 32 mmol/L 28 27 -  Calcium 8.9 - 10.3 mg/dL 8.5(L) 9.2 -  Total Protein 6.5 - 8.1 g/dL - 9.0(H) -  Total Bilirubin 0.3 - 1.2 mg/dL - 1.2 -  Alkaline Phos 38 - 126 U/L - 97 -  AST 15 - 41 U/L - 24 -  ALT 0 - 44 U/L - 22 -    Imaging studies: No new pertinent imaging studies   Assessment/Plan:  54 y.o.femalewith acute appendicitis, complicated by pertinent comorbidities includingrecent stroke, CHF, COPD, coronary disease.  Patient without any clinical  deterioration.  I consider that the anxiety has been controlled with IV antibiotic therapy.  There has been no worsening abdominal pain.  I will repeat labs tomorrow for further evaluation of white blood cell and electrolytes.  I will continue with clear liquid to not put significant stress on the GI.  We will continue with DVT prophylaxis.  I appreciate consultants input and recommendations regarding perioperative management if antibiotic therapy does not work.  I encouraged the patient to ambulate.   Arnold Long, MD

## 2019-10-17 LAB — CBC
HCT: 32.4 % — ABNORMAL LOW (ref 36.0–46.0)
Hemoglobin: 10.9 g/dL — ABNORMAL LOW (ref 12.0–15.0)
MCH: 29.2 pg (ref 26.0–34.0)
MCHC: 33.6 g/dL (ref 30.0–36.0)
MCV: 86.9 fL (ref 80.0–100.0)
Platelets: 155 10*3/uL (ref 150–400)
RBC: 3.73 MIL/uL — ABNORMAL LOW (ref 3.87–5.11)
RDW: 14.6 % (ref 11.5–15.5)
WBC: 4.2 10*3/uL (ref 4.0–10.5)
nRBC: 0 % (ref 0.0–0.2)

## 2019-10-17 MED ORDER — SODIUM CHLORIDE 0.9 % IV SOLN
INTRAVENOUS | Status: DC | PRN
  Administered 2019-10-17 – 2019-10-18 (×3): 1000 mL via INTRAVENOUS

## 2019-10-17 NOTE — Progress Notes (Signed)
Pulmonary Medicine          Date: 10/17/2019,   MRN# [id number] Stephanie Ross Oct 15, 1965     AdmissionWeight: 78 kg                 CurrentWeight: 78 kg  Referring Physician: Dr Windell Moment    CHIEF COMPLAINT:   Abdominal pain   SUBJECTIVE   Patient is still having expiratory wheezing.  She is improved from yesteday.  Care plan has been discussed with Surgery team.  Plan for outpatient antibiotic therapy with outpatient evaluation and then surgery for appendicitis will be scheduled per surgery team.     From pulmonary perspective she will need 5 days of prednisone taper starting with 50mg  and going down by 10mg  daily.   She should be on zithromax 250mg  daily during this time.    Patient sees Dr Raul Del in pulmonary clinic and should follow up few days post D/C.    PAST MEDICAL HISTORY   Past Medical History:  Diagnosis Date  . Asthma   . Cancer ([place])   . COPD (chronic obstructive pulmonary disease) ([place])   . Hypertension      SURGICAL HISTORY   Past Surgical History:  Procedure Laterality Date  . BILATERAL CARPAL TUNNEL RELEASE    . CERVICAL ABLATION    . ROTATOR CUFF REPAIR Right      FAMILY HISTORY   Family History  Problem Relation Age of Onset  . Breast cancer Sister   . Breast cancer Paternal Grandmother      SOCIAL HISTORY   Social History   Tobacco Use  . Smoking status: Former Smoker    Packs/day: 0.25    Quit date: 07/17/2019    Years since quitting: 0.2  . Smokeless tobacco: Never Used  Vaping Use  . Vaping Use: Never used  Substance Use Topics  . Alcohol use: No  . Drug use: Yes    Types: Marijuana    Comment: occasionally     MEDICATIONS    Home Medication:    Current Medication:  Current Facility-Administered Medications:  .  acetaminophen (TYLENOL) tablet 650 mg, 650 mg, Oral, Q6H PRN **OR** acetaminophen (TYLENOL) suppository 650 mg, 650 mg, Rectal, Q6H PRN, [name], MD .  aspirin EC  tablet 81 mg, 81 mg, Oral, Daily, Cintron-Diaz, [name], MD, 81 mg at 10/17/19 1000 .  atorvastatin (LIPITOR) tablet 20 mg, 20 mg, Oral, Daily, [name], MD, 20 mg at 10/17/19 0959 .  ciprofloxacin (CIPRO) IVPB 400 mg, 400 mg, Intravenous, Q12H, Last Rate: 200 mL/hr at 10/17/19 0959, 400 mg at 10/17/19 0959 **AND** metroNIDAZOLE (FLAGYL) IVPB 500 mg, 500 mg, Intravenous, Q8H, Cintron-Diaz, [name], MD, Last Rate: 100 mL/hr at 10/17/19 0458, Rate Verify at 10/17/19 0458 .  enoxaparin (LOVENOX) injection 40 mg, 40 mg, Subcutaneous, Q24H, Cintron-Diaz, [name], MD, 40 mg at 10/17/19 1000 .  fluticasone furoate-vilanterol (BREO ELLIPTA) 200-25 MCG/INH 1 puff, 1 puff, Inhalation, Daily, [name], MD, 1 puff at 10/17/19 1018 .  HYDROcodone-acetaminophen (NORCO/VICODIN) 5-325 MG per tablet 1-2 tablet, 1-2 tablet, Oral, Q4H PRN, [name], MD, 2 tablet at 10/17/19 1206 .  ipratropium-albuterol (DUONEB) 0.5-2.5 (3) MG/3ML nebulizer solution 3 mL, 3 mL, Nebulization, Q6H, Nazir Hacker, MD, 3 mL at 10/17/19 1344 .  loratadine (CLARITIN) tablet 10 mg, 10 mg, Oral, Daily, [name], MD, 10 mg at 10/17/19 0959 .  methylPREDNISolone sodium succinate (SOLU-MEDROL) 40 mg/mL injection 40 mg, 40 mg, Intravenous, Q12H, [name], MD, 40  mg at 10/17/19 1000 .  montelukast (SINGULAIR) tablet 10 mg, 10 mg, Oral, QHS, [name], MD, 10 mg at 10/16/19 2110 .  morphine 4 MG/ML injection 4 mg, 4 mg, Intravenous, Q4H PRN, [name], MD .  nortriptyline (PAMELOR) capsule 20 mg, 20 mg, Oral, QHS, Cintron-Diaz, [name], MD, 20 mg at 10/16/19 2109 .  ondansetron (ZOFRAN-ODT) disintegrating tablet 4 mg, 4 mg, Oral, Q6H PRN **OR** ondansetron (ZOFRAN) injection 4 mg, 4 mg, Intravenous, Q6H PRN, Windell Moment, [name], MD .  pantoprazole (PROTONIX) injection 40 mg, 40 mg, Intravenous, QHS, Cintron-Diaz, [name], MD, 40 mg at 10/16/19 2109 .  sodium  chloride flush (NS) 0.9 % injection 3 mL, 3 mL, Intravenous, Once, [name], MD .  tiotropium (SPIRIVA) inhalation capsule ([facility] use ONLY) 18 mcg, 18 mcg, Inhalation, Daily, [name], MD, 18 mcg at 10/16/19 2108    ALLERGIES   Amoxicillin and Prednisone     REVIEW OF SYSTEMS    Review of Systems:  Gen:  Denies  fever, sweats, chills weigh loss  HEENT: Denies blurred vision, double vision, ear pain, eye pain, hearing loss, nose bleeds, sore throat Cardiac:  No dizziness, chest pain or heaviness, chest tightness,edema Resp:   Denies cough or sputum porduction, shortness of breath,wheezing, hemoptysis,  Gi: Denies swallowing difficulty, stomach pain, nausea or vomiting, diarrhea, constipation, bowel incontinence Gu:  Denies bladder incontinence, burning urine Ext:   Denies Joint pain, stiffness or swelling Skin: Denies  skin rash, easy bruising or bleeding or hives Endoc:  Denies polyuria, polydipsia , polyphagia or weight change Psych:   Denies depression, insomnia or hallucinations   Other:  All other systems negative   VS: BP (!) 141/69 (BP Location: Left Arm)   Pulse 87   Temp 98.3 F (36.8 C) (Oral)   Resp 16   Ht 5\' 2"  (1.575 m)   Wt 78 kg   SpO2 94%   BMI 31.46 kg/m      PHYSICAL EXAM    GENERAL:NAD, no fevers, chills, no weakness no fatigue HEAD: Normocephalic, atraumatic.  EYES: Pupils equal, round, reactive to light. Extraocular muscles intact. No scleral icterus.  MOUTH: Moist mucosal membrane. Dentition intact. No abscess noted.  EAR, NOSE, THROAT: Clear without exudates. No external lesions.  NECK: Supple. No thyromegaly. No nodules. No JVD.  PULMONARY: bilateral wheezing worse in expiratory phase CARDIOVASCULAR: S1 and S2. Regular rate and rhythm. No murmurs, rubs, or gallops. No edema. Pedal pulses 2+ bilaterally.  GASTROINTESTINAL: Soft, nontender, nondistended. No masses. Positive bowel sounds. No hepatosplenomegaly.    MUSCULOSKELETAL: No swelling, clubbing, or edema. Range of motion full in all extremities.  NEUROLOGIC: Cranial nerves II through XII are intact. No gross focal neurological deficits. Sensation intact. Reflexes intact.  SKIN: No ulceration, lesions, rashes, or cyanosis. Skin warm and dry. Turgor intact.  PSYCHIATRIC: Mood, affect within normal limits. The patient is awake, alert and oriented x 3. Insight, judgment intact.       IMAGING    CT Angio Chest PE W and/or Wo Contrast  Result Date: 10/14/2019 CLINICAL DATA:  Shortness of breath, right lower quadrant pain, nausea for 2 days, COPD EXAM: CT ANGIOGRAPHY CHEST CT ABDOMEN AND PELVIS WITH CONTRAST TECHNIQUE: Multidetector CT imaging of the chest was performed using the standard protocol during bolus administration of intravenous contrast. Multiplanar CT image reconstructions and MIPs were obtained to evaluate the vascular anatomy. Multidetector CT imaging of the abdomen and pelvis was performed using the standard protocol during bolus administration of intravenous contrast.  CONTRAST:  171mL OMNIPAQUE IOHEXOL 350 MG/ML SOLN COMPARISON:  05/07/2017 FINDINGS: CTA CHEST FINDINGS Cardiovascular: This is a technically adequate evaluation of the pulmonary vasculature. There are no filling defects or pulmonary emboli. The heart is unremarkable without pericardial effusion. No evidence of thoracic aortic aneurysm or dissection. Mild atherosclerosis of the aorta and coronary vessels. Mediastinum/Nodes: No enlarged mediastinal, hilar, or axillary lymph nodes. Thyroid gland, trachea, and esophagus demonstrate no significant findings. Lungs/Pleura: No acute airspace disease, effusion, or pneumothorax. Scattered atelectasis within the right middle lobe and lingula. Upper lobe predominant emphysema unchanged. Central airways are patent. Musculoskeletal: No acute or destructive bony lesions. Reconstructed images demonstrate no additional findings. Review of the MIP  images confirms the above findings. CT ABDOMEN and PELVIS FINDINGS Hepatobiliary: No focal liver abnormality is seen. No gallstones, gallbladder wall thickening, or biliary dilatation. Pancreas: Unremarkable. No pancreatic ductal dilatation or surrounding inflammatory changes. Spleen: Normal in size without focal abnormality. Adrenals/Urinary Tract: There is a stable 1.1 cm indeterminate hypodensity upper pole left kidney, mean attenuation 21 Hounsfield units, likely a small cyst. Otherwise the kidneys enhance normally and symmetrically. No urinary tract calculi or obstructive uropathy. The bladder is unremarkable. The adrenals are normal. Stomach/Bowel: A dilated inflamed appendix is seen within the right lower quadrant, measuring up to 11 mm in diameter. There is appendiceal wall thickening with periappendiceal fat stranding, consistent with acute uncomplicated appendicitis. No perforation, fluid collection, or abscess. No bowel obstruction or ileus. Vascular/Lymphatic: Aortic atherosclerosis. No enlarged abdominal or pelvic lymph nodes. Reproductive: Uterus and bilateral adnexa are unremarkable. Other: No free fluid or free gas.  No abdominal wall hernia. Musculoskeletal: No acute or destructive bony lesions. Reconstructed images demonstrate no additional findings. Review of the MIP images confirms the above findings. IMPRESSION: 1. Acute uncomplicated appendicitis. No perforation, fluid collection, or abscess. 2. No evidence of pulmonary embolus. 3. Stable 1.1 cm indeterminate hypodensity upper pole left kidney, likely a small cyst. 4. Aortic Atherosclerosis (ICD10-I70.0) and Emphysema (ICD10-J43.9). Electronically Signed   By: Randa Ngo M.D.   On: 10/14/2019 19:12   CT ABDOMEN PELVIS W CONTRAST  Result Date: 10/14/2019 CLINICAL DATA:  Shortness of breath, right lower quadrant pain, nausea for 2 days, COPD EXAM: CT ANGIOGRAPHY CHEST CT ABDOMEN AND PELVIS WITH CONTRAST TECHNIQUE: Multidetector CT imaging  of the chest was performed using the standard protocol during bolus administration of intravenous contrast. Multiplanar CT image reconstructions and MIPs were obtained to evaluate the vascular anatomy. Multidetector CT imaging of the abdomen and pelvis was performed using the standard protocol during bolus administration of intravenous contrast. CONTRAST:  118mL OMNIPAQUE IOHEXOL 350 MG/ML SOLN COMPARISON:  05/07/2017 FINDINGS: CTA CHEST FINDINGS Cardiovascular: This is a technically adequate evaluation of the pulmonary vasculature. There are no filling defects or pulmonary emboli. The heart is unremarkable without pericardial effusion. No evidence of thoracic aortic aneurysm or dissection. Mild atherosclerosis of the aorta and coronary vessels. Mediastinum/Nodes: No enlarged mediastinal, hilar, or axillary lymph nodes. Thyroid gland, trachea, and esophagus demonstrate no significant findings. Lungs/Pleura: No acute airspace disease, effusion, or pneumothorax. Scattered atelectasis within the right middle lobe and lingula. Upper lobe predominant emphysema unchanged. Central airways are patent. Musculoskeletal: No acute or destructive bony lesions. Reconstructed images demonstrate no additional findings. Review of the MIP images confirms the above findings. CT ABDOMEN and PELVIS FINDINGS Hepatobiliary: No focal liver abnormality is seen. No gallstones, gallbladder wall thickening, or biliary dilatation. Pancreas: Unremarkable. No pancreatic ductal dilatation or surrounding inflammatory changes. Spleen: Normal in size without  focal abnormality. Adrenals/Urinary Tract: There is a stable 1.1 cm indeterminate hypodensity upper pole left kidney, mean attenuation 21 Hounsfield units, likely a small cyst. Otherwise the kidneys enhance normally and symmetrically. No urinary tract calculi or obstructive uropathy. The bladder is unremarkable. The adrenals are normal. Stomach/Bowel: A dilated inflamed appendix is seen within  the right lower quadrant, measuring up to 11 mm in diameter. There is appendiceal wall thickening with periappendiceal fat stranding, consistent with acute uncomplicated appendicitis. No perforation, fluid collection, or abscess. No bowel obstruction or ileus. Vascular/Lymphatic: Aortic atherosclerosis. No enlarged abdominal or pelvic lymph nodes. Reproductive: Uterus and bilateral adnexa are unremarkable. Other: No free fluid or free gas.  No abdominal wall hernia. Musculoskeletal: No acute or destructive bony lesions. Reconstructed images demonstrate no additional findings. Review of the MIP images confirms the above findings. IMPRESSION: 1. Acute uncomplicated appendicitis. No perforation, fluid collection, or abscess. 2. No evidence of pulmonary embolus. 3. Stable 1.1 cm indeterminate hypodensity upper pole left kidney, likely a small cyst. 4. Aortic Atherosclerosis (ICD10-I70.0) and Emphysema (ICD10-J43.9). Electronically Signed   By: Randa Ngo M.D.   On: 10/14/2019 19:12       Media Information   Document Information     ASSESSMENT/PLAN   Dyspnea with minimal exertion  - due to advanced COPD with chronic broncitic phenotype and moderate acute exacerbation   - solumedrol 40 bid   -will restart spiriva and symbicort per patients request  - s/p PFT today - FEV1- 27%- appreciate Respiratory therapist  -placed on BreoEllitpa - please continue  - IS at bedside for bronchopulmonary hygiene  -PT/OT  - abluterol nebulizer may change to DuOneb  -wheezing and rhonchi have improved - optimizing for d/c home    Acute appendicitis   pre-operative pulmonary evaluation - overall moderate risk of post operative respiratory failure which may require prolonged mechanical ventilation, difficulty weaning from MV, increased O2 requirement.  Recommend to extubate to BIPAP for short period with transition to [place]     Thank you for allowing me to participate in the care of this patient.       Patient/Family are satisfied with care plan and all questions have been answered.   This document was prepared using Dragon voice recognition software and may include unintentional dictation errors.     [name], M.D.  Division of [place]

## 2019-10-17 NOTE — Progress Notes (Addendum)
   10/17/19 1000  Clinical Encounter Type  Visited With Patient  Visit Type Initial  Referral From Nurse  Consult/Referral To Chaplain  Chaplain responded to an OR for a Bible. When Chaplain arrived at the room, Patient was standing up. When she saw the Bible she said "Thank You, I left my Bible Home and I miss them being in the rooms." Chaplain gave Patient a large print New Testament. Patient told Chaplain that staff tried giving her breathing treatments and she told them that she needed her COPD medicine before treatment." She said once COPD medicine was given she felt much better immediately. Patient told Chaplain that she had a stroke three months ago. Chaplain marveled at how she looked and was doing. Patient told Chaplain she graduated from physical therapy last week and is doing well. After about 12 mins. Patient said "let me get in here (bathroom). Patient is hoping to be discharged today. Chaplain told her that she will be praying for her.

## 2019-10-17 NOTE — Progress Notes (Signed)
[place] Hospital Day(s): 3.   Post op day(s):  Marland Kitchen   Interval History: Patient seen and examined, no acute events or new complaints overnight. Patient reports feeling better today. Report abdominal pain improving. Denies nausea or vomiting. Report having bowel movement. Denies chills.   Vital signs in last 24 hours: [min-max] current  Temp:  [98.1 F (36.7 C)-98.3 F (36.8 C)] 98.3 F (36.8 C) (07/23 0428) Pulse Rate:  [83-87] 87 (07/23 1210) Resp:  [16-18] 16 (07/23 0428) BP: (108-141)/(55-69) 141/69 (07/23 1210) SpO2:  [94 %-98 %] 94 % (07/23 1210)     Height: 5\' 2"  (157.5 cm) Weight: 78 kg BMI (Calculated): 31.45   Physical Exam:  Constitutional: alert, cooperative and no distress  Respiratory: breathing non-labored at rest with nasal canula  Cardiovascular: regular rate and sinus rhythm  Gastrointestinal: soft, mild-tender, and non-distended  Labs:  CBC Latest Ref Rng & Units 10/17/2019 10/15/2019 10/14/2019  WBC 4.0 - 10.5 K/uL 4.2 11.7(H) 16.7(H)  Hemoglobin 12.0 - 15.0 g/dL 10.9(L) 11.0(L) 13.2  Hematocrit 36 - 46 % 32.4(L) 32.5(L) 37.9  Platelets 150 - 400 K/uL 155 153 163   CMP Latest Ref Rng & Units 10/15/2019 10/14/2019 08/18/2019  Glucose 70 - 99 mg/dL 92 127(H) -  BUN 6 - 20 mg/dL 15 17 -  Creatinine 0.44 - 1.00 mg/dL 0.78 0.76 -  Sodium 135 - 145 mmol/L 135 131(L) 128(L)  Potassium 3.5 - 5.1 mmol/L 3.6 4.2 -  Chloride 98 - 111 mmol/L 97(L) 93(L) -  CO2 22 - 32 mmol/L 28 27 -  Calcium 8.9 - 10.3 mg/dL 8.5(L) 9.2 -  Total Protein 6.5 - 8.1 g/dL - 9.0(H) -  Total Bilirubin 0.3 - 1.2 mg/dL - 1.2 -  Alkaline Phos 38 - 126 U/L - 97 -  AST 15 - 41 U/L - 24 -  ALT 0 - 44 U/L - 22 -    Imaging studies: No new pertinent imaging studies   Assessment/Plan:  54 y.o.femalewith acute appendicitis, complicated by pertinent comorbidities includingrecent stroke, CHF, COPD, coronary disease.  Patient today with improved pain.  She continues tolerating  diet.  She is having bowel movement.  I will advance her diet to full liquids.  The white blood cell came down to 4.2 from 11.7 from initial white blood cell count of 16.  This is a good sign that she is responding to IV antibiotic therapy.  There has been no clinical deterioration.  I think that patient will respond adequately to the antibiotic therapy.  We will continue with IV antibiotic therapy since she is still having some pain but is improving.  I encouraged the patient to ambulate.  Arnold Long, MD

## 2019-10-18 MED ORDER — METRONIDAZOLE 500 MG PO TABS
500.0000 mg | ORAL_TABLET | Freq: Three times a day (TID) | ORAL | 0 refills | Status: AC
Start: 2019-10-18 — End: 2019-10-28

## 2019-10-18 MED ORDER — CIPROFLOXACIN HCL 500 MG PO TABS
500.0000 mg | ORAL_TABLET | Freq: Two times a day (BID) | ORAL | 0 refills | Status: AC
Start: 2019-10-18 — End: 2019-10-28

## 2019-10-18 MED ORDER — AZITHROMYCIN 250 MG PO TABS
ORAL_TABLET | ORAL | 0 refills | Status: DC
Start: 2019-10-18 — End: 2020-11-21

## 2019-10-18 MED ORDER — HYDROCODONE-ACETAMINOPHEN 5-325 MG PO TABS: 1.0000 | ORAL_TABLET | ORAL | 0 refills | Status: AC | PRN

## 2019-10-18 MED ORDER — DEXAMETHASONE 4 MG PO TABS
4.0000 mg | ORAL_TABLET | Freq: Every day | ORAL | 0 refills | Status: AC
Start: 2019-10-18 — End: 2019-10-23

## 2019-10-18 NOTE — Progress Notes (Signed)
Pulmonary Medicine          Date: 10/18/2019,   MRN# [id number] Stephanie Ross [birth year]     AdmissionWeight: 78 kg                 CurrentWeight: 78 kg  Referring Physician: Dr Windell Moment    CHIEF COMPLAINT:   Abdominal pain   SUBJECTIVE    From pulmonary perspective she will need 5 days of Dexamethasone 4mg .  She should be on zithromax 250mg  daily during this time.    Patient sees Dr Raul Del in pulmonary clinic and should follow up few days post D/C.    PAST MEDICAL HISTORY   Past Medical History:  Diagnosis Date  . Asthma   . Cancer ([place])   . COPD (chronic obstructive pulmonary disease) ([place])   . Hypertension      SURGICAL HISTORY   Past Surgical History:  Procedure Laterality Date  . BILATERAL CARPAL TUNNEL RELEASE    . CERVICAL ABLATION    . ROTATOR CUFF REPAIR Right      FAMILY HISTORY   Family History  Problem Relation Age of Onset  . Breast cancer Sister   . Breast cancer Paternal Grandmother      SOCIAL HISTORY   Social History   Tobacco Use  . Smoking status: Former Smoker    Packs/day: 0.25    Quit date: 07/17/2019    Years since quitting: 0.2  . Smokeless tobacco: Never Used  Vaping Use  . Vaping Use: Never used  Substance Use Topics  . Alcohol use: No  . Drug use: Yes    Types: Marijuana    Comment: occasionally     MEDICATIONS    Home Medication:    Current Medication:  Current Facility-Administered Medications:  .  0.9 %  sodium chloride infusion, , Intravenous, PRN, [name], MD, Last Rate: 5 mL/hr at 10/18/19 0910, 1,000 mL at 10/18/19 0910 .  acetaminophen (TYLENOL) tablet 650 mg, 650 mg, Oral, Q6H PRN **OR** acetaminophen (TYLENOL) suppository 650 mg, 650 mg, Rectal, Q6H PRN, [name], MD .  aspirin EC tablet 81 mg, 81 mg, Oral, Daily, [name], MD, 81 mg at 10/18/19 0912 .  atorvastatin (LIPITOR) tablet 20 mg, 20 mg, Oral, Daily, [name],  MD, 20 mg at 10/18/19 0912 .  ciprofloxacin (CIPRO) IVPB 400 mg, 400 mg, Intravenous, Q12H, Stopped at 10/17/19 2354 **AND** metroNIDAZOLE (FLAGYL) IVPB 500 mg, 500 mg, Intravenous, Q8H, Cintron-Diaz, [name], MD, Last Rate: 100 mL/hr at 10/18/19 0556, 500 mg at 10/18/19 0556 .  enoxaparin (LOVENOX) injection 40 mg, 40 mg, Subcutaneous, Q24H, Cintron-Diaz, [name], MD, 40 mg at 10/18/19 0912 .  fluticasone furoate-vilanterol (BREO ELLIPTA) 200-25 MCG/INH 1 puff, 1 puff, Inhalation, Daily, [name], MD, 1 puff at 10/18/19 0913 .  HYDROcodone-acetaminophen (NORCO/VICODIN) 5-325 MG per tablet 1-2 tablet, 1-2 tablet, Oral, Q4H PRN, [name], MD, 2 tablet at 10/17/19 1938 .  ipratropium-albuterol (DUONEB) 0.5-2.5 (3) MG/3ML nebulizer solution 3 mL, 3 mL, Nebulization, Q6H, Lanney Gins, Manases Etchison, MD, 3 mL at 10/18/19 0745 .  loratadine (CLARITIN) tablet 10 mg, 10 mg, Oral, Daily, [name], MD, 10 mg at 10/18/19 0912 .  methylPREDNISolone sodium succinate (SOLU-MEDROL) 40 mg/mL injection 40 mg, 40 mg, Intravenous, Q12H, Lanney Gins, Sakiya Stepka, MD, 40 mg at 10/18/19 0912 .  montelukast (SINGULAIR) tablet 10 mg, 10 mg, Oral, QHS, [name], MD, 10 mg at 10/17/19 2141 .  morphine 4 MG/ML injection 4 mg, 4 mg, Intravenous, Q4H PRN,  [name], MD .  nortriptyline (PAMELOR) capsule 20 mg, 20 mg, Oral, QHS, [name], MD, 20 mg at 10/17/19 2147 .  ondansetron (ZOFRAN-ODT) disintegrating tablet 4 mg, 4 mg, Oral, Q6H PRN **OR** ondansetron (ZOFRAN) injection 4 mg, 4 mg, Intravenous, Q6H PRN, Windell Moment, [name], MD .  pantoprazole (PROTONIX) injection 40 mg, 40 mg, Intravenous, QHS, Cintron-Diaz, [name], MD, 40 mg at 10/17/19 2145 .  sodium chloride flush (NS) 0.9 % injection 3 mL, 3 mL, Intravenous, Once, [name], MD .  tiotropium (SPIRIVA) inhalation capsule ([facility] use ONLY) 18 mcg, 18 mcg, Inhalation, Daily, [name], MD, 18 mcg at  10/18/19 0913    ALLERGIES   Amoxicillin and Prednisone     REVIEW OF SYSTEMS    Review of Systems:  Gen:  Denies  fever, sweats, chills weigh loss  HEENT: Denies blurred vision, double vision, ear pain, eye pain, hearing loss, nose bleeds, sore throat Cardiac:  No dizziness, chest pain or heaviness, chest tightness,edema Resp:   Denies cough or sputum porduction, shortness of breath,wheezing, hemoptysis,  Gi: Denies swallowing difficulty, stomach pain, nausea or vomiting, diarrhea, constipation, bowel incontinence Gu:  Denies bladder incontinence, burning urine Ext:   Denies Joint pain, stiffness or swelling Skin: Denies  skin rash, easy bruising or bleeding or hives Endoc:  Denies polyuria, polydipsia , polyphagia or weight change Psych:   Denies depression, insomnia or hallucinations   Other:  All other systems negative   VS: BP (!) 129/62 (BP Location: Left Arm)   Pulse 83   Temp 97.7 F (36.5 C) (Oral)   Resp 20   Ht 5\' 2"  (1.575 m)   Wt 78 kg   SpO2 97%   BMI 31.46 kg/m      PHYSICAL EXAM    GENERAL:NAD, no fevers, chills, no weakness no fatigue HEAD: Normocephalic, atraumatic.  EYES: Pupils equal, round, reactive to light. Extraocular muscles intact. No scleral icterus.  MOUTH: Moist mucosal membrane. Dentition intact. No abscess noted.  EAR, NOSE, THROAT: Clear without exudates. No external lesions.  NECK: Supple. No thyromegaly. No nodules. No JVD.  PULMONARY: bilateral wheezing worse in expiratory phase CARDIOVASCULAR: S1 and S2. Regular rate and rhythm. No murmurs, rubs, or gallops. No edema. Pedal pulses 2+ bilaterally.  GASTROINTESTINAL: Soft, nontender, nondistended. No masses. Positive bowel sounds. No hepatosplenomegaly.  MUSCULOSKELETAL: No swelling, clubbing, or edema. Range of motion full in all extremities.  NEUROLOGIC: Cranial nerves II through XII are intact. No gross focal neurological deficits. Sensation intact. Reflexes intact.  SKIN:  No ulceration, lesions, rashes, or cyanosis. Skin warm and dry. Turgor intact.  PSYCHIATRIC: Mood, affect within normal limits. The patient is awake, alert and oriented x 3. Insight, judgment intact.       IMAGING    CT Angio Chest PE W and/or Wo Contrast  Result Date: 10/14/2019 CLINICAL DATA:  Shortness of breath, right lower quadrant pain, nausea for 2 days, COPD EXAM: CT ANGIOGRAPHY CHEST CT ABDOMEN AND PELVIS WITH CONTRAST TECHNIQUE: Multidetector CT imaging of the chest was performed using the standard protocol during bolus administration of intravenous contrast. Multiplanar CT image reconstructions and MIPs were obtained to evaluate the vascular anatomy. Multidetector CT imaging of the abdomen and pelvis was performed using the standard protocol during bolus administration of intravenous contrast. CONTRAST:  138mL OMNIPAQUE IOHEXOL 350 MG/ML SOLN COMPARISON:  05/07/2017 FINDINGS: CTA CHEST FINDINGS Cardiovascular: This is a technically adequate evaluation of the pulmonary vasculature. There are no filling defects or pulmonary emboli. The heart is  unremarkable without pericardial effusion. No evidence of thoracic aortic aneurysm or dissection. Mild atherosclerosis of the aorta and coronary vessels. Mediastinum/Nodes: No enlarged mediastinal, hilar, or axillary lymph nodes. Thyroid gland, trachea, and esophagus demonstrate no significant findings. Lungs/Pleura: No acute airspace disease, effusion, or pneumothorax. Scattered atelectasis within the right middle lobe and lingula. Upper lobe predominant emphysema unchanged. Central airways are patent. Musculoskeletal: No acute or destructive bony lesions. Reconstructed images demonstrate no additional findings. Review of the MIP images confirms the above findings. CT ABDOMEN and PELVIS FINDINGS Hepatobiliary: No focal liver abnormality is seen. No gallstones, gallbladder wall thickening, or biliary dilatation. Pancreas: Unremarkable. No pancreatic ductal  dilatation or surrounding inflammatory changes. Spleen: Normal in size without focal abnormality. Adrenals/Urinary Tract: There is a stable 1.1 cm indeterminate hypodensity upper pole left kidney, mean attenuation 21 Hounsfield units, likely a small cyst. Otherwise the kidneys enhance normally and symmetrically. No urinary tract calculi or obstructive uropathy. The bladder is unremarkable. The adrenals are normal. Stomach/Bowel: A dilated inflamed appendix is seen within the right lower quadrant, measuring up to 11 mm in diameter. There is appendiceal wall thickening with periappendiceal fat stranding, consistent with acute uncomplicated appendicitis. No perforation, fluid collection, or abscess. No bowel obstruction or ileus. Vascular/Lymphatic: Aortic atherosclerosis. No enlarged abdominal or pelvic lymph nodes. Reproductive: Uterus and bilateral adnexa are unremarkable. Other: No free fluid or free gas.  No abdominal wall hernia. Musculoskeletal: No acute or destructive bony lesions. Reconstructed images demonstrate no additional findings. Review of the MIP images confirms the above findings. IMPRESSION: 1. Acute uncomplicated appendicitis. No perforation, fluid collection, or abscess. 2. No evidence of pulmonary embolus. 3. Stable 1.1 cm indeterminate hypodensity upper pole left kidney, likely a small cyst. 4. Aortic Atherosclerosis (ICD10-I70.0) and Emphysema (ICD10-J43.9). Electronically Signed   By: Randa Ngo M.D.   On: 10/14/2019 19:12   CT ABDOMEN PELVIS W CONTRAST  Result Date: 10/14/2019 CLINICAL DATA:  Shortness of breath, right lower quadrant pain, nausea for 2 days, COPD EXAM: CT ANGIOGRAPHY CHEST CT ABDOMEN AND PELVIS WITH CONTRAST TECHNIQUE: Multidetector CT imaging of the chest was performed using the standard protocol during bolus administration of intravenous contrast. Multiplanar CT image reconstructions and MIPs were obtained to evaluate the vascular anatomy. Multidetector CT imaging of  the abdomen and pelvis was performed using the standard protocol during bolus administration of intravenous contrast. CONTRAST:  161mL OMNIPAQUE IOHEXOL 350 MG/ML SOLN COMPARISON:  05/07/2017 FINDINGS: CTA CHEST FINDINGS Cardiovascular: This is a technically adequate evaluation of the pulmonary vasculature. There are no filling defects or pulmonary emboli. The heart is unremarkable without pericardial effusion. No evidence of thoracic aortic aneurysm or dissection. Mild atherosclerosis of the aorta and coronary vessels. Mediastinum/Nodes: No enlarged mediastinal, hilar, or axillary lymph nodes. Thyroid gland, trachea, and esophagus demonstrate no significant findings. Lungs/Pleura: No acute airspace disease, effusion, or pneumothorax. Scattered atelectasis within the right middle lobe and lingula. Upper lobe predominant emphysema unchanged. Central airways are patent. Musculoskeletal: No acute or destructive bony lesions. Reconstructed images demonstrate no additional findings. Review of the MIP images confirms the above findings. CT ABDOMEN and PELVIS FINDINGS Hepatobiliary: No focal liver abnormality is seen. No gallstones, gallbladder wall thickening, or biliary dilatation. Pancreas: Unremarkable. No pancreatic ductal dilatation or surrounding inflammatory changes. Spleen: Normal in size without focal abnormality. Adrenals/Urinary Tract: There is a stable 1.1 cm indeterminate hypodensity upper pole left kidney, mean attenuation 21 Hounsfield units, likely a small cyst. Otherwise the kidneys enhance normally and symmetrically. No urinary tract calculi or  obstructive uropathy. The bladder is unremarkable. The adrenals are normal. Stomach/Bowel: A dilated inflamed appendix is seen within the right lower quadrant, measuring up to 11 mm in diameter. There is appendiceal wall thickening with periappendiceal fat stranding, consistent with acute uncomplicated appendicitis. No perforation, fluid collection, or abscess. No  bowel obstruction or ileus. Vascular/Lymphatic: Aortic atherosclerosis. No enlarged abdominal or pelvic lymph nodes. Reproductive: Uterus and bilateral adnexa are unremarkable. Other: No free fluid or free gas.  No abdominal wall hernia. Musculoskeletal: No acute or destructive bony lesions. Reconstructed images demonstrate no additional findings. Review of the MIP images confirms the above findings. IMPRESSION: 1. Acute uncomplicated appendicitis. No perforation, fluid collection, or abscess. 2. No evidence of pulmonary embolus. 3. Stable 1.1 cm indeterminate hypodensity upper pole left kidney, likely a small cyst. 4. Aortic Atherosclerosis (ICD10-I70.0) and Emphysema (ICD10-J43.9). Electronically Signed   By: Randa Ngo M.D.   On: 10/14/2019 19:12       Media Information   Document Information     ASSESSMENT/PLAN   Dyspnea with minimal exertion  - due to advanced COPD with chronic broncitic phenotype and moderate acute exacerbation   - solumedrol 40 bid   -will restart spiriva and symbicort per patients request  - s/p PFT today - FEV1- 27%- appreciate Respiratory therapist  -placed on BreoEllitpa - please continue  - IS at bedside for bronchopulmonary hygiene  -PT/OT  - abluterol nebulizer may change to DuOneb  -wheezing and rhonchi have improved - optimizing for d/c home    Acute appendicitis   pre-operative pulmonary evaluation - overall moderate risk of post operative respiratory failure which may require prolonged mechanical ventilation, difficulty weaning from MV, increased O2 requirement.  Recommend to extubate to BIPAP for short period with transition to [place]     Thank you for allowing me to participate in the care of this patient.     Patient/Family are satisfied with care plan and all questions have been answered.   This document was prepared using Dragon voice recognition software and may include unintentional dictation errors.     [name], M.D.  Division  of [place]

## 2019-10-18 NOTE — Discharge Summary (Signed)
Patient ID: Stephanie Ross MRN: [id number] DOB/AGE: 31-Aug-1965 54 y.o.  Admit date: 10/14/2019 Discharge date: 10/18/2019   Discharge Diagnoses:  Active Problems:   Acute appendicitis   Procedures: None  Hospital Course: Patient with acute appendicitis without rupture.  Due to the patient severe COPD and recent stroke she was assessed to be high risk for surgery.  She was managed conservatively with IV antibiotic therapy.  She responded adequately to the IV antibiotic therapy with improved pain.  White blood cell count came down to 4 from 16,000 on admission.  She was also evaluated by cardiology who assessed she was a acceptable candidate for surgery.  Neurology also assessed that the patient is a high risk for surgery due to recent stroke.  Pulmonology assessed the patient very high risk for surgery and high risk for prolonged intubation due to her severe COPD with persistent wheezing.  She has been receiving steroid therapy for her COPD.  I discussed the case with pulmonology and she will continue a short course of steroid therapy and antibiotic therapy for her COPD.  Patient tolerating diet without abdominal pain.  Physical Exam Constitutional:      Appearance: She is well-developed.  Cardiovascular:     Rate and Rhythm: Normal rate and regular rhythm.  Pulmonary:     Effort: Pulmonary effort is normal. No respiratory distress.     Breath sounds: Wheezing present.  Abdominal:     General: Abdomen is flat. Bowel sounds are normal. There is no distension.     Palpations: Abdomen is soft.     Tenderness: There is no abdominal tenderness.  Skin:    General: Skin is warm.  Neurological:     General: No focal deficit present.     Mental Status: She is alert and oriented to person, place, and time.      Consults: Cardiology, neurology, pulmonology  Disposition: Discharge disposition: 01-Home or Self Care       Discharge Instructions    Diet - low sodium heart healthy    Complete by: As directed    Increase activity slowly   Complete by: As directed      Allergies as of 10/18/2019      Reactions   Amoxicillin Other (See Comments)   Has patient had a PCN reaction causing immediate rash, facial/tongue/throat swelling, SOB or lightheadedness with hypotension: Yes Has patient had a PCN reaction causing severe rash involving mucus membranes or skin necrosis: Yes Has patient had a PCN reaction that required hospitalization:yes Has patient had a PCN reaction occurring within the last 10 years: yes If all of the above answers are "NO", then may proceed with Cephalosporin use.   Prednisone Other (See Comments)   Full body rash       Medication List    TAKE these medications   albuterol 108 (90 Base) MCG/ACT inhaler Commonly known as: VENTOLIN HFA Inhale 2 puffs into the lungs every 4 (four) hours as needed for wheezing or shortness of breath.   amLODipine 5 MG tablet Commonly known as: NORVASC Take 5 mg by mouth at bedtime.   aspirin EC 81 MG tablet Take 1 tablet (81 mg total) by mouth daily.   atorvastatin 20 MG tablet Commonly known as: LIPITOR Take 20 mg by mouth daily.   azithromycin 250 MG tablet Commonly known as: Zithromax Take 250 (1 pill) daily   budesonide-formoterol 160-4.5 MCG/ACT inhaler Commonly known as: SYMBICORT Inhale 2 puffs into the lungs 2 (two) times daily.  calcium carbonate 1250 (500 Ca) MG chewable tablet Commonly known as: OS-CAL Chew 500 mg by mouth daily.   celecoxib 200 MG capsule Commonly known as: CELEBREX Take 200 mg by mouth daily.   cetirizine 10 MG tablet Commonly known as: ZYRTEC Take 10 mg by mouth daily as needed for allergies.   ciprofloxacin 500 MG tablet Commonly known as: Cipro Take 1 tablet (500 mg total) by mouth 2 (two) times daily for 10 days.   clopidogrel 75 MG tablet Commonly known as: PLAVIX Take 75 mg by mouth daily.   CoQ10 100 MG Caps Take 100 mg by mouth daily.    dexamethasone 4 MG tablet Commonly known as: DECADRON Take 1 tablet (4 mg total) by mouth daily for 5 days.   ergocalciferol 1.25 MG (50000 UT) capsule Commonly known as: VITAMIN D2 Take 50,000 Units by mouth once a week.   HYDROcodone-acetaminophen 5-325 MG tablet Commonly known as: Norco Take 1 tablet by mouth every 4 (four) hours as needed for up to 3 days for moderate pain.   metroNIDAZOLE 500 MG tablet Commonly known as: Flagyl Take 1 tablet (500 mg total) by mouth 3 (three) times daily for 10 days.   montelukast 10 MG tablet Commonly known as: SINGULAIR Take 10 mg by mouth at bedtime.   nortriptyline 10 MG capsule Commonly known as: PAMELOR Take 20 mg by mouth at bedtime.   Spiriva HandiHaler 18 MCG inhalation capsule Generic drug: tiotropium Place 1 capsule into inhaler and inhale daily.       Follow-up Information    [name], MD Follow up in 2 week(s).   Specialty: General Surgery Contact information: [place] [address] [phone]        Erby Pian, MD Follow up in 1 week(s).   Specialty: Specialist Contact information: [place] [address] [phone]              This discharge encounter was more than 30-minute most of the time coordinating plan of care.

## 2019-10-18 NOTE — Discharge Instructions (Signed)
  Diet: Resume home heart healthy regular diet.   Activity: Increase activity as tolerated.   Light activity and walking are encouraged. Do not drive or drink alcohol if taking narcotic pain medications.  Medications: Resume all home medications. For mild to moderate pain: acetaminophen (Tylenol) or ibuprofen (if no kidney disease). Combining Tylenol with alcohol can substantially increase your risk of causing liver disease. Narcotic pain medications, if prescribed, can be used for severe pain, though may cause nausea, constipation, and drowsiness. Do not combine Tylenol and Norco within a 6 hour period as Norco contains Tylenol. If you do not need the narcotic pain medication, you do not need to fill the prescription.  Take dexamethasone 4 mg p.o. 1 pill every day for 5 days Take Zithromax 250 mg 1 pill every day for 5 days  Take Cipro and Flagyl (antibiotic therapy) for the next 10 days as prescribed.  Continue oxygen at home as directed before.  Call office [phone]) at any time if any questions, worsening pain, fevers/chills, bleeding, drainage from incision site, or other concerns.

## 2019-10-19 LAB — CULTURE, BLOOD (ROUTINE X 2): Culture: NO GROWTH

## 2019-10-20 LAB — CULTURE, BLOOD (ROUTINE X 2)
Culture: NO GROWTH
Special Requests: ADEQUATE

## 2020-09-23 ENCOUNTER — Other Ambulatory Visit: Payer: Self-pay | Admitting: Specialist

## 2020-09-23 DIAGNOSIS — R0600 Dyspnea, unspecified: Secondary | ICD-10-CM

## 2020-09-23 DIAGNOSIS — J449 Chronic obstructive pulmonary disease, unspecified: Secondary | ICD-10-CM

## 2020-09-23 DIAGNOSIS — Z9981 Dependence on supplemental oxygen: Secondary | ICD-10-CM

## 2020-09-23 DIAGNOSIS — R0609 Other forms of dyspnea: Secondary | ICD-10-CM

## 2020-09-23 DIAGNOSIS — E663 Overweight: Secondary | ICD-10-CM

## 2020-10-06 ENCOUNTER — Emergency Department
Admission: EM | Admit: 2020-10-06 | Discharge: 2020-10-06 | Disposition: A | Discharge status: Home / Self Care | Payer: Medicare Other | Attending: Emergency Medicine | Admitting: Emergency Medicine

## 2020-10-06 ENCOUNTER — Emergency Department: Payer: Medicare Other

## 2020-10-06 ENCOUNTER — Other Ambulatory Visit: Payer: Self-pay

## 2020-10-06 DIAGNOSIS — Z20822 Contact with and (suspected) exposure to covid-19: Secondary | ICD-10-CM | POA: Diagnosis not present

## 2020-10-06 DIAGNOSIS — J45909 Unspecified asthma, uncomplicated: Secondary | ICD-10-CM | POA: Diagnosis not present

## 2020-10-06 DIAGNOSIS — I10 Essential (primary) hypertension: Secondary | ICD-10-CM | POA: Insufficient documentation

## 2020-10-06 DIAGNOSIS — Z79899 Other long term (current) drug therapy: Secondary | ICD-10-CM | POA: Insufficient documentation

## 2020-10-06 DIAGNOSIS — Z7902 Long term (current) use of antithrombotics/antiplatelets: Secondary | ICD-10-CM | POA: Insufficient documentation

## 2020-10-06 DIAGNOSIS — Z7951 Long term (current) use of inhaled steroids: Secondary | ICD-10-CM | POA: Diagnosis not present

## 2020-10-06 DIAGNOSIS — Z28310 Unvaccinated for covid-19: Secondary | ICD-10-CM | POA: Diagnosis not present

## 2020-10-06 DIAGNOSIS — J189 Pneumonia, unspecified organism: Secondary | ICD-10-CM | POA: Diagnosis not present

## 2020-10-06 DIAGNOSIS — R059 Cough, unspecified: Secondary | ICD-10-CM | POA: Diagnosis present

## 2020-10-06 DIAGNOSIS — J449 Chronic obstructive pulmonary disease, unspecified: Secondary | ICD-10-CM | POA: Insufficient documentation

## 2020-10-06 DIAGNOSIS — Z87891 Personal history of nicotine dependence: Secondary | ICD-10-CM | POA: Diagnosis not present

## 2020-10-06 LAB — COMPREHENSIVE METABOLIC PANEL
ALT: 26 U/L (ref 0–44)
AST: 34 U/L (ref 15–41)
Albumin: 3.4 g/dL — ABNORMAL LOW (ref 3.5–5.0)
Alkaline Phosphatase: 97 U/L (ref 38–126)
Anion gap: 11 (ref 5–15)
BUN: 16 mg/dL (ref 6–20)
CO2: 30 mmol/L (ref 22–32)
Calcium: 8.9 mg/dL (ref 8.9–10.3)
Chloride: 95 mmol/L — ABNORMAL LOW (ref 98–111)
Creatinine, Ser: 0.64 mg/dL (ref 0.44–1.00)
GFR, Estimated: >60 mL/min (ref >60)
Glucose, Bld: 164 mg/dL — ABNORMAL HIGH (ref 70–99)
Potassium: 4.3 mmol/L (ref 3.5–5.1)
Sodium: 136 mmol/L (ref 135–145)
Total Bilirubin: 1.3 mg/dL — ABNORMAL HIGH (ref 0.3–1.2)
Total Protein: 8 g/dL (ref 6.5–8.1)

## 2020-10-06 LAB — CBC WITH DIFFERENTIAL/PLATELET
Abs Immature Granulocytes: 0.09 10*3/uL — ABNORMAL HIGH (ref 0.00–0.07)
Basophils Absolute: 0 10*3/uL (ref 0.0–0.1)
Basophils Relative: 0 %
Eosinophils Absolute: 0.2 10*3/uL (ref 0.0–0.5)
Eosinophils Relative: 2 %
HCT: 37 % (ref 36.0–46.0)
Hemoglobin: 12.2 g/dL (ref 12.0–15.0)
Immature Granulocytes: 1 %
Lymphocytes Relative: 15 %
Lymphs Abs: 1.4 10*3/uL (ref 0.7–4.0)
MCH: 27.2 pg (ref 26.0–34.0)
MCHC: 33 g/dL (ref 30.0–36.0)
MCV: 82.4 fL (ref 80.0–100.0)
Monocytes Absolute: 0.6 10*3/uL (ref 0.1–1.0)
Monocytes Relative: 7 %
Neutro Abs: 7.2 10*3/uL (ref 1.7–7.7)
Neutrophils Relative %: 75 %
Platelets: 268 10*3/uL (ref 150–400)
RBC: 4.49 MIL/uL (ref 3.87–5.11)
RDW: 14.9 % (ref 11.5–15.5)
WBC: 9.5 10*3/uL (ref 4.0–10.5)
nRBC: 0 % (ref 0.0–0.2)

## 2020-10-06 LAB — RESP PANEL BY RT-PCR (FLU A&B, COVID) ARPGX2
Influenza A by PCR: NEGATIVE
Influenza B by PCR: NEGATIVE
SARS Coronavirus 2 by RT PCR: NEGATIVE

## 2020-10-06 MED ORDER — DOXYCYCLINE MONOHYDRATE 100 MG PO TABS: 100.0000 mg | ORAL_TABLET | Freq: Two times a day (BID) | ORAL | 0 refills | Status: DC

## 2020-10-06 MED ORDER — LEVOFLOXACIN 750 MG PO TABS
750.0000 mg | ORAL_TABLET | Freq: Every day | ORAL | 0 refills | Status: AC
Start: 2020-10-06 — End: 2020-10-13

## 2020-10-06 MED ORDER — TIOTROPIUM BROMIDE MONOHYDRATE 18 MCG IN CAPS: 18.0000 ug | ORAL_CAPSULE | Freq: Every day | RESPIRATORY_TRACT | 12 refills | Status: DC

## 2020-10-06 NOTE — ED Triage Notes (Addendum)
Pt comes with c/o cough and coughing up blood. Pt states she was prescribed inhaler and now was told to stop the new one bc of her coughing up blood. Pt states now she doesn't have what she needs to assist with this cough. Pt states throat is sore due to the cough.  Pt does wear O2 at home 2-3L. [place]. Pt states she tried to contact her PCP for help and has been unable too.

## 2020-10-06 NOTE — Discharge Instructions (Addendum)
Follow-up with Dr. Raul Del if not improving in 2 days.  Return emergency department worsening.  Take the antibiotic as prescribed.  Doxycycline will cause you to burn more easily so avoid sunlight.  The phone number for [facility] medication manages [phone].  If your insurance continues to avoid pain for your inhaler you could discuss options with them.

## 2020-10-06 NOTE — ED Provider Notes (Signed)
[facility] Emergency Department Provider Note  ____________________________________________   Event Date/Time   First MD Initiated Contact with Patient 10/06/20 1111     (approximate)  I have reviewed the triage vital signs and the nursing notes.   HISTORY  Chief Complaint Cough    HPI Stephanie Ross is a 55 y.o. female presents emergency department complaining of productive cough, states she is coughing up blood.  Patient states she also had a fever on Friday and Sunday.  She is not vaccinated for COVID.  No known exposure to COVID.  Patient states she smoked for 45 years.  She has not smoked since April.  She sees Dr. Raul Del and he recently changed her inhaler from Spiriva to a new inhaler because Medicare would not pay for the Spiriva.  She states she does not feel that this new inhaler works as well as the Montz.  She feels more congestion in her lungs.  She denies chest pain.  She denies swelling in the extremities.  Past Medical History:  Diagnosis Date   Asthma    Cancer ([place])    COPD (chronic obstructive pulmonary disease) ([place])    Hypertension     Patient Active Problem List   Diagnosis Date Noted   Pre-op evaluation    Essential hypertension    Aortic valve regurgitation    Acute appendicitis 10/14/2019   CVA (cerebral vascular accident) ([place]) 08/17/2019    Past Surgical History:  Procedure Laterality Date   BILATERAL CARPAL TUNNEL RELEASE     CERVICAL ABLATION     ROTATOR CUFF REPAIR Right     Prior to Admission medications   Medication Sig Start Date End Date Taking? Authorizing Provider  doxycycline (ADOXA) 100 MG tablet Take 1 tablet (100 mg total) by mouth 2 (two) times daily. 10/06/20  Yes Randel Hargens, Linden Dolin, PA-C  tiotropium (SPIRIVA) 18 MCG inhalation capsule Place 1 capsule (18 mcg total) into inhaler and inhale daily. 10/06/20  Yes Con Arganbright, Linden Dolin, PA-C  albuterol (PROVENTIL HFA;VENTOLIN HFA) 108 (90 Base) MCG/ACT  inhaler Inhale 2 puffs into the lungs every 4 (four) hours as needed for wheezing or shortness of breath. 05/07/17   Noemi Chapel, MD  amLODipine (NORVASC) 5 MG tablet Take 5 mg by mouth at bedtime.     [provider]  atorvastatin (LIPITOR) 20 MG tablet Take 20 mg by mouth daily.    [provider]  azithromycin (ZITHROMAX) 250 MG tablet Take 250 (1 pill) daily 10/18/19   [name], MD  budesonide-formoterol [facility]) 160-4.5 MCG/ACT inhaler Inhale 2 puffs into the lungs 2 (two) times daily.    [provider]  calcium carbonate (OS-CAL) 1250 (500 Ca) MG chewable tablet Chew 500 mg by mouth daily.    [provider]  celecoxib (CELEBREX) 200 MG capsule Take 200 mg by mouth daily.    [provider]  cetirizine (ZYRTEC) 10 MG tablet Take 10 mg by mouth daily as needed for allergies.     [provider]  clopidogrel (PLAVIX) 75 MG tablet Take 75 mg by mouth daily. 09/08/19   [provider]  Coenzyme Q10 (COQ10) 100 MG CAPS Take 100 mg by mouth daily.     [provider]  ergocalciferol (VITAMIN D2) 1.25 MG (50000 UT) capsule Take 50,000 Units by mouth once a week.    [provider]  montelukast (SINGULAIR) 10 MG tablet Take 10 mg by mouth at bedtime. 08/02/19   [provider]  nortriptyline (PAMELOR) 10 MG capsule Take 20 mg by mouth at bedtime. 10/06/19   [provider]    Allergies Amoxicillin and Prednisone  Family History  Problem Relation Age of Onset   Breast cancer Sister    Breast cancer Paternal Grandmother     Social History Social History   Tobacco Use   Smoking status: Former    Packs/day: 0.25    Pack years: 0.00    Types: Cigarettes    Quit date: 07/17/2019    Years since quitting: 1.2   Smokeless tobacco: Never  Vaping Use   Vaping Use: Never used  Substance Use Topics   Alcohol use: No   Drug use: Yes    Types: Marijuana    Comment: occasionally     Review of Systems  Constitutional: Positive fever/chills Eyes: No visual changes. ENT: Positive sore throat. Respiratory: Positive cough Cardiovascular: Denies chest pain Gastrointestinal: Denies abdominal pain Genitourinary: Negative for dysuria. Musculoskeletal: Negative for back pain. Skin: Negative for rash. Psychiatric: no mood changes,     ____________________________________________   PHYSICAL EXAM:  VITAL SIGNS: ED Triage Vitals  Enc Vitals Group     BP 10/06/20 1102 113/82     Pulse Rate 10/06/20 1102 (!) 111     Resp 10/06/20 1102 18     Temp 10/06/20 1102 98 F (36.7 C)     Temp src --      SpO2 10/06/20 1102 92 %     Weight --      Height --      Head Circumference --      Peak Flow --      Pain Score 10/06/20 1100 3     Pain Loc --      Pain Edu? --      Excl. in [place]? --     Constitutional: Alert and oriented. Well appearing and in no acute distress. Eyes: Conjunctivae are normal.  Head: Atraumatic. Nose: No congestion/rhinnorhea. Mouth/Throat: Mucous membranes are moist.   Neck:  supple no lymphadenopathy noted Cardiovascular: Normal rate, regular rhythm. Heart sounds are normal Respiratory: Normal respiratory effort.  No retractions, lungs c t a  Abd: soft nontender bs normal all 4 quad GU: deferred Musculoskeletal: FROM all extremities, warm and well perfused Neurologic:  Normal speech and language.  Skin:  Skin is warm, dry and intact. No rash noted. Psychiatric: Mood and affect are normal. Speech and behavior are normal.  ____________________________________________   LABS (all labs ordered are listed, but only abnormal results are displayed)  Labs Reviewed  COMPREHENSIVE METABOLIC PANEL - Abnormal; Notable for the following components:      Result Value   Chloride 95 (*)    Glucose, Bld 164 (*)    Albumin 3.4 (*)    Total Bilirubin 1.3 (*)    All other components within normal limits  CBC WITH DIFFERENTIAL/PLATELET -  Abnormal; Notable for the following components:   Abs Immature Granulocytes 0.09 (*)    All other components within normal limits  RESP PANEL BY RT-PCR (FLU A&B, COVID) [id number]   ____________________________________________   ____________________________________________  RADIOLOGY  Chest x-ray  ____________________________________________   PROCEDURES  Procedure(s) performed: No  Procedures    ____________________________________________   INITIAL IMPRESSION / ASSESSMENT AND PLAN / ED COURSE  Pertinent labs & imaging results that were available during my care of the patient were reviewed by me and considered in my medical decision making (see chart for details).   The patient is a  55 year old female presents with coughing up blood.  See HPI.  Physical exam shows patient appears stable  DDx: COVID, CAP, lung cancer  CBC, metabolic panel, chest x-ray ordered, COVID test ordered  CBC is normal, metabolic panel is normal, respiratory panel is negative for COVID and influenza.  Chest x-ray reviewed by me confirmed by radiology to have a right lower lobe pneumonia.  I did discuss findings with patient.  She is placed on doxycycline.  She is to follow-up with Dr. Raul Del.  Return emergency department worsening.  She was discharged stable condition.    MANAR SMALLING was evaluated in Emergency Department on 10/06/2020 for the symptoms described in the history of present illness. She was evaluated in the context of the global COVID-19 pandemic, which necessitated consideration that the patient might be at risk for infection with the SARS-CoV-2 virus that causes COVID-19. Institutional protocols and algorithms that pertain to the evaluation of patients at risk for COVID-19 are in a state of rapid change based on information released by regulatory bodies including the CDC and federal and state organizations. These policies and algorithms were followed during the patient's care in  the ED.    As part of my medical decision making, I reviewed the following data within the [place] notes reviewed and incorporated, Labs reviewed , Old chart reviewed, Radiograph reviewed , Notes from prior ED visits, and [place] Controlled Substance Database  ____________________________________________   FINAL CLINICAL IMPRESSION(S) / ED DIAGNOSES  Final diagnoses:  Acute pneumonia      NEW MEDICATIONS STARTED DURING THIS VISIT:  New Prescriptions   DOXYCYCLINE (ADOXA) 100 MG TABLET    Take 1 tablet (100 mg total) by mouth 2 (two) times daily.   TIOTROPIUM (SPIRIVA) 18 MCG INHALATION CAPSULE    Place 1 capsule (18 mcg total) into inhaler and inhale daily.     Note:  This document was prepared using Dragon voice recognition software and may include unintentional dictation errors.    Versie Starks, PA-C 10/06/20 1320    Nena Polio, MD 10/06/20 [phone]

## 2020-10-06 NOTE — ED Triage Notes (Signed)
First Nurse Note: C/O coughing up blood.  States she has Stage IV COPD.  Wearing home oxygen.  No SOB/ DOE.  Skin warm and dry. NAD

## 2020-10-19 ENCOUNTER — Other Ambulatory Visit: Payer: Self-pay

## 2020-10-19 ENCOUNTER — Ambulatory Visit
Admission: RE | Admit: 2020-10-19 | Discharge: 2020-10-19 | Disposition: A | Discharge status: Home / Self Care | Payer: Medicare Other | Source: Ambulatory Visit | Attending: Specialist | Admitting: Specialist

## 2020-10-19 DIAGNOSIS — E663 Overweight: Secondary | ICD-10-CM | POA: Diagnosis present

## 2020-10-19 DIAGNOSIS — Z9981 Dependence on supplemental oxygen: Secondary | ICD-10-CM | POA: Insufficient documentation

## 2020-10-19 DIAGNOSIS — R0609 Other forms of dyspnea: Secondary | ICD-10-CM

## 2020-10-19 DIAGNOSIS — R0600 Dyspnea, unspecified: Secondary | ICD-10-CM

## 2020-10-19 DIAGNOSIS — J449 Chronic obstructive pulmonary disease, unspecified: Secondary | ICD-10-CM | POA: Diagnosis present

## 2020-10-26 ENCOUNTER — Other Ambulatory Visit: Payer: Self-pay | Admitting: Neurology

## 2020-10-26 DIAGNOSIS — M7989 Other specified soft tissue disorders: Secondary | ICD-10-CM

## 2020-10-29 ENCOUNTER — Other Ambulatory Visit: Payer: Self-pay

## 2020-10-29 ENCOUNTER — Ambulatory Visit
Admission: RE | Admit: 2020-10-29 | Discharge: 2020-10-29 | Disposition: A | Discharge status: Home / Self Care | Payer: Medicare Other | Source: Ambulatory Visit | Attending: Neurology | Admitting: Neurology

## 2020-10-29 DIAGNOSIS — M7989 Other specified soft tissue disorders: Secondary | ICD-10-CM | POA: Insufficient documentation

## 2020-11-19 ENCOUNTER — Other Ambulatory Visit: Payer: Self-pay

## 2020-11-19 ENCOUNTER — Inpatient Hospital Stay
Admission: EM | Admit: 2020-11-19 | Discharge: 2020-11-21 | DRG: 871 | Disposition: A | Discharge status: Home / Self Care | Payer: Medicare Other | Attending: Internal Medicine | Admitting: Internal Medicine

## 2020-11-19 ENCOUNTER — Emergency Department: Payer: Medicare Other

## 2020-11-19 DIAGNOSIS — I700 Atherosclerosis of aorta: Secondary | ICD-10-CM | POA: Diagnosis present

## 2020-11-19 DIAGNOSIS — J449 Chronic obstructive pulmonary disease, unspecified: Secondary | ICD-10-CM | POA: Diagnosis not present

## 2020-11-19 DIAGNOSIS — Z881 Allergy status to other antibiotic agents status: Secondary | ICD-10-CM | POA: Diagnosis not present

## 2020-11-19 DIAGNOSIS — J180 Bronchopneumonia, unspecified organism: Secondary | ICD-10-CM | POA: Diagnosis present

## 2020-11-19 DIAGNOSIS — J9601 Acute respiratory failure with hypoxia: Secondary | ICD-10-CM

## 2020-11-19 DIAGNOSIS — Z9981 Dependence on supplemental oxygen: Secondary | ICD-10-CM

## 2020-11-19 DIAGNOSIS — J181 Lobar pneumonia, unspecified organism: Secondary | ICD-10-CM | POA: Diagnosis present

## 2020-11-19 DIAGNOSIS — Z66 Do not resuscitate: Secondary | ICD-10-CM | POA: Diagnosis present

## 2020-11-19 DIAGNOSIS — I10 Essential (primary) hypertension: Secondary | ICD-10-CM | POA: Diagnosis present

## 2020-11-19 DIAGNOSIS — Z20822 Contact with and (suspected) exposure to covid-19: Secondary | ICD-10-CM | POA: Diagnosis present

## 2020-11-19 DIAGNOSIS — J440 Chronic obstructive pulmonary disease with acute lower respiratory infection: Secondary | ICD-10-CM | POA: Diagnosis present

## 2020-11-19 DIAGNOSIS — Z888 Allergy status to other drugs, medicaments and biological substances status: Secondary | ICD-10-CM | POA: Diagnosis not present

## 2020-11-19 DIAGNOSIS — A419 Sepsis, unspecified organism: Principal | ICD-10-CM | POA: Diagnosis present

## 2020-11-19 DIAGNOSIS — J9621 Acute and chronic respiratory failure with hypoxia: Secondary | ICD-10-CM | POA: Diagnosis present

## 2020-11-19 DIAGNOSIS — I639 Cerebral infarction, unspecified: Secondary | ICD-10-CM | POA: Diagnosis present

## 2020-11-19 DIAGNOSIS — Z87891 Personal history of nicotine dependence: Secondary | ICD-10-CM

## 2020-11-19 DIAGNOSIS — I351 Nonrheumatic aortic (valve) insufficiency: Secondary | ICD-10-CM | POA: Diagnosis present

## 2020-11-19 DIAGNOSIS — C3492 Malignant neoplasm of unspecified part of left bronchus or lung: Secondary | ICD-10-CM | POA: Insufficient documentation

## 2020-11-19 DIAGNOSIS — Z7951 Long term (current) use of inhaled steroids: Secondary | ICD-10-CM | POA: Diagnosis not present

## 2020-11-19 DIAGNOSIS — Z8673 Personal history of transient ischemic attack (TIA), and cerebral infarction without residual deficits: Secondary | ICD-10-CM | POA: Diagnosis not present

## 2020-11-19 DIAGNOSIS — Z7902 Long term (current) use of antithrombotics/antiplatelets: Secondary | ICD-10-CM | POA: Diagnosis not present

## 2020-11-19 DIAGNOSIS — R0789 Other chest pain: Secondary | ICD-10-CM

## 2020-11-19 DIAGNOSIS — J441 Chronic obstructive pulmonary disease with (acute) exacerbation: Secondary | ICD-10-CM | POA: Diagnosis present

## 2020-11-19 DIAGNOSIS — R918 Other nonspecific abnormal finding of lung field: Secondary | ICD-10-CM | POA: Diagnosis present

## 2020-11-19 DIAGNOSIS — Z79899 Other long term (current) drug therapy: Secondary | ICD-10-CM | POA: Diagnosis not present

## 2020-11-19 DIAGNOSIS — Z8701 Personal history of pneumonia (recurrent): Secondary | ICD-10-CM | POA: Diagnosis not present

## 2020-11-19 DIAGNOSIS — F32A Depression, unspecified: Secondary | ICD-10-CM | POA: Diagnosis present

## 2020-11-19 DIAGNOSIS — E785 Hyperlipidemia, unspecified: Secondary | ICD-10-CM | POA: Diagnosis present

## 2020-11-19 DIAGNOSIS — J189 Pneumonia, unspecified organism: Secondary | ICD-10-CM

## 2020-11-19 DIAGNOSIS — R778 Other specified abnormalities of plasma proteins: Secondary | ICD-10-CM | POA: Diagnosis present

## 2020-11-19 DIAGNOSIS — R0602 Shortness of breath: Secondary | ICD-10-CM

## 2020-11-19 LAB — BLOOD GAS, VENOUS
Acid-Base Excess: 4.8 mmol/L — ABNORMAL HIGH (ref 0.0–2.0)
Bicarbonate: 32.5 mmol/L — ABNORMAL HIGH (ref 20.0–28.0)
O2 Saturation: 68.4 %
Patient temperature: 37
pCO2, Ven: 63 mmHg — ABNORMAL HIGH (ref 44.0–60.0)
pH, Ven: 7.32 (ref 7.250–7.430)
pO2, Ven: 39 mmHg (ref 32.0–45.0)

## 2020-11-19 LAB — HEPATIC FUNCTION PANEL
ALT: 17 U/L (ref 0–44)
AST: 16 U/L (ref 15–41)
Albumin: 3.7 g/dL (ref 3.5–5.0)
Alkaline Phosphatase: 119 U/L (ref 38–126)
Bilirubin, Direct: 0.2 mg/dL (ref 0.0–0.2)
Indirect Bilirubin: 1 mg/dL — ABNORMAL HIGH (ref 0.3–0.9)
Total Bilirubin: 1.2 mg/dL (ref 0.3–1.2)
Total Protein: 8.1 g/dL (ref 6.5–8.1)

## 2020-11-19 LAB — RESP PANEL BY RT-PCR (FLU A&B, COVID) ARPGX2
Influenza A by PCR: NEGATIVE
Influenza B by PCR: NEGATIVE
SARS Coronavirus 2 by RT PCR: NEGATIVE

## 2020-11-19 LAB — TROPONIN I (HIGH SENSITIVITY)
Troponin I (High Sensitivity): 63 ng/L — ABNORMAL HIGH (ref <18)
Troponin I (High Sensitivity): 57 ng/L — ABNORMAL HIGH (ref <18)
Troponin I (High Sensitivity): 54 ng/L — ABNORMAL HIGH (ref <18)
Troponin I (High Sensitivity): 53 ng/L — ABNORMAL HIGH (ref <18)
Troponin I (High Sensitivity): 49 ng/L — ABNORMAL HIGH (ref <18)

## 2020-11-19 LAB — BRAIN NATRIURETIC PEPTIDE
B Natriuretic Peptide: 85.1 pg/mL (ref 0.0–100.0)
B Natriuretic Peptide: 190.5 pg/mL — ABNORMAL HIGH (ref 0.0–100.0)

## 2020-11-19 LAB — CBC
HCT: 38.4 % (ref 36.0–46.0)
Hemoglobin: 12.8 g/dL (ref 12.0–15.0)
MCH: 27.3 pg (ref 26.0–34.0)
MCHC: 33.3 g/dL (ref 30.0–36.0)
MCV: 81.9 fL (ref 80.0–100.0)
Platelets: 291 10*3/uL (ref 150–400)
RBC: 4.69 MIL/uL (ref 3.87–5.11)
RDW: 16.4 % — ABNORMAL HIGH (ref 11.5–15.5)
WBC: 19.5 10*3/uL — ABNORMAL HIGH (ref 4.0–10.5)
nRBC: 0.1 % (ref 0.0–0.2)

## 2020-11-19 LAB — BASIC METABOLIC PANEL
Anion gap: 10 (ref 5–15)
BUN: 12 mg/dL (ref 6–20)
CO2: 31 mmol/L (ref 22–32)
Calcium: 8.7 mg/dL — ABNORMAL LOW (ref 8.9–10.3)
Chloride: 94 mmol/L — ABNORMAL LOW (ref 98–111)
Creatinine, Ser: 0.61 mg/dL (ref 0.44–1.00)
GFR, Estimated: >60 mL/min (ref >60)
Glucose, Bld: 164 mg/dL — ABNORMAL HIGH (ref 70–99)
Potassium: 4 mmol/L (ref 3.5–5.1)
Sodium: 135 mmol/L (ref 135–145)

## 2020-11-19 LAB — LACTIC ACID, PLASMA: Lactic Acid, Venous: 1.2 mmol/L (ref 0.5–1.9)

## 2020-11-19 LAB — PROCALCITONIN: Procalcitonin: 0.14 ng/mL

## 2020-11-19 LAB — HEMOGLOBIN A1C
Hgb A1c MFr Bld: 6.7 % — ABNORMAL HIGH (ref 4.8–5.6)
Mean Plasma Glucose: 145.59 mg/dL

## 2020-11-19 MED ORDER — LEVOFLOXACIN IN D5W 750 MG/150ML IV SOLN: 750.0000 mg | Freq: Once | INTRAVENOUS | Status: DC

## 2020-11-19 MED ORDER — COQ10 100 MG PO CAPS: 100.0000 mg | ORAL_CAPSULE | Freq: Every day | ORAL | Status: DC

## 2020-11-19 MED ORDER — ROFLUMILAST 500 MCG PO TABS
500.0000 ug | ORAL_TABLET | Freq: Every day | ORAL | Status: DC
  Administered 2020-11-19 – 2020-11-21 (×3): 500 ug via ORAL
  Filled 2020-11-19 (×3): qty 1

## 2020-11-19 MED ORDER — HYDRALAZINE HCL 20 MG/ML IJ SOLN: 5.0000 mg | INTRAMUSCULAR | Status: DC | PRN

## 2020-11-19 MED ORDER — SODIUM CHLORIDE 0.9 % IV BOLUS
1000.0000 mL | Freq: Once | INTRAVENOUS | Status: AC
  Administered 2020-11-19: 1000 mL via INTRAVENOUS

## 2020-11-19 MED ORDER — MOMETASONE FURO-FORMOTEROL FUM 200-5 MCG/ACT IN AERO
2.0000 | INHALATION_SPRAY | Freq: Two times a day (BID) | RESPIRATORY_TRACT | Status: DC
  Administered 2020-11-19 – 2020-11-21 (×4): 2 via RESPIRATORY_TRACT
  Filled 2020-11-19: qty 8.8

## 2020-11-19 MED ORDER — DM-GUAIFENESIN ER 30-600 MG PO TB12: 1.0000 | ORAL_TABLET | Freq: Two times a day (BID) | ORAL | Status: DC | PRN

## 2020-11-19 MED ORDER — MORPHINE SULFATE (PF) 2 MG/ML IV SOLN
2.0000 mg | INTRAVENOUS | Status: DC | PRN
Start: 2020-11-19 — End: 2020-11-20
  Administered 2020-11-19: 2 mg via INTRAVENOUS
  Filled 2020-11-19: qty 1

## 2020-11-19 MED ORDER — SODIUM CHLORIDE 0.9 % IV BOLUS
500.0000 mL | Freq: Once | INTRAVENOUS | Status: DC
Start: 2020-11-19 — End: 2020-11-21

## 2020-11-19 MED ORDER — VANCOMYCIN HCL 1250 MG/250ML IV SOLN
1250.0000 mg | Freq: Once | INTRAVENOUS | Status: DC
  Filled 2020-11-19: qty 250

## 2020-11-19 MED ORDER — MONTELUKAST SODIUM 10 MG PO TABS
10.0000 mg | ORAL_TABLET | Freq: Every day | ORAL | Status: DC
  Administered 2020-11-19 – 2020-11-20 (×2): 10 mg via ORAL
  Filled 2020-11-19 (×2): qty 1

## 2020-11-19 MED ORDER — SODIUM CHLORIDE 0.9 % IV SOLN: INTRAVENOUS | Status: DC

## 2020-11-19 MED ORDER — METRONIDAZOLE 500 MG/100ML IV SOLN
500.0000 mg | Freq: Once | INTRAVENOUS | Status: AC
  Administered 2020-11-19: 500 mg via INTRAVENOUS
  Filled 2020-11-19: qty 100

## 2020-11-19 MED ORDER — BACLOFEN 10 MG PO TABS
10.0000 mg | ORAL_TABLET | Freq: Three times a day (TID) | ORAL | Status: DC | PRN
  Filled 2020-11-19: qty 1

## 2020-11-19 MED ORDER — ACETAMINOPHEN 325 MG PO TABS
650.0000 mg | ORAL_TABLET | Freq: Four times a day (QID) | ORAL | Status: DC | PRN
  Administered 2020-11-19: 650 mg via ORAL
  Filled 2020-11-19 (×2): qty 2

## 2020-11-19 MED ORDER — VANCOMYCIN HCL 1500 MG/300ML IV SOLN
1500.0000 mg | INTRAVENOUS | Status: DC
  Filled 2020-11-19: qty 300

## 2020-11-19 MED ORDER — HEPARIN SODIUM (PORCINE) 5000 UNIT/ML IJ SOLN
5000.0000 [IU] | Freq: Three times a day (TID) | INTRAMUSCULAR | Status: DC
  Administered 2020-11-19 – 2020-11-21 (×5): 5000 [IU] via SUBCUTANEOUS
  Filled 2020-11-19 (×5): qty 1

## 2020-11-19 MED ORDER — SODIUM CHLORIDE 0.9 % IV SOLN
2.0000 g | Freq: Once | INTRAVENOUS | Status: AC
  Administered 2020-11-19: 2 g via INTRAVENOUS
  Filled 2020-11-19: qty 2

## 2020-11-19 MED ORDER — ASPIRIN EC 81 MG PO TBEC
81.0000 mg | DELAYED_RELEASE_TABLET | Freq: Every day | ORAL | Status: DC
  Administered 2020-11-19 – 2020-11-21 (×3): 81 mg via ORAL
  Filled 2020-11-19 (×3): qty 1

## 2020-11-19 MED ORDER — IOHEXOL 350 MG/ML SOLN
75.0000 mL | Freq: Once | INTRAVENOUS | Status: AC | PRN
  Administered 2020-11-19: 75 mL via INTRAVENOUS

## 2020-11-19 MED ORDER — VANCOMYCIN HCL 2000 MG/400ML IV SOLN
2000.0000 mg | Freq: Once | INTRAVENOUS | Status: AC
  Administered 2020-11-19: 2000 mg via INTRAVENOUS
  Filled 2020-11-19: qty 400

## 2020-11-19 MED ORDER — ALBUTEROL SULFATE (2.5 MG/3ML) 0.083% IN NEBU: 2.5000 mg | INHALATION_SOLUTION | RESPIRATORY_TRACT | Status: DC | PRN

## 2020-11-19 MED ORDER — LORATADINE 10 MG PO TABS
10.0000 mg | ORAL_TABLET | Freq: Every day | ORAL | Status: DC
  Administered 2020-11-19 – 2020-11-21 (×3): 10 mg via ORAL
  Filled 2020-11-19 (×3): qty 1

## 2020-11-19 MED ORDER — VANCOMYCIN HCL IN DEXTROSE 1-5 GM/200ML-% IV SOLN: 1000.0000 mg | Freq: Once | INTRAVENOUS | Status: DC

## 2020-11-19 MED ORDER — CALCIUM CARBONATE 1250 (500 CA) MG PO TABS
1250.0000 mg | ORAL_TABLET | Freq: Every day | ORAL | Status: DC
  Administered 2020-11-19 – 2020-11-21 (×3): 1250 mg via ORAL
  Filled 2020-11-19 (×3): qty 1

## 2020-11-19 MED ORDER — FENTANYL CITRATE PF 50 MCG/ML IJ SOSY
50.0000 ug | PREFILLED_SYRINGE | Freq: Once | INTRAMUSCULAR | Status: AC
  Administered 2020-11-19: 50 ug via INTRAVENOUS
  Filled 2020-11-19: qty 1

## 2020-11-19 MED ORDER — ONDANSETRON HCL 4 MG/2ML IJ SOLN
4.0000 mg | Freq: Once | INTRAMUSCULAR | Status: AC
  Administered 2020-11-19: 4 mg via INTRAVENOUS
  Filled 2020-11-19: qty 2

## 2020-11-19 MED ORDER — NORTRIPTYLINE HCL 10 MG PO CAPS
20.0000 mg | ORAL_CAPSULE | Freq: Every day | ORAL | Status: DC
  Administered 2020-11-19: 20 mg via ORAL
  Filled 2020-11-19 (×2): qty 2

## 2020-11-19 MED ORDER — ATORVASTATIN CALCIUM 20 MG PO TABS
20.0000 mg | ORAL_TABLET | Freq: Every day | ORAL | Status: DC
  Administered 2020-11-19 – 2020-11-21 (×3): 20 mg via ORAL
  Filled 2020-11-19 (×3): qty 1

## 2020-11-19 MED ORDER — ONDANSETRON HCL 4 MG/2ML IJ SOLN
4.0000 mg | Freq: Three times a day (TID) | INTRAMUSCULAR | Status: DC | PRN
  Administered 2020-11-21: 4 mg via INTRAVENOUS
  Filled 2020-11-19: qty 2

## 2020-11-19 MED ORDER — SODIUM CHLORIDE 0.9 % IV SOLN
2.0000 g | Freq: Three times a day (TID) | INTRAVENOUS | Status: DC
  Administered 2020-11-19 – 2020-11-20 (×2): 2 g via INTRAVENOUS
  Filled 2020-11-19 (×4): qty 2

## 2020-11-19 MED ORDER — IPRATROPIUM-ALBUTEROL 0.5-2.5 (3) MG/3ML IN SOLN
3.0000 mL | RESPIRATORY_TRACT | Status: DC
  Administered 2020-11-19 (×2): 3 mL via RESPIRATORY_TRACT
  Filled 2020-11-19 (×2): qty 3

## 2020-11-19 MED ORDER — OXYCODONE-ACETAMINOPHEN 5-325 MG PO TABS
1.0000 | ORAL_TABLET | ORAL | Status: DC | PRN
  Administered 2020-11-19 – 2020-11-20 (×5): 1 via ORAL
  Filled 2020-11-19 (×5): qty 1

## 2020-11-19 NOTE — Consult Note (Addendum)
Pharmacy Antibiotic Note  Stephanie Ross is a 55 y.o. female admitted on 11/19/2020 with pneumonia in setting of possible bronchial carcinoma.  Pharmacy has been consulted for azactam & vanco dosing.  Plan: 54 y.o. female with COPD on 2 L of oxygen who comes in with chest pain and shortness of breath.  Patient states that she was recently treated for pneumonia back in July.  She completed her course of antibiotics since that she started feeling better.  Until last night she developed right-sided chest pain that was constant, worse with taking a deep breath, nothing makes it better.  She states that then she started feeling more short of breath, constant  Height: 5' 2.5" (158.8 cm) Weight: 98.9 kg (218 lb) IBW/kg (Calculated) : 51.25  Temp (24hrs), Avg:99.6 F (37.6 C), Min:99.6 F (37.6 C), Max:99.6 F (37.6 C)  Recent Labs  Lab 11/19/20 1040 11/19/20 1041  WBC 19.5*  --   CREATININE 0.61  --   LATICACIDVEN  --  1.2    Estimated Creatinine Clearance: 87.1 mL/min (by C-G formula based on SCr of 0.61 mg/dL).    Allergies  Allergen Reactions   Amoxicillin Other (See Comments)    Has patient had a PCN reaction causing immediate rash, facial/tongue/throat swelling, SOB or lightheadedness with hypotension: Yes Has patient had a PCN reaction causing severe rash involving mucus membranes or skin necrosis: Yes Has patient had a PCN reaction that required hospitalization:yes Has patient had a PCN reaction occurring within the last 10 years: yes If all of the above answers are "NO", then may proceed with Cephalosporin use.    Prednisone Other (See Comments)    Full body rash    Doxycycline Other (See Comments)    Other reaction(s): Other (See Comments) "caused fluid in lungs" "caused fluid in lungs"    Metoprolol     Other reaction(s): Dizziness    Antimicrobials this admission: Aztreonam 2gms  >> in ED Vanco 2000mg  LD  >>   Inpt  Therapy: Aztreonam 2gms q8hrs Vancomycin  1500mg  q24hrs AUC: 531  Cmax/min=40.7/11.2  Microbiology results: 08/26 BCx: pending  08/26 MRSA PCR: pending  Thank you for allowing pharmacy to be a part of this patient's care.  Stephanie Ross 11/19/2020 3:41 PM

## 2020-11-19 NOTE — Consult Note (Signed)
CODE SEPSIS - PHARMACY COMMUNICATION  **Broad Spectrum Antibiotics should be administered within 1 hour of Sepsis diagnosis**  Time Code Sepsis Called/Page Received: 1341  Antibiotics Ordered: Azactam 2gms & Vanco  Time of 1st antibiotic administration: 1429  Additional action taken by pharmacy: message to nurse @1427   If necessary, Name of Provider/Nurse Contacted: Domingo Pulse ,PharmD Clinical Pharmacist  11/19/2020  2:37 PM

## 2020-11-19 NOTE — ED Notes (Signed)
Dr. Blaine Hamper contacted about patient's sats at 88-89% on 5L. Patient denies shortness of breath, states that is her baseline at home. RR 19-20.

## 2020-11-19 NOTE — Consult Note (Signed)
[facility]  [phone] [phone]  ID: ALEYSSA PIKE OB: 19-May-1965  MR#: [id number]  EUM#:[id number]  Patient Care Team: [facility] as PCP - General  CHIEF COMPLAINT: Left lower lobe lung mass.  INTERVAL HISTORY: Patient is a 55 year old female who presented to the emergency room with worsening shortness of breath, chest tightness, and right-sided chest pain.  Work-up included CT scan which did not reveal PE but did redemonstrate a 3.6 cm mass in her left lower lobe that is unchanged from 1 month prior.  Patient feels improved, but not back to her baseline.  She has no neurologic complaints.  She denies any recent fevers or illnesses.  She has a good appetite and denies weight loss.  She denies any hemoptysis.  She has no nausea, vomiting, constipation, or diarrhea.  She has no urinary complaints.  Patient offers no further specific complaints today.  REVIEW OF SYSTEMS:   Review of Systems  Constitutional: Negative.  Negative for fever, malaise/fatigue and weight loss.  Respiratory:  Positive for cough and shortness of breath. Negative for hemoptysis.   Cardiovascular:  Positive for chest pain. Negative for leg swelling.  Gastrointestinal:  Negative for abdominal pain.  Genitourinary: Negative.  Negative for dysuria.  Musculoskeletal: Negative.  Negative for back pain.  Skin: Negative.  Negative for rash.  Neurological: Negative.  Negative for dizziness, focal weakness, weakness and headaches.  Psychiatric/Behavioral: Negative.  The patient is not nervous/anxious.    As per HPI. Otherwise, a complete review of systems is negative.  PAST MEDICAL HISTORY: Past Medical History:  Diagnosis Date   Asthma    Cancer ([place])    COPD (chronic obstructive pulmonary disease) ([place])    Hypertension     PAST SURGICAL HISTORY: Past Surgical History:  Procedure Laterality Date   BILATERAL CARPAL TUNNEL RELEASE      CERVICAL ABLATION     ROTATOR CUFF REPAIR Right     FAMILY HISTORY: Family History  Problem Relation Age of Onset   Breast cancer Sister    Breast cancer Paternal Grandmother     ADVANCED DIRECTIVES (Y/N):  @ADVDIR @  HEALTH MAINTENANCE: Social History   Tobacco Use   Smoking status: Former    Packs/day: 0.25    Types: Cigarettes    Quit date: 07/17/2019    Years since quitting: 1.3   Smokeless tobacco: Never  Vaping Use   Vaping Use: Never used  Substance Use Topics   Alcohol use: No   Drug use: Yes    Types: Marijuana    Comment: occasionally     Colonoscopy:  PAP:  Bone density:  Lipid panel:  Allergies  Allergen Reactions   Amoxicillin Other (See Comments)    Has patient had a PCN reaction causing immediate rash, facial/tongue/throat swelling, SOB or lightheadedness with hypotension: Yes Has patient had a PCN reaction causing severe rash involving mucus membranes or skin necrosis: Yes Has patient had a PCN reaction that required hospitalization:yes Has patient had a PCN reaction occurring within the last 10 years: yes If all of the above answers are "NO", then may proceed with Cephalosporin use.    Prednisone Other (See Comments)    Full body rash    Doxycycline Other (See Comments)    Other reaction(s): Other (See Comments) "caused fluid in lungs" "caused fluid in lungs"    Metoprolol     Other reaction(s): Dizziness    Current Facility-Administered Medications  Medication Dose Route Frequency Provider Last  Rate Last Admin   0.9 %  sodium chloride infusion   Intravenous Continuous Ivor Costa, MD       acetaminophen (TYLENOL) tablet 650 mg  650 mg Oral Q6H PRN Ivor Costa, MD       albuterol (PROVENTIL) (2.5 MG/3ML) 0.083% nebulizer solution 2.5 mg  2.5 mg Nebulization Q4H PRN Ivor Costa, MD       aspirin EC tablet 81 mg  81 mg Oral Daily Ivor Costa, MD       aztreonam (AZACTAM) 2 g in sodium chloride 0.9 % 100 mL IVPB  2 g Intravenous Q8H Berta Minor, RPH       dextromethorphan-guaiFENesin ([place] DM) 30-600 MG per 12 hr tablet 1 tablet  1 tablet Oral BID PRN Ivor Costa, MD       hydrALAZINE (APRESOLINE) injection 5 mg  5 mg Intravenous Q2H PRN Ivor Costa, MD       ipratropium-albuterol (DUONEB) 0.5-2.5 (3) MG/3ML nebulizer solution 3 mL  3 mL Nebulization Q4H Ivor Costa, MD   3 mL at 11/19/20 1615   morphine 2 MG/ML injection 2 mg  2 mg Intravenous Q4H PRN Ivor Costa, MD   2 mg at 11/19/20 1434   ondansetron (ZOFRAN) injection 4 mg  4 mg Intravenous Q8H PRN Ivor Costa, MD       oxyCODONE-acetaminophen (PERCOCET/ROXICET) 5-325 MG per tablet 1 tablet  1 tablet Oral Q4H PRN Ivor Costa, MD       sodium chloride 0.9 % bolus 500 mL  500 mL Intravenous Once Ivor Costa, MD       vancomycin (VANCOCIN) IVPB 1000 mg/200 mL premix  1,000 mg Intravenous Once Berta Minor [facility]       And   vancomycin (VANCOREADY) IVPB 1250 mg/250 mL  1,250 mg Intravenous Once Berta Minor, [facility]       Current Outpatient Medications  Medication Sig Dispense Refill   albuterol (PROVENTIL HFA;VENTOLIN HFA) 108 (90 Base) MCG/ACT inhaler Inhale 2 puffs into the lungs every 4 (four) hours as needed for wheezing or shortness of breath. 1 Inhaler 3   amLODipine (NORVASC) 5 MG tablet Take 5 mg by mouth at bedtime.      atorvastatin (LIPITOR) 20 MG tablet Take 20 mg by mouth daily.     baclofen (LIORESAL) 10 MG tablet Take 10 mg by mouth 3 (three) times daily.     budesonide-formoterol (SYMBICORT) 160-4.5 MCG/ACT inhaler Inhale 2 puffs into the lungs 2 (two) times daily.     calcium carbonate (OS-CAL) 1250 (500 Ca) MG chewable tablet Chew 500 mg by mouth daily.     celecoxib (CELEBREX) 200 MG capsule Take 200 mg by mouth daily.     cetirizine (ZYRTEC) 10 MG tablet Take 10 mg by mouth daily as needed for allergies.      clopidogrel (PLAVIX) 75 MG tablet Take 75 mg by mouth daily.     Coenzyme Q10 (COQ10) 100 MG CAPS Take 100 mg by mouth daily.      DALIRESP 500 MCG TABS  tablet Take 500 mcg by mouth daily.     ergocalciferol (VITAMIN D2) 1.25 MG (50000 UT) capsule Take 50,000 Units by mouth every Monday.     furosemide (LASIX) 20 MG tablet Take 20 mg by mouth daily.     montelukast (SINGULAIR) 10 MG tablet Take 10 mg by mouth at bedtime.     nortriptyline (PAMELOR) 10 MG capsule Take 20 mg by mouth daily.     tiotropium (  SPIRIVA) 18 MCG inhalation capsule Place 1 capsule (18 mcg total) into inhaler and inhale daily. 30 capsule 12   azithromycin (ZITHROMAX) 250 MG tablet Take 250 (1 pill) daily (Patient not taking: Reported on 11/19/2020) 5 each 0   doxycycline (ADOXA) 100 MG tablet Take 1 tablet (100 mg total) by mouth 2 (two) times daily. (Patient not taking: No sig reported) 20 tablet 0   INCRUSE ELLIPTA 62.5 MCG/INH AEPB Inhale 1 puff into the lungs daily. (Patient not taking: Reported on 11/19/2020)      OBJECTIVE: Vitals:   11/19/20 1600 11/19/20 1715  BP: 113/64 110/61  Pulse: (!) 118 (!) 113  Resp: (!) 21 20  Temp:    SpO2: 94% 100%     Body mass index is 39.24 kg/m.    ECOG FS:1 - Symptomatic but completely ambulatory  General: Well-developed, well-nourished, no acute distress. Eyes: Pink conjunctiva, anicteric sclera. HEENT: Normocephalic, moist mucous membranes. Lungs: No audible wheezing or coughing. Heart: Regular rate and rhythm. Abdomen: Soft, nontender, no obvious distention. Musculoskeletal: No edema, cyanosis, or clubbing. Neuro: Alert, answering all questions appropriately. Cranial nerves grossly intact. Skin: No rashes or petechiae noted. Psych: Normal affect. Lymphatics: No cervical, calvicular, axillary or inguinal LAD.   LAB RESULTS:  Lab Results  Component Value Date   NA 135 11/19/2020   K 4.0 11/19/2020   CL 94 (L) 11/19/2020   CO2 31 11/19/2020   GLUCOSE 164 (H) 11/19/2020   BUN 12 11/19/2020   CREATININE 0.61 11/19/2020   CALCIUM 8.7 (L) 11/19/2020   PROT 8.1 11/19/2020   ALBUMIN 3.7 11/19/2020   AST 16  11/19/2020   ALT 17 11/19/2020   ALKPHOS 119 11/19/2020   BILITOT 1.2 11/19/2020   GFRNONAA >60 11/19/2020   GFRAA >60 10/15/2019    Lab Results  Component Value Date   WBC 19.5 (H) 11/19/2020   NEUTROABS 7.2 10/06/2020   HGB 12.8 11/19/2020   HCT 38.4 11/19/2020   MCV 81.9 11/19/2020   PLT 291 11/19/2020     STUDIES: CT Angio Chest PE W and/or Wo Contrast  Result Date: 11/19/2020 CLINICAL DATA:  Pain in the right lateral chest, shortness of breath EXAM: CT ANGIOGRAPHY CHEST WITH CONTRAST TECHNIQUE: Multidetector CT imaging of the chest was performed using the standard protocol during bolus administration of intravenous contrast. Multiplanar CT image reconstructions and MIPs were obtained to evaluate the vascular anatomy. CONTRAST:  66mL OMNIPAQUE IOHEXOL 350 MG/ML SOLN COMPARISON:  10/19/2020 FINDINGS: Cardiovascular: Satisfactory opacification of the pulmonary arteries to the segmental level. No evidence of pulmonary embolism. Normal heart size. No pericardial effusion. Coronary artery and aortic calcifications. Mediastinum/Nodes: Debris in the trachea and both mainstem bronchi, with evidence of distal mucus plugging. No enlarged mediastinal, hilar, or axillary lymph nodes. Thyroid gland and esophagus demonstrate no significant findings. Lungs/Pleura: Redemonstrated irregular, thick-walled mass in the superior left lower lobe, which measures up to 3.2 x 2.7 x 2.6 cm (series 5, image 151 and series 9, image 58), unchanged compared to the prior exam. Centrilobular emphysema. Mild peribronchial thickening. Peribronchovascular consolidation nodularity in the right middle lobe, which is increased compared to 10/19/2020. Similar consolidation is also noted in the right greater than left dependent lower lobes. No pleural effusion. Upper Abdomen: No acute abnormality. Musculoskeletal: No chest wall abnormality. No acute or significant osseous findings. Review of the MIP images confirms the above  findings. IMPRESSION: 1. Negative for pulmonary embolism. 2. Redemonstrated cavitary lesion in the superior left lower lobe, which remains concerning  for bronchogenic carcinoma. 3. Increased consolidation in the right middle lobe, with additional similar consolidative opacities in the right greater than left dependent lower lobes. This may be infectious or inflammatory. 4. Aortic Atherosclerosis (ICD10-I70.0) and Emphysema (ICD10-J43.9). Coronary atherosclerosis. Electronically Signed   By: Merilyn Baba M.D.   On: 11/19/2020 13:17   US Venous Img Lower Unilateral Left (DVT)  Result Date: 10/29/2020 CLINICAL DATA:  Left lower extremity edema. EXAM: LEFT LOWER EXTREMITY VENOUS DOPPLER ULTRASOUND TECHNIQUE: Gray-scale sonography with graded compression, as well as color Doppler and duplex ultrasound were performed to evaluate the lower extremity deep venous systems from the level of the common femoral vein and including the common femoral, femoral, profunda femoral, popliteal and calf veins including the posterior tibial, peroneal and gastrocnemius veins when visible. The superficial great saphenous vein was also interrogated. Spectral Doppler was utilized to evaluate flow at rest and with distal augmentation maneuvers in the common femoral, femoral and popliteal veins. COMPARISON:  None. FINDINGS: Contralateral Common Femoral Vein: Respiratory phasicity is normal and symmetric with the symptomatic side. No evidence of thrombus. Normal compressibility. Common Femoral Vein: No evidence of thrombus. Normal compressibility, respiratory phasicity and response to augmentation. Saphenofemoral Junction: No evidence of thrombus. Normal compressibility and flow on color Doppler imaging. Profunda Femoral Vein: No evidence of thrombus. Normal compressibility and flow on color Doppler imaging. Femoral Vein: No evidence of thrombus. Normal compressibility, respiratory phasicity and response to augmentation. Popliteal Vein: No  evidence of thrombus. Normal compressibility, respiratory phasicity and response to augmentation. Calf Veins: No evidence of thrombus. Normal compressibility and flow on color Doppler imaging. Superficial Great Saphenous Vein: No evidence of thrombus. Normal compressibility. Venous Reflux:  None. Other Findings: No evidence of superficial thrombophlebitis or abnormal fluid collection. IMPRESSION: No evidence of left lower extremity deep venous thrombosis. Electronically Signed   By: Aletta Edouard M.D.   On: 10/29/2020 11:25   DG Chest Port 1 View  Result Date: 11/19/2020 CLINICAL DATA:  55 year old female with right lateral chest pain and increased shortness of breath since yesterday. EXAM: PORTABLE CHEST 1 VIEW COMPARISON:  Chest CT 10/19/2020 and earlier. FINDINGS: Portable AP semi upright view at 1013 hours. Emphysema demonstrated by CT last month. Unresolved and mildly progressed reticulonodular and patchy opacity at the right lung base, involving the right middle lobe on the prior CT. Left lower lobe superior segment cavitary lesion remains occult by portable chest. Streaky left lung base opacity has mildly increased. Stable cardiac size and mediastinal contours. Visualized tracheal air column is within normal limits. No pneumothorax or pleural effusion. IMPRESSION: 1. Unresolved and mildly progressed right middle lobe airspace disease since the CT last month. Bronchopneumonia was favored at that time. 2. Cavitary lesion of the superior segment left lower lobe is occult by portable chest - please see prior CT report. 3. Emphysema. Electronically Signed   By: Genevie Ann M.D.   On: 11/19/2020 10:42    ASSESSMENT: Left lower lobe lung mass.  PLAN:    Left lower lobe lung mass: Highly suspicious for underlying malignancy.  Mass essentially unchanged from CT scan 1 month ago.  Patient is not a surgical candidate and given her underlying cardiac and pulmonary disease, may not be able to tolerate biopsy  either.  She did agree to further work-up as an outpatient and can follow-up in the [place] upon discharge.  Will get a PET scan as an outpatient and discuss with radiation oncology if treatment is possible without firm diagnosis.  Will  arrange follow-up upon discharge.  Appreciate consult, call with questions.   Lloyd Huger, MD   11/19/2020 5:56 PM

## 2020-11-19 NOTE — Progress Notes (Signed)
[facility] following Code Sepsis. 

## 2020-11-19 NOTE — ED Notes (Signed)
Patient given blanket. Family at bedside. Fluids going in at bolus rate. NAD.

## 2020-11-19 NOTE — H&P (Addendum)
History and Physical    Stephanie Ross:[id number] DOB: [birth year] DOA: 11/19/2020  Referring MD/NP/PA:   PCP: [facility]   Patient coming from:  The patient is coming from home.  At baseline, pt is independent for most of ADL.        Chief Complaint: SOB  HPI: Stephanie Ross is a 55 y.o. female with medical history significant of hypertension, hyperlipidemia, COPD on 2 L oxygen, asthma, stroke, pressure, aortic valve regurgitation, known lung mass, who presents with shortness breath.  Patient states that she has severe COPD and chronic shortness breath, which has progressively worsened since yesterday.  She has cough with little yellow sputum production.  No chest pain, fever or chills.  She is using 2 L oxygen, found to have oxygen desaturated to 88% on home level 2 L oxygen, oxygen was increased to 5 to 6 L in order to reach 90% of saturation in ED. Patient denies nausea vomiting, diarrhea or abdominal pain.  No symptoms of UTI.  Of note, patient has known lung mass.  CT scan on 10/19/2020 showed an irregular thick-walled cavitary mass in the left lower lobe, which is highly worrisome for primary bronchogenic carcinoma. Pt was seen by Dr.Fleming of pulmonology on 10/26/2020, no treatment started yet.  ED Course: pt was found to have WBC 19.5, troponin level 63, 57, BNP 58.1, lactic acid 1.2, INR 1.2, negative COVID PCR, electrolytes renal function okay, VBG with a pH of 7.32, CO2 63, O2 39. Temperature 99.6, soft blood pressure, tachycardia with heart rate of 120-130s, RR 28.  Chest x-ray showed pneumonia in the right middle lobe.  Patient is admitted to progressive bed as inpatient.  Dr. Grayland Ormond of oncology and Dr. Raul Del of pulmonology are consulted.  CTA of chest showed: 1. Negative for pulmonary embolism. 2. Redemonstrated cavitary lesion in the superior left lower lobe, which remains concerning for bronchogenic carcinoma. 3. Increased  consolidation in the right middle lobe, with additional similar consolidative opacities in the right greater than left dependent lower lobes. This may be infectious or inflammatory. 4. Aortic Atherosclerosis   Review of Systems:   General: no fevers, chills, no body weight gain, has fatigue HEENT: no blurry vision, hearing changes or sore throat Respiratory: has dyspnea, coughing, wheezing CV: no chest pain, no palpitations GI: no nausea, vomiting, abdominal pain, diarrhea, constipation GU: no dysuria, burning on urination, increased urinary frequency, hematuria  Ext: no leg edema Neuro: no unilateral weakness, numbness, or tingling, no vision change or hearing loss Skin: no rash, no skin tear. MSK: No muscle spasm, no deformity, no limitation of range of movement in spin Heme: No easy bruising.  Travel history: No recent long distant travel.  Allergy:  Allergies  Allergen Reactions   Amoxicillin Other (See Comments)    Has patient had a PCN reaction causing immediate rash, facial/tongue/throat swelling, SOB or lightheadedness with hypotension: Yes Has patient had a PCN reaction causing severe rash involving mucus membranes or skin necrosis: Yes Has patient had a PCN reaction that required hospitalization:yes Has patient had a PCN reaction occurring within the last 10 years: yes If all of the above answers are "NO", then may proceed with Cephalosporin use.    Prednisone Other (See Comments)    Full body rash    Doxycycline Other (See Comments)    Other reaction(s): Other (See Comments) "caused fluid in lungs" "caused fluid in lungs"    Metoprolol     Other reaction(s):  Dizziness    Past Medical History:  Diagnosis Date   Asthma    Cancer ([place])    COPD (chronic obstructive pulmonary disease) ([place])    Hypertension     Past Surgical History:  Procedure Laterality Date   BILATERAL CARPAL TUNNEL RELEASE     CERVICAL ABLATION     ROTATOR CUFF REPAIR Right     Social  History:  reports that she quit smoking about 16 months ago. She smoked an average of .25 packs per day. She has never used smokeless tobacco. She reports current drug use. Drug: Marijuana. She reports that she does not drink alcohol.  Family History:  Family History  Problem Relation Age of Onset   Breast cancer Sister    Breast cancer Paternal Grandmother      Prior to Admission medications   Medication Sig Start Date End Date Taking? Authorizing Provider  albuterol (PROVENTIL HFA;VENTOLIN HFA) 108 (90 Base) MCG/ACT inhaler Inhale 2 puffs into the lungs every 4 (four) hours as needed for wheezing or shortness of breath. 05/07/17   Noemi Chapel, MD  amLODipine (NORVASC) 5 MG tablet Take 5 mg by mouth at bedtime.     [provider]  atorvastatin (LIPITOR) 20 MG tablet Take 20 mg by mouth daily.    [provider]  azithromycin (ZITHROMAX) 250 MG tablet Take 250 (1 pill) daily 10/18/19   [name], MD  budesonide-formoterol [facility]) 160-4.5 MCG/ACT inhaler Inhale 2 puffs into the lungs 2 (two) times daily.    [provider]  calcium carbonate (OS-CAL) 1250 (500 Ca) MG chewable tablet Chew 500 mg by mouth daily.    [provider]  celecoxib (CELEBREX) 200 MG capsule Take 200 mg by mouth daily.    [provider]  cetirizine (ZYRTEC) 10 MG tablet Take 10 mg by mouth daily as needed for allergies.     [provider]  clopidogrel (PLAVIX) 75 MG tablet Take 75 mg by mouth daily. 09/08/19   [provider]  Coenzyme Q10 (COQ10) 100 MG CAPS Take 100 mg by mouth daily.     [provider]  doxycycline (ADOXA) 100 MG tablet Take 1 tablet (100 mg total) by mouth 2 (two) times daily. 10/06/20   Fisher, Linden Dolin, PA-C  ergocalciferol (VITAMIN D2) 1.25 MG (50000 UT) capsule Take 50,000 Units by mouth once a week.    [provider]  montelukast (SINGULAIR) 10 MG tablet Take 10 mg by mouth at bedtime. 08/02/19    [provider]  nortriptyline (PAMELOR) 10 MG capsule Take 20 mg by mouth at bedtime. 10/06/19   [provider]  tiotropium (SPIRIVA) 18 MCG inhalation capsule Place 1 capsule (18 mcg total) into inhaler and inhale daily. 10/06/20   Versie Starks, PA-C    Physical Exam: Vitals:   11/19/20 1530 11/19/20 1600 11/19/20 1715 11/19/20 1839  BP: 114/64 113/64 110/61 104/66  Pulse: (!) 118 (!) 118 (!) 113 (!) 117  Resp: 18 (!) 21 20 18   Temp:    (!) 100.8 F (38.2 C)  TempSrc:      SpO2: 95% 94% 100% 96%  Weight:      Height:       General: Not in acute distress HEENT:       Eyes: PERRL, EOMI, no scleral icterus.       ENT: No discharge from the ears and nose, no pharynx injection, no tonsillar enlargement.        Neck: No JVD,  no bruit, no mass felt. Heme: No neck lymph node enlargement. Cardiac: S1/S2, RRR, No murmurs, No gallops or rubs. Respiratory: Severely decreased air movement bilaterally, mild wheezing bilaterally GI: Soft, nondistended, nontender, no rebound pain, no organomegaly, BS present. GU: No hematuria Ext: No pitting leg edema bilaterally. 1+DP/PT pulse bilaterally. Musculoskeletal: No joint deformities, No joint redness or warmth, no limitation of ROM in spin. Skin: No rashes.  Neuro: Alert, oriented X3, cranial nerves II-XII grossly intact, moves all extremities normally.  Psych: Patient is not psychotic, no suicidal or hemocidal ideation.  Labs on Admission: I have personally reviewed following labs and imaging studies  CBC: Recent Labs  Lab 11/19/20 1040  WBC 19.5*  HGB 12.8  HCT 38.4  MCV 81.9  PLT 366   Basic Metabolic Panel: Recent Labs  Lab 11/19/20 1040  NA 135  K 4.0  CL 94*  CO2 31  GLUCOSE 164*  BUN 12  CREATININE 0.61  CALCIUM 8.7*   GFR: Estimated Creatinine Clearance: 87.1 mL/min (by C-G formula based on SCr of 0.61 mg/dL). Liver Function Tests: Recent Labs  Lab 11/19/20 1041  AST 16  ALT 17  ALKPHOS 119   BILITOT 1.2  PROT 8.1  ALBUMIN 3.7   No results for input(s): LIPASE, AMYLASE in the last 168 hours. No results for input(s): AMMONIA in the last 168 hours. Coagulation Profile: No results for input(s): INR, PROTIME in the last 168 hours. Cardiac Enzymes: No results for input(s): CKTOTAL, CKMB, CKMBINDEX, TROPONINI in the last 168 hours. BNP (last 3 results) No results for input(s): PROBNP in the last 8760 hours. HbA1C: No results for input(s): HGBA1C in the last 72 hours. CBG: No results for input(s): GLUCAP in the last 168 hours. Lipid Profile: No results for input(s): CHOL, HDL, LDLCALC, TRIG, CHOLHDL, LDLDIRECT in the last 72 hours. Thyroid Function Tests: No results for input(s): TSH, T4TOTAL, FREET4, T3FREE, THYROIDAB in the last 72 hours. Anemia Panel: No results for input(s): VITAMINB12, FOLATE, FERRITIN, TIBC, IRON, RETICCTPCT in the last 72 hours. Urine analysis:    Component Value Date/Time   COLORURINE YELLOW (A) 10/14/2019 1402   APPEARANCEUR HAZY (A) 10/14/2019 1402   LABSPEC 1.018 10/14/2019 1402   PHURINE 6.0 10/14/2019 1402   GLUCOSEU NEGATIVE 10/14/2019 1402   HGBUR NEGATIVE 10/14/2019 1402   BILIRUBINUR NEGATIVE 10/14/2019 1402   KETONESUR NEGATIVE 10/14/2019 1402   PROTEINUR NEGATIVE 10/14/2019 1402   NITRITE NEGATIVE 10/14/2019 1402   LEUKOCYTESUR NEGATIVE 10/14/2019 1402   Sepsis Labs: @LABRCNTIP (procalcitonin:4,lacticidven:4) ) Recent Results (from the past 240 hour(s))  Resp Panel by RT-PCR (Flu A&B, Covid) Nasopharyngeal Swab     Status: None   Collection Time: 11/19/20 10:40 AM   Specimen: Nasopharyngeal Swab; Nasopharyngeal(NP) swabs in vial transport medium  Result Value Ref Range Status   SARS Coronavirus 2 by RT PCR NEGATIVE NEGATIVE Final    Comment: (NOTE) SARS-CoV-2 target nucleic acids are NOT DETECTED.  The SARS-CoV-2 RNA is generally detectable in upper respiratory specimens during the acute phase of infection. The  lowest concentration of SARS-CoV-2 viral copies this assay can detect is 138 copies/mL. A negative result does not preclude SARS-Cov-2 infection and should not be used as the sole basis for treatment or other patient management decisions. A negative result may occur with  improper specimen collection/handling, submission of specimen other than nasopharyngeal swab, presence of viral mutation(s) within the areas targeted by this assay, and inadequate number of viral copies(<138 copies/mL). A negative result must be combined with clinical  observations, patient history, and epidemiological information. The expected result is Negative.  Fact Sheet for Patients:  EntrepreneurPulse.com.au  Fact Sheet for Healthcare Providers:  IncredibleEmployment.be  This test is no t yet approved or cleared by the [place] FDA and  has been authorized for detection and/or diagnosis of SARS-CoV-2 by FDA under an Emergency Use Authorization (EUA). This EUA will remain  in effect (meaning this test can be used) for the duration of the COVID-19 declaration under Section 564(b)(1) of the Act, 21 U.S.C.section 360bbb-3(b)(1), unless the authorization is terminated  or revoked sooner.       Influenza A by PCR NEGATIVE NEGATIVE Final   Influenza B by PCR NEGATIVE NEGATIVE Final    Comment: (NOTE) The Xpert Xpress SARS-CoV-2/FLU/RSV plus assay is intended as an aid in the diagnosis of influenza from Nasopharyngeal swab specimens and should not be used as a sole basis for treatment. Nasal washings and aspirates are unacceptable for Xpert Xpress SARS-CoV-2/FLU/RSV testing.  Fact Sheet for Patients: EntrepreneurPulse.com.au  Fact Sheet for Healthcare Providers: IncredibleEmployment.be  This test is not yet approved or cleared by the [place] FDA and has been authorized for detection and/or diagnosis of SARS-CoV-2 by FDA under  an Emergency Use Authorization (EUA). This EUA will remain in effect (meaning this test can be used) for the duration of the COVID-19 declaration under Section 564(b)(1) of the Act, 21 U.S.C. section 360bbb-3(b)(1), unless the authorization is terminated or revoked.  Performed at [facility], [address] [place], [place] [address]      Radiological Exams on Admission: CT Angio Chest PE W and/or Wo Contrast  Result Date: 11/19/2020 CLINICAL DATA:  Pain in the right lateral chest, shortness of breath EXAM: CT ANGIOGRAPHY CHEST WITH CONTRAST TECHNIQUE: Multidetector CT imaging of the chest was performed using the standard protocol during bolus administration of intravenous contrast. Multiplanar CT image reconstructions and MIPs were obtained to evaluate the vascular anatomy. CONTRAST:  23mL OMNIPAQUE IOHEXOL 350 MG/ML SOLN COMPARISON:  10/19/2020 FINDINGS: Cardiovascular: Satisfactory opacification of the pulmonary arteries to the segmental level. No evidence of pulmonary embolism. Normal heart size. No pericardial effusion. Coronary artery and aortic calcifications. Mediastinum/Nodes: Debris in the trachea and both mainstem bronchi, with evidence of distal mucus plugging. No enlarged mediastinal, hilar, or axillary lymph nodes. Thyroid gland and esophagus demonstrate no significant findings. Lungs/Pleura: Redemonstrated irregular, thick-walled mass in the superior left lower lobe, which measures up to 3.2 x 2.7 x 2.6 cm (series 5, image 151 and series 9, image 58), unchanged compared to the prior exam. Centrilobular emphysema. Mild peribronchial thickening. Peribronchovascular consolidation nodularity in the right middle lobe, which is increased compared to 10/19/2020. Similar consolidation is also noted in the right greater than left dependent lower lobes. No pleural effusion. Upper Abdomen: No acute abnormality. Musculoskeletal: No chest wall abnormality. No acute or significant osseous  findings. Review of the MIP images confirms the above findings. IMPRESSION: 1. Negative for pulmonary embolism. 2. Redemonstrated cavitary lesion in the superior left lower lobe, which remains concerning for bronchogenic carcinoma. 3. Increased consolidation in the right middle lobe, with additional similar consolidative opacities in the right greater than left dependent lower lobes. This may be infectious or inflammatory. 4. Aortic Atherosclerosis (ICD10-I70.0) and Emphysema (ICD10-J43.9). Coronary atherosclerosis. Electronically Signed   By: Merilyn Baba M.D.   On: 11/19/2020 13:17   DG Chest Port 1 View  Result Date: 11/19/2020 CLINICAL DATA:  55 year old female with right lateral chest pain and increased shortness of breath  since yesterday. EXAM: PORTABLE CHEST 1 VIEW COMPARISON:  Chest CT 10/19/2020 and earlier. FINDINGS: Portable AP semi upright view at 1013 hours. Emphysema demonstrated by CT last month. Unresolved and mildly progressed reticulonodular and patchy opacity at the right lung base, involving the right middle lobe on the prior CT. Left lower lobe superior segment cavitary lesion remains occult by portable chest. Streaky left lung base opacity has mildly increased. Stable cardiac size and mediastinal contours. Visualized tracheal air column is within normal limits. No pneumothorax or pleural effusion. IMPRESSION: 1. Unresolved and mildly progressed right middle lobe airspace disease since the CT last month. Bronchopneumonia was favored at that time. 2. Cavitary lesion of the superior segment left lower lobe is occult by portable chest - please see prior CT report. 3. Emphysema. Electronically Signed   By: Genevie Ann M.D.   On: 11/19/2020 10:42     EKG: I have personally reviewed.  Sinus rhythm, QTC 466, tachycardia, nonspecific T wave change  Assessment/Plan Principal Problem:   Lobar pneumonia (HCC) Active Problems:   Lung mass   Sepsis ([place])   HLD (hyperlipidemia)   COPD  exacerbation (HCC)   Stroke (HCC)   Depression   Acute on chronic respiratory failure with hypoxia (HCC)   HTN (hypertension)   Elevated troponin   Sepsis and acute on chronic respiratory failure with hypoxia due to lobar pneumonia and COPD exacerbation: Patient meets criteria for sepsis with leukocytosis with WBC 19.5, tachycardia with heart rate up to 130, tachypnea with RR 28, lactic acid is normal.  Currently hemodynamically stable.  Patient is allergic to steroid, cannot give Solu-Medrol.  Consulted Dr. Raul Del pulmonology  -Admitted to progressive unit as inpatient - IV Vancomycin and Aztreonam.  - Mucinex for cough  - Bronchodilators - Urine legionella and S. pneumococcal antigen - Follow up blood culture x2, sputum culture  - will get Procalcitonin and trend lactic acid level per sepsis protocol - IVF: 2.5L of NS bolus in ED, followed by 75 mL per hour of NS   Lung mass: Patient has cavitary left lower lobe lung mass, highly suspect bronchial carcinoma -Consulted with Dr. Raul Del of pulmonology and Dr. Grayland Ormond of oncology  HLD (hyperlipidemia) -Lipitor  Elevated troponin: Troponin level 63, 57, likely due to demand ischemia -Trend troponin -Lipitor -Start aspirin 81 mg daily -Check A1c, FLP  Hypertension: -IV hydralazine as needed -Amlodipine  Stroke ([place]) -Lipitor -Hold Plavix in case patient needed biopsy   Depression -Nortriptyline          DVT ppx: SQ Heparin  Code Status: Full code Family Communication: Yes, patient's brother at bed side Disposition Plan:  Anticipate discharge back to previous environment Consults called: Dr. Grayland Ormond of oncology and Dr. Raul Del of pulmonology were consulted Admission status and Level of care: Progressive Cardiac:   as inpt       Status is: Inpatient  Remains inpatient appropriate because:Inpatient level of care appropriate due to severity of illness  Dispo: The patient is from: Home               Anticipated d/c is to: Home              Patient currently is not medically stable to d/c.   Difficult to place patient No           Date of Service 11/19/2020    Ivor Costa [facility]   If 7PM-7AM, please contact night-coverage [email or web address] 11/19/2020, 6:51 PM

## 2020-11-19 NOTE — ED Notes (Signed)
Sonja RN aware of assigned bed

## 2020-11-19 NOTE — Progress Notes (Signed)
PHARMACY -  BRIEF ANTIBIOTIC NOTE   Pharmacy has received consult(s) for Vancomycin and aztreonam from an ED provider.  The patient's profile has been reviewed for ht/wt/allergies/indication/available labs.    One time order(s) placed by MD for Aztreonam 2 gm and Vancomycin 1gm  Further antibiotics/pharmacy consults should be ordered by admitting physician if indicated.                       Thank you, Parminder Trapani A 11/19/2020  1:54 PM

## 2020-11-19 NOTE — ED Triage Notes (Signed)
Pt states she woke up yesterday with pain to the right lateral chest and has had increased SOB since, pt is tachypnic in triage' pt is on 2L [place] continuous at home, 88% on 2L on arrival pt turned up to 3L and now 93%

## 2020-11-19 NOTE — ED Provider Notes (Signed)
[facility] Emergency Department Provider Note  ____________________________________________   Event Date/Time   First MD Initiated Contact with Patient 11/19/20 1007     (approximate)  I have reviewed the triage vital signs and the nursing notes.   HISTORY  Chief Complaint Chest Pain and Shortness of Breath    HPI Stephanie Ross is a 55 y.o. female with COPD on 2 L of oxygen who comes in with chest pain and shortness of breath.  Patient states that she was recently treated for pneumonia back in July.  She completed her course of antibiotics since that she started feeling better.  Until last night she developed right-sided chest pain that was constant, worse with taking a deep breath, nothing makes it better.  She states that then she started feeling more short of breath, constant.  She denies any fevers.  Denies any worsening cough.  Denies any abdominal pain.          Past Medical History:  Diagnosis Date   Asthma    Cancer ([place])    COPD (chronic obstructive pulmonary disease) ([place])    Hypertension     Patient Active Problem List   Diagnosis Date Noted   Pre-op evaluation    Essential hypertension    Aortic valve regurgitation    Acute appendicitis 10/14/2019   CVA (cerebral vascular accident) ([place]) 08/17/2019    Past Surgical History:  Procedure Laterality Date   BILATERAL CARPAL TUNNEL RELEASE     CERVICAL ABLATION     ROTATOR CUFF REPAIR Right     Prior to Admission medications   Medication Sig Start Date End Date Taking? Authorizing Provider  albuterol (PROVENTIL HFA;VENTOLIN HFA) 108 (90 Base) MCG/ACT inhaler Inhale 2 puffs into the lungs every 4 (four) hours as needed for wheezing or shortness of breath. 05/07/17   Noemi Chapel, MD  amLODipine (NORVASC) 5 MG tablet Take 5 mg by mouth at bedtime.     [provider]  atorvastatin (LIPITOR) 20 MG tablet Take 20 mg by mouth daily.    [provider]   azithromycin (ZITHROMAX) 250 MG tablet Take 250 (1 pill) daily 10/18/19   [name], MD  budesonide-formoterol [facility]) 160-4.5 MCG/ACT inhaler Inhale 2 puffs into the lungs 2 (two) times daily.    [provider]  calcium carbonate (OS-CAL) 1250 (500 Ca) MG chewable tablet Chew 500 mg by mouth daily.    [provider]  celecoxib (CELEBREX) 200 MG capsule Take 200 mg by mouth daily.    [provider]  cetirizine (ZYRTEC) 10 MG tablet Take 10 mg by mouth daily as needed for allergies.     [provider]  clopidogrel (PLAVIX) 75 MG tablet Take 75 mg by mouth daily. 09/08/19   [provider]  Coenzyme Q10 (COQ10) 100 MG CAPS Take 100 mg by mouth daily.     [provider]  doxycycline (ADOXA) 100 MG tablet Take 1 tablet (100 mg total) by mouth 2 (two) times daily. 10/06/20   Fisher, Linden Dolin, PA-C  ergocalciferol (VITAMIN D2) 1.25 MG (50000 UT) capsule Take 50,000 Units by mouth once a week.    [provider]  montelukast (SINGULAIR) 10 MG tablet Take 10 mg by mouth at bedtime. 08/02/19   [provider]  nortriptyline (PAMELOR) 10 MG capsule Take 20 mg by mouth at bedtime. 10/06/19   [provider]  tiotropium (SPIRIVA) 18 MCG inhalation capsule Place 1 capsule (18 mcg total) into inhaler  and inhale daily. 10/06/20   Fisher, Linden Dolin, PA-C    Allergies Amoxicillin and Prednisone  Family History  Problem Relation Age of Onset   Breast cancer Sister    Breast cancer Paternal Grandmother     Social History Social History   Tobacco Use   Smoking status: Former    Packs/day: 0.25    Types: Cigarettes    Quit date: 07/17/2019    Years since quitting: 1.3   Smokeless tobacco: Never  Vaping Use   Vaping Use: Never used  Substance Use Topics   Alcohol use: No   Drug use: Yes    Types: Marijuana    Comment: occasionally      Review of Systems Constitutional: No fever/chills Eyes: No visual  changes. ENT: No sore throat. Cardiovascular: Positive chest pain Respiratory: Positive for SOB Gastrointestinal: No abdominal pain.  No nausea, no vomiting.  No diarrhea.  No constipation. Genitourinary: Negative for dysuria. Musculoskeletal: Negative for back pain. Skin: Negative for rash. Neurological: Negative for headaches, focal weakness or numbness. All other ROS negative ____________________________________________   PHYSICAL EXAM:  VITAL SIGNS: ED Triage Vitals  Enc Vitals Group     BP 11/19/20 0957 129/66     Pulse Rate 11/19/20 0957 (!) 130     Resp 11/19/20 0957 (!) 28     Temp 11/19/20 1007 99.6 F (37.6 C)     Temp Source 11/19/20 0957 Oral     SpO2 11/19/20 0957 (!) 88 %     Weight 11/19/20 0958 218 lb (98.9 kg)     Height 11/19/20 0958 5' 2.5" (1.588 m)     Head Circumference --      Peak Flow --      Pain Score 11/19/20 0958 10     Pain Loc --      Pain Edu? --      Excl. in [place]? --     Constitutional: Alert and oriented.  Appears uncomfortable Eyes: Conjunctivae are normal. EOMI. Head: Atraumatic. Nose: No congestion/rhinnorhea. Mouth/Throat: Mucous membranes are moist.   Neck: No stridor. Trachea Midline. FROM Cardiovascular: Tachycardic, regular rhythm. Grossly normal heart sounds.  Good peripheral circulation. Respiratory: Crackles bilaterally, increased work of breathing on 3 L Gastrointestinal: Soft and nontender. No distention. No abdominal bruits.  Musculoskeletal: No lower extremity tenderness nor edema.  No joint effusions. Neurologic:  Normal speech and language. No gross focal neurologic deficits are appreciated.  Skin:  Skin is warm, dry and intact. No rash noted. Psychiatric: Mood and affect are normal. Speech and behavior are normal. GU: Deferred   ____________________________________________   LABS (all labs ordered are listed, but only abnormal results are displayed)  Labs Reviewed  RESP PANEL BY RT-PCR (FLU A&B, COVID) [id number]   CULTURE, BLOOD (ROUTINE X 2)  CULTURE, BLOOD (ROUTINE X 2)  BASIC METABOLIC PANEL  CBC  BRAIN NATRIURETIC PEPTIDE  LACTIC ACID, PLASMA  LACTIC ACID, PLASMA  PROCALCITONIN  HEPATIC FUNCTION PANEL  POC URINE PREG, ED  TROPONIN I (HIGH SENSITIVITY)   ____________________________________________   ED ECG REPORT I, Vanessa Pigeon, the attending physician, personally viewed and interpreted this ECG.  Sinus tachycardia rate of 131, no ST elevation, T wave inversions in V6 lead II, III, aVF, normal intervals ____________________________________________  RADIOLOGY Robert Bellow, personally viewed and evaluated these images (plain radiographs) as part of my medical decision making, as well as reviewing the written report by the radiologist.  ED MD interpretation: Pneumonia noted on the right  middle lobe  Official radiology report(s): DG Chest Port 1 View  Result Date: 11/19/2020 CLINICAL DATA:  55 year old female with right lateral chest pain and increased shortness of breath since yesterday. EXAM: PORTABLE CHEST 1 VIEW COMPARISON:  Chest CT 10/19/2020 and earlier. FINDINGS: Portable AP semi upright view at 1013 hours. Emphysema demonstrated by CT last month. Unresolved and mildly progressed reticulonodular and patchy opacity at the right lung base, involving the right middle lobe on the prior CT. Left lower lobe superior segment cavitary lesion remains occult by portable chest. Streaky left lung base opacity has mildly increased. Stable cardiac size and mediastinal contours. Visualized tracheal air column is within normal limits. No pneumothorax or pleural effusion. IMPRESSION: 1. Unresolved and mildly progressed right middle lobe airspace disease since the CT last month. Bronchopneumonia was favored at that time. 2. Cavitary lesion of the superior segment left lower lobe is occult by portable chest - please see prior CT report. 3. Emphysema. Electronically Signed   By: Genevie Ann M.D.   On:  11/19/2020 10:42    ____________________________________________   PROCEDURES  Procedure(s) performed (including Critical Care):  .1-3 Lead EKG Interpretation  Date/Time: 11/19/2020 11:02 AM Performed by: Vanessa Joy, MD Authorized by: Vanessa [place], MD     Interpretation: abnormal     ECG rate:  130s   ECG rate assessment: tachycardic     Rhythm: sinus tachycardia     Ectopy: none     Conduction: normal   .Critical Care  Date/Time: 11/19/2020 1:49 PM Performed by: Vanessa [place], MD Authorized by: Vanessa Baywood, MD   Critical care provider statement:    Critical care time (minutes):  45   Critical care was necessary to treat or prevent imminent or life-threatening deterioration of the following conditions:  Sepsis   Critical care was time spent personally by me on the following activities:  Discussions with consultants, evaluation of patient's response to treatment, examination of patient, ordering and performing treatments and interventions, ordering and review of laboratory studies, ordering and review of radiographic studies, pulse oximetry, re-evaluation of patient's condition, obtaining history from patient or surrogate and review of old charts   ____________________________________________   INITIAL IMPRESSION / ASSESSMENT AND PLAN / ED COURSE   Stephanie Ross was evaluated in Emergency Department on 11/19/2020 for the symptoms described in the history of present illness. She was evaluated in the context of the global COVID-19 pandemic, which necessitated consideration that the patient might be at [facility] for infection with the SARS-CoV-2 virus that causes COVID-19. Institutional protocols and algorithms that pertain to the evaluation of patients at [facility] for COVID-19 are in a state of rapid change based on information released by regulatory bodies including the CDC and federal and state organizations. These policies and algorithms were followed during the patient's care  in the ED.     Pt presents with SOB patient had increased from her baseline 2 L up to 3 L and is notably tachycardic.  Patient's had unresolving pneumonia.  We will get blood cultures and lactates and get a CT scan to evaluate for pulmonary embolism or any other complications such as abscess, empyema.  We will keep patient on the cardiac monitor to evaluate for decompensation.  . Differential includes: PNA-will get xray to evaluation Anemia-CBC to evaluate ACS- will get trops Arrhythmia-Will get EKG and keep on monitor.  COVID- will get testing per algorithm.   1:49 PM source has been identified as possible lung cancer and  therefore broad-spectrum antibiotics were ordered.  Discussed with pharmacy given patient has the severe reaction amoxicillin and that she has never been on ceftriaxone before as well as already completed course of Levaquin.  We will start broad-spectrum antibiotics for now.  Patient remains tachycardic by did only did 1 L of fluids given history of heart failure with known echo on file.  Patient is tolerated the first liter so give a second liter given heart rates have come down to the 120s.  Patient states that she is feeling better.  I did explain the concern for possible lung cancer on her CT imaging which she is aware of.  Given patient's hypoxic with continued tachycardia we will discussed the hospital team for admission  ____________________________________________   FINAL CLINICAL IMPRESSION(S) / ED DIAGNOSES   Final diagnoses:  Sepsis, due to unspecified organism, unspecified whether acute organ dysfunction present [facility])  Atypical chest pain  Recurrent pneumonia  Malignant neoplasm of left lung, unspecified part of lung ([place])  Acute respiratory failure with hypoxia ([place])     MEDICATIONS GIVEN DURING THIS VISIT:  Medications  aztreonam (AZACTAM) 2 g in sodium chloride 0.9 % 100 mL IVPB (has no administration in time range)  vancomycin (VANCOCIN) IVPB 1000  mg/200 mL premix (has no administration in time range)  metroNIDAZOLE (FLAGYL) IVPB 500 mg (has no administration in time range)  sodium chloride 0.9 % bolus 1,000 mL (has no administration in time range)  sodium chloride 0.9 % bolus 1,000 mL (1,000 mLs Intravenous New Bag/Given 11/19/20 1048)  fentaNYL (SUBLIMAZE) injection 50 mcg (50 mcg Intravenous Given 11/19/20 1045)  ondansetron (ZOFRAN) injection 4 mg (4 mg Intravenous Given 11/19/20 1043)  iohexol (OMNIPAQUE) 350 MG/ML injection 75 mL (75 mLs Intravenous Contrast Given 11/19/20 1218)     ED Discharge Orders     None        Note:  This document was prepared using Dragon voice recognition software and may include unintentional dictation errors.   Vanessa [place], MD 11/19/20 1350

## 2020-11-19 NOTE — Consult Note (Signed)
Pulmonary Medicine          Date: 11/19/2020,   MRN# [id number] Stephanie Ross 04/24/1965     AdmissionWeight: 98.9 kg                 CurrentWeight: 98.9 kg      CHIEF COMPLAINT:   Respiratory failure   HISTORY OF PRESENT ILLNESS   This is a pleasant 55 year old lady, who come in to the ER with worsening sob,  chest tightness, right side pain but no hemoptysis. Her sat were low, heart rat in 130's. Chest ct should no pe. She is not c/o leg pain or swelling. There was no hx of fever, chills or sweats. She also has a left mass like lesion that was noted the end of July. W/u in progress. Fev1 0.43 liters. She is not a surgical candidate. She was not keen for a biopsy. She was to decide on emperic sbrt etc with oncology involve.   She being here she mention her respiratory distress has improved. Will follow in the hospital.    PAST MEDICAL HISTORY   Past Medical History:  Diagnosis Date   Asthma    Cancer ([place])    COPD (chronic obstructive pulmonary disease) ([place])    Hypertension      SURGICAL HISTORY   Past Surgical History:  Procedure Laterality Date   BILATERAL CARPAL TUNNEL RELEASE     CERVICAL ABLATION     ROTATOR CUFF REPAIR Right      FAMILY HISTORY   Family History  Problem Relation Age of Onset   Breast cancer Sister    Breast cancer Paternal Grandmother      SOCIAL HISTORY   Social History   Tobacco Use   Smoking status: Former    Packs/day: 0.25    Types: Cigarettes    Quit date: 07/17/2019    Years since quitting: 1.3   Smokeless tobacco: Never  Vaping Use   Vaping Use: Never used  Substance Use Topics   Alcohol use: No   Drug use: Yes    Types: Marijuana    Comment: occasionally     MEDICATIONS    Home Medication:  Current Outpatient Rx   Order #: [id number] Class: Print   Order #: [id number] Class: Historical Med   Order #: [id number] Class: Historical Med   Order #: [id number] Class: Historical Med   Order #:  [id number] Class: Historical Med   Order #: [id number] Class: Historical Med   Order #: [id number] Class: Historical Med   Order #: [id number] Class: Historical Med   Order #: [id number] Class: Historical Med   Order #: [id number] Class: Historical Med   Order #: [id number] Class: Historical Med   Order #: [id number] Class: Historical Med   Order #: [id number] Class: Historical Med   Order #: [id number] Class: Historical Med   Order #: [id number] Class: Historical Med   Order #: [id number] Class: Normal   Order #: [id number] Class: Normal   Order #: [id number] Class: Normal   Order #: [id number] Class: Historical Med    Current Medication:  Current Facility-Administered Medications:    0.9 %  sodium chloride infusion, , Intravenous, Continuous, Ivor Costa, MD   acetaminophen (TYLENOL) tablet 650 mg, 650 mg, Oral, Q6H PRN, Ivor Costa, MD   albuterol (PROVENTIL) (2.5 MG/3ML) 0.083% nebulizer solution 2.5 mg, 2.5 mg, Nebulization, Q4H PRN, Ivor Costa, MD   aspirin EC tablet 81 mg, 81 mg, Oral, Daily, Ivor Costa, MD   aztreonam (AZACTAM) 2 g in sodium chloride 0.9 % 100 mL IVPB, 2 g,  Intravenous, Q8H, Berta Minor, RPH   dextromethorphan-guaiFENesin ([place] DM) 30-600 MG per 12 hr tablet 1 tablet, 1 tablet, Oral, BID PRN, Ivor Costa, MD   hydrALAZINE (APRESOLINE) injection 5 mg, 5 mg, Intravenous, Q2H PRN, Ivor Costa, MD   ipratropium-albuterol (DUONEB) 0.5-2.5 (3) MG/3ML nebulizer solution 3 mL, 3 mL, Nebulization, Q4H, Ivor Costa, MD, 3 mL at 11/19/20 1615   metroNIDAZOLE (FLAGYL) IVPB 500 mg, 500 mg, Intravenous, Once, Vanessa Riverside, MD, Last Rate: 100 mL/hr at 11/19/20 1614, 500 mg at 11/19/20 1614   morphine 2 MG/ML injection 2 mg, 2 mg, Intravenous, Q4H PRN, Ivor Costa, MD, 2 mg at 11/19/20 1434   ondansetron (ZOFRAN) injection 4 mg, 4 mg, Intravenous, Q8H PRN, Ivor Costa, MD   oxyCODONE-acetaminophen (PERCOCET/ROXICET) 5-325 MG per tablet 1 tablet, 1 tablet, Oral, Q4H PRN, Ivor Costa, MD   sodium chloride  0.9 % bolus 500 mL, 500 mL, Intravenous, Once, Ivor Costa, MD   vancomycin (VANCOCIN) IVPB 1000 mg/200 mL premix, 1,000 mg, Intravenous, Once **AND** vancomycin (VANCOREADY) IVPB 1250 mg/250 mL, 1,250 mg, Intravenous, Once, Berta Minor, [facility]  Current Outpatient Medications:    albuterol (PROVENTIL HFA;VENTOLIN HFA) 108 (90 Base) MCG/ACT inhaler, Inhale 2 puffs into the lungs every 4 (four) hours as needed for wheezing or shortness of breath., Disp: 1 Inhaler, Rfl: 3   amLODipine (NORVASC) 5 MG tablet, Take 5 mg by mouth at bedtime. , Disp: , Rfl:    atorvastatin (LIPITOR) 20 MG tablet, Take 20 mg by mouth daily., Disp: , Rfl:    baclofen (LIORESAL) 10 MG tablet, Take 10 mg by mouth 3 (three) times daily., Disp: , Rfl:    budesonide-formoterol (SYMBICORT) 160-4.5 MCG/ACT inhaler, Inhale 2 puffs into the lungs 2 (two) times daily., Disp: , Rfl:    calcium carbonate (OS-CAL) 1250 (500 Ca) MG chewable tablet, Chew 500 mg by mouth daily., Disp: , Rfl:    celecoxib (CELEBREX) 200 MG capsule, Take 200 mg by mouth daily., Disp: , Rfl:    cetirizine (ZYRTEC) 10 MG tablet, Take 10 mg by mouth daily as needed for allergies. , Disp: , Rfl:    clopidogrel (PLAVIX) 75 MG tablet, Take 75 mg by mouth daily., Disp: , Rfl:    Coenzyme Q10 (COQ10) 100 MG CAPS, Take 100 mg by mouth daily. , Disp: , Rfl:    DALIRESP 500 MCG TABS tablet, Take 500 mcg by mouth daily., Disp: , Rfl:    ergocalciferol (VITAMIN D2) 1.25 MG (50000 UT) capsule, Take 50,000 Units by mouth every Monday., Disp: , Rfl:    furosemide (LASIX) 20 MG tablet, Take 20 mg by mouth daily., Disp: , Rfl:    montelukast (SINGULAIR) 10 MG tablet, Take 10 mg by mouth at bedtime., Disp: , Rfl:    nortriptyline (PAMELOR) 10 MG capsule, Take 20 mg by mouth daily., Disp: , Rfl:    tiotropium (SPIRIVA) 18 MCG inhalation capsule, Place 1 capsule (18 mcg total) into inhaler and inhale daily., Disp: 30 capsule, Rfl: 12   azithromycin (ZITHROMAX) 250 MG tablet,  Take 250 (1 pill) daily (Patient not taking: Reported on 11/19/2020), Disp: 5 each, Rfl: 0   doxycycline (ADOXA) 100 MG tablet, Take 1 tablet (100 mg total) by mouth 2 (two) times daily. (Patient not taking: No sig reported), Disp: 20 tablet, Rfl: 0   INCRUSE ELLIPTA 62.5 MCG/INH AEPB, Inhale 1 puff into the lungs daily. (Patient not taking: Reported on 11/19/2020), Disp: , Rfl:     ALLERGIES  Amoxicillin, Prednisone, Doxycycline, and Metoprolol     REVIEW OF SYSTEMS    Review of Systems:  Gen:  Denies  fever, sweats, chills weigh loss  HEENT: Denies blurred vision, double vision, ear pain, eye pain, hearing loss, nose bleeds, sore throat Cardiac:  No dizziness, chest pain or heaviness, chest tightness,edema Resp:   Denies cough or sputum porduction, +++shortness of breath,+ wheezing, hemoptysis,  Gi: Denies swallowing difficulty, stomach pain, nausea or vomiting, diarrhea, constipation, bowel incontinence Gu:  Denies bladder incontinence, burning urine Ext:   Denies Joint pain, stiffness or swelling Skin: Denies  skin rash, easy bruising or bleeding or hives Endoc:  Denies polyuria, polydipsia , polyphagia or weight change Psych:   Denies depression, insomnia or hallucinations   Other:  All other systems negative   VS: BP 113/64   Pulse (!) 118   Temp 99.6 F (37.6 C)   Resp (!) 21   Ht 5' 2.5" (1.588 m)   Wt 98.9 kg   SpO2 94%   BMI 39.24 kg/m      PHYSICAL EXAM    GENERAL:NAD, no fevers, chills, no weakness no fatigue, over weight HEAD: Normocephalic, atraumatic.  EYES: Pupils equal, round, reactive to light. Extraocular muscles intact. No scleral icterus.  MOUTH: Moist mucosal membrane. Dentition intact. No abscess noted.  EAR, NOSE, THROAT: Clear without exudates. No external lesions.  NECK: Supple. No thyromegaly. No nodules. No JVD.  PULMONARY: moving some air +wheezes, no use of accessory muscles CARDIOVASCULAR: Tachycardia, S1 and S2. Regular rate and  rhythm. No murmurs, rubs, or gallops. No edema. Pedal pulses 2+ bilaterally. ABD: Obese, soft, non tender  GASTROINTESTINAL: Soft, nontender, nondistended. No masses. Positive bowel sounds. No hepatosplenomegaly.  MUSCULOSKELETAL: No swelling, clubbing, or edema. Range of motion full in all extremities.  NEUROLOGIC: Cranial nerves II through XII are intact. No gross focal neurological deficits. Sensation intact. Reflexes intact.  SKIN: No ulceration, lesions, rashes, or cyanosis. Skin warm and dry. Turgor intact.  PSYCHIATRIC: Mood, affect within normal limits. The patient is awake, alert and oriented x 3. Insight, judgment intact.       IMAGING    CT Angio Chest PE W and/or Wo Contrast  Result Date: 11/19/2020 CLINICAL DATA:  Pain in the right lateral chest, shortness of breath EXAM: CT ANGIOGRAPHY CHEST WITH CONTRAST TECHNIQUE: Multidetector CT imaging of the chest was performed using the standard protocol during bolus administration of intravenous contrast. Multiplanar CT image reconstructions and MIPs were obtained to evaluate the vascular anatomy. CONTRAST:  28mL OMNIPAQUE IOHEXOL 350 MG/ML SOLN COMPARISON:  10/19/2020 FINDINGS: Cardiovascular: Satisfactory opacification of the pulmonary arteries to the segmental level. No evidence of pulmonary embolism. Normal heart size. No pericardial effusion. Coronary artery and aortic calcifications. Mediastinum/Nodes: Debris in the trachea and both mainstem bronchi, with evidence of distal mucus plugging. No enlarged mediastinal, hilar, or axillary lymph nodes. Thyroid gland and esophagus demonstrate no significant findings. Lungs/Pleura: Redemonstrated irregular, thick-walled mass in the superior left lower lobe, which measures up to 3.2 x 2.7 x 2.6 cm (series 5, image 151 and series 9, image 58), unchanged compared to the prior exam. Centrilobular emphysema. Mild peribronchial thickening. Peribronchovascular consolidation nodularity in the right middle  lobe, which is increased compared to 10/19/2020. Similar consolidation is also noted in the right greater than left dependent lower lobes. No pleural effusion. Upper Abdomen: No acute abnormality. Musculoskeletal: No chest wall abnormality. No acute or significant osseous findings. Review of the MIP images confirms the above findings. IMPRESSION: 1.  Negative for pulmonary embolism. 2. Redemonstrated cavitary lesion in the superior left lower lobe, which remains concerning for bronchogenic carcinoma. 3. Increased consolidation in the right middle lobe, with additional similar consolidative opacities in the right greater than left dependent lower lobes. This may be infectious or inflammatory. 4. Aortic Atherosclerosis (ICD10-I70.0) and Emphysema (ICD10-J43.9). Coronary atherosclerosis. Electronically Signed   By: Merilyn Baba M.D.   On: 11/19/2020 13:17   US Venous Img Lower Unilateral Left (DVT)  Result Date: 10/29/2020 CLINICAL DATA:  Left lower extremity edema. EXAM: LEFT LOWER EXTREMITY VENOUS DOPPLER ULTRASOUND TECHNIQUE: Gray-scale sonography with graded compression, as well as color Doppler and duplex ultrasound were performed to evaluate the lower extremity deep venous systems from the level of the common femoral vein and including the common femoral, femoral, profunda femoral, popliteal and calf veins including the posterior tibial, peroneal and gastrocnemius veins when visible. The superficial great saphenous vein was also interrogated. Spectral Doppler was utilized to evaluate flow at rest and with distal augmentation maneuvers in the common femoral, femoral and popliteal veins. COMPARISON:  None. FINDINGS: Contralateral Common Femoral Vein: Respiratory phasicity is normal and symmetric with the symptomatic side. No evidence of thrombus. Normal compressibility. Common Femoral Vein: No evidence of thrombus. Normal compressibility, respiratory phasicity and response to augmentation. Saphenofemoral  Junction: No evidence of thrombus. Normal compressibility and flow on color Doppler imaging. Profunda Femoral Vein: No evidence of thrombus. Normal compressibility and flow on color Doppler imaging. Femoral Vein: No evidence of thrombus. Normal compressibility, respiratory phasicity and response to augmentation. Popliteal Vein: No evidence of thrombus. Normal compressibility, respiratory phasicity and response to augmentation. Calf Veins: No evidence of thrombus. Normal compressibility and flow on color Doppler imaging. Superficial Great Saphenous Vein: No evidence of thrombus. Normal compressibility. Venous Reflux:  None. Other Findings: No evidence of superficial thrombophlebitis or abnormal fluid collection. IMPRESSION: No evidence of left lower extremity deep venous thrombosis. Electronically Signed   By: Aletta Edouard M.D.   On: 10/29/2020 11:25   DG Chest Port 1 View  Result Date: 11/19/2020 CLINICAL DATA:  55 year old female with right lateral chest pain and increased shortness of breath since yesterday. EXAM: PORTABLE CHEST 1 VIEW COMPARISON:  Chest CT 10/19/2020 and earlier. FINDINGS: Portable AP semi upright view at 1013 hours. Emphysema demonstrated by CT last month. Unresolved and mildly progressed reticulonodular and patchy opacity at the right lung base, involving the right middle lobe on the prior CT. Left lower lobe superior segment cavitary lesion remains occult by portable chest. Streaky left lung base opacity has mildly increased. Stable cardiac size and mediastinal contours. Visualized tracheal air column is within normal limits. No pneumothorax or pleural effusion. IMPRESSION: 1. Unresolved and mildly progressed right middle lobe airspace disease since the CT last month. Bronchopneumonia was favored at that time. 2. Cavitary lesion of the superior segment left lower lobe is occult by portable chest - please see prior CT report. 3. Emphysema. Electronically Signed   By: Genevie Ann M.D.   On:  11/19/2020 10:42            ASSESSMENT/PLAN   This is a lady a saw resecently in the office. She is known to have very severe copd ( fev1 0.43 liters). Came is wirt acute on chronic dyspnea, low sats. Noted to have RLL pneumonia, and a LLL mass. No change ( MASS) on repeat ct from the one of last month. No pulmonary embolism. We discussed bx then and now and she is refusing biopsy. She  is not a surgical candidate due to her advanced copd. We discussed then and now xrt ( sbrt). She was to decide. Oncology has seen. Pet scan is scheduled as an out patient. Further order per above    The plan is the treat her copd exacerbation as you are doing. She is better since the recent neb. Her heart rate is down to 115/min . She is refusing steroid iv or po ( lead to transfusion, not clear of the relationship with steroids). Hence, if need be we could add theodur 300 mg bid. Since she is improving will hold off -dvt prophylaxis     Secondly, treat what now looks like pneumonia. (Rocephin/zithro or the equivalent).  Agree with -Procalcitonin -Mrsa -[place] urine ag -viral panel -bnp  Thanks further orders per above    Thank you for allowing me to participate in the care of this patient.   Patient/Family are satisfied with care plan and all questions have been answered.  This document was prepared using Dragon voice recognition software and may include unintentional dictation errors.     Wallene Huh, M.D.  Division of [place]

## 2020-11-20 LAB — PROCALCITONIN: Procalcitonin: 0.12 ng/mL

## 2020-11-20 LAB — CBC
HCT: 33.8 % — ABNORMAL LOW (ref 36.0–46.0)
Hemoglobin: 10.9 g/dL — ABNORMAL LOW (ref 12.0–15.0)
MCH: 27.5 pg (ref 26.0–34.0)
MCHC: 32.2 g/dL (ref 30.0–36.0)
MCV: 85.1 fL (ref 80.0–100.0)
Platelets: 242 10*3/uL (ref 150–400)
RBC: 3.97 MIL/uL (ref 3.87–5.11)
RDW: 16.7 % — ABNORMAL HIGH (ref 11.5–15.5)
WBC: 18.3 10*3/uL — ABNORMAL HIGH (ref 4.0–10.5)
nRBC: 0 % (ref 0.0–0.2)

## 2020-11-20 LAB — LIPID PANEL
Cholesterol: 156 mg/dL (ref 0–200)
HDL: 85 mg/dL (ref >40)
LDL Cholesterol: 58 mg/dL (ref 0–99)
Total CHOL/HDL Ratio: 1.8 RATIO
Triglycerides: 64 mg/dL (ref <150)
VLDL: 13 mg/dL (ref 0–40)

## 2020-11-20 LAB — HIV ANTIBODY (ROUTINE TESTING W REFLEX): HIV Screen 4th Generation wRfx: NONREACTIVE

## 2020-11-20 LAB — BASIC METABOLIC PANEL
Anion gap: 6 (ref 5–15)
BUN: 12 mg/dL (ref 6–20)
CO2: 30 mmol/L (ref 22–32)
Calcium: 8.8 mg/dL — ABNORMAL LOW (ref 8.9–10.3)
Chloride: 101 mmol/L (ref 98–111)
Creatinine, Ser: 0.59 mg/dL (ref 0.44–1.00)
GFR, Estimated: >60 mL/min (ref >60)
Glucose, Bld: 160 mg/dL — ABNORMAL HIGH (ref 70–99)
Potassium: 4.6 mmol/L (ref 3.5–5.1)
Sodium: 137 mmol/L (ref 135–145)

## 2020-11-20 LAB — MAGNESIUM: Magnesium: 2 mg/dL (ref 1.7–2.4)

## 2020-11-20 MED ORDER — DM-GUAIFENESIN ER 30-600 MG PO TB12
1.0000 | ORAL_TABLET | Freq: Two times a day (BID) | ORAL | Status: DC
  Administered 2020-11-20 – 2020-11-21 (×3): 1 via ORAL
  Filled 2020-11-20 (×3): qty 1

## 2020-11-20 MED ORDER — LEVALBUTEROL HCL 0.63 MG/3ML IN NEBU: 0.6300 mg | INHALATION_SOLUTION | Freq: Four times a day (QID) | RESPIRATORY_TRACT | Status: DC | PRN

## 2020-11-20 MED ORDER — MORPHINE SULFATE (PF) 2 MG/ML IV SOLN
1.0000 mg | INTRAVENOUS | Status: DC | PRN
  Administered 2020-11-20: 1 mg via INTRAVENOUS
  Filled 2020-11-20: qty 1

## 2020-11-20 MED ORDER — IPRATROPIUM BROMIDE 0.02 % IN SOLN
0.5000 mg | RESPIRATORY_TRACT | Status: DC
  Administered 2020-11-20: 0.5 mg via RESPIRATORY_TRACT
  Filled 2020-11-20: qty 2.5

## 2020-11-20 MED ORDER — IPRATROPIUM BROMIDE 0.02 % IN SOLN
0.5000 mg | Freq: Four times a day (QID) | RESPIRATORY_TRACT | Status: DC
  Administered 2020-11-20 – 2020-11-21 (×4): 0.5 mg via RESPIRATORY_TRACT
  Filled 2020-11-20 (×4): qty 2.5

## 2020-11-20 MED ORDER — LEVOFLOXACIN IN D5W 750 MG/150ML IV SOLN
750.0000 mg | INTRAVENOUS | Status: DC
  Administered 2020-11-20 – 2020-11-21 (×2): 750 mg via INTRAVENOUS
  Filled 2020-11-20 (×2): qty 150

## 2020-11-20 MED ORDER — CALCIUM CARBONATE ANTACID 500 MG PO CHEW
1.0000 | CHEWABLE_TABLET | Freq: Four times a day (QID) | ORAL | Status: DC | PRN
  Administered 2020-11-20: 200 mg via ORAL
  Filled 2020-11-20: qty 1

## 2020-11-20 MED ORDER — BENZONATATE 100 MG PO CAPS: 100.0000 mg | ORAL_CAPSULE | Freq: Three times a day (TID) | ORAL | Status: DC | PRN

## 2020-11-20 MED ORDER — IPRATROPIUM-ALBUTEROL 0.5-2.5 (3) MG/3ML IN SOLN: 3.0000 mL | Freq: Four times a day (QID) | RESPIRATORY_TRACT | Status: DC

## 2020-11-20 NOTE — Progress Notes (Signed)
Received SBAR handoff from [place], [place]. Patient sitting up in bed, watching TV. No complaints at this time.

## 2020-11-20 NOTE — Progress Notes (Signed)
Pulmonary Medicine          Date: 11/20/2020,   MRN# [id number] Stephanie Ross 1965/08/06       HISTORY OF PRESENT ILLNESS   She had a better night. Less sob, answered her questions, placed on levaquin.  No chest pain, leg pain, wheezing or fever. No hemoptysis   PAST MEDICAL HISTORY   Past Medical History:  Diagnosis Date   Asthma    Cancer ([place])    COPD (chronic obstructive pulmonary disease) ([place])    Hypertension      SURGICAL HISTORY   Past Surgical History:  Procedure Laterality Date   BILATERAL CARPAL TUNNEL RELEASE     CERVICAL ABLATION     ROTATOR CUFF REPAIR Right      FAMILY HISTORY   Family History  Problem Relation Age of Onset   Breast cancer Sister    Breast cancer Paternal Grandmother      SOCIAL HISTORY   Social History   Tobacco Use   Smoking status: Former    Packs/day: 0.25    Types: Cigarettes    Quit date: 07/17/2019    Years since quitting: 1.3   Smokeless tobacco: Never  Vaping Use   Vaping Use: Never used  Substance Use Topics   Alcohol use: No   Drug use: Yes    Types: Marijuana    Comment: occasionally     MEDICATIONS    Home Medication:    Current Medication:  Current Facility-Administered Medications:    0.9 %  sodium chloride infusion, , Intravenous, Continuous, Ivor Costa, MD, Last Rate: 75 mL/hr at 11/20/20 1232, New Bag at 11/20/20 1232   acetaminophen (TYLENOL) tablet 650 mg, 650 mg, Oral, Q6H PRN, Ivor Costa, MD, 650 mg at 11/19/20 1845   aspirin EC tablet 81 mg, 81 mg, Oral, Daily, Ivor Costa, MD, 81 mg at 11/20/20 1035   atorvastatin (LIPITOR) tablet 20 mg, 20 mg, Oral, Daily, Ivor Costa, MD, 20 mg at 11/20/20 1035   baclofen (LIORESAL) tablet 10 mg, 10 mg, Oral, TID PRN, Ivor Costa, MD   calcium carbonate (OS-CAL - dosed in mg of elemental calcium) tablet 1,250 mg, 1,250 mg, Oral, Daily, Ivor Costa, MD, 1,250 mg at 11/20/20 1036   dextromethorphan-guaiFENesin (Ramos DM) 30-600 MG per  12 hr tablet 1 tablet, 1 tablet, Oral, BID PRN, Ivor Costa, MD   heparin injection 5,000 Units, 5,000 Units, Subcutaneous, Q8H, Ivor Costa, MD, 5,000 Units at 11/20/20 0536   hydrALAZINE (APRESOLINE) injection 5 mg, 5 mg, Intravenous, Q2H PRN, Ivor Costa, MD   ipratropium (ATROVENT) nebulizer solution 0.5 mg, 0.5 mg, Nebulization, Q6H, Ivor Costa, MD   levalbuterol (XOPENEX) nebulizer solution 0.63 mg, 0.63 mg, Nebulization, Q6H PRN, Ivor Costa, MD   levofloxacin (LEVAQUIN) IVPB 750 mg, 750 mg, Intravenous, Q24H, Fritzi Mandes, MD   loratadine (CLARITIN) tablet 10 mg, 10 mg, Oral, Daily, Ivor Costa, MD, 10 mg at 11/20/20 1035   mometasone-formoterol (DULERA) 200-5 MCG/ACT inhaler 2 puff, 2 puff, Inhalation, BID, Ivor Costa, MD, 2 puff at 11/20/20 0709   montelukast (SINGULAIR) tablet 10 mg, 10 mg, Oral, QHS, Niu, Soledad Gerlach, MD, 10 mg at 11/19/20 2022   morphine 2 MG/ML injection 1 mg, 1 mg, Intravenous, Q4H PRN, Fritzi Mandes, MD   nortriptyline (PAMELOR) capsule 20 mg, 20 mg, Oral, Daily, Ivor Costa, MD, 20 mg at 11/19/20 2044   ondansetron (ZOFRAN) injection 4 mg, 4 mg, Intravenous, Q8H PRN, Ivor Costa, MD   oxyCODONE-acetaminophen (PERCOCET/ROXICET) 5-325  MG per tablet 1 tablet, 1 tablet, Oral, Q4H PRN, Ivor Costa, MD, 1 tablet at 11/20/20 1036   roflumilast (DALIRESP) tablet 500 mcg, 500 mcg, Oral, Daily, Ivor Costa, MD, 500 mcg at 11/20/20 1035   sodium chloride 0.9 % bolus 500 mL, 500 mL, Intravenous, Once, Ivor Costa, MD    ALLERGIES   Amoxicillin, Prednisone, Doxycycline, and Metoprolol     REVIEW OF SYSTEMS    Review of Systems:  Gen:  Denies  fever, sweats, chills weigh loss  HEENT: Denies blurred vision, double vision, ear pain, eye pain, hearing loss, nose bleeds, sore throat Cardiac:  No dizziness, chest pain or heaviness, chest tightness,edema Resp:   Denies cough or sputum porduction, shortness of breath,wheezing, hemoptysis,  Gi: Denies swallowing difficulty, stomach pain,  nausea or vomiting, diarrhea, constipation, bowel incontinence Gu:  Denies bladder incontinence, burning urine Ext:   Denies Joint pain, stiffness or swelling Skin: Denies  skin rash, easy bruising or bleeding or hives Endoc:  Denies polyuria, polydipsia , polyphagia or weight change Psych:   Denies depression, insomnia or hallucinations   Other:  All other systems negative   VS: BP 123/70 (BP Location: Left Arm)   Pulse (!) 103   Temp 97.7 F (36.5 C)   Resp 16   Ht 5' 2.5" (1.588 m)   Wt 98.6 kg   SpO2 96%   BMI 39.12 kg/m      PHYSICAL EXAM    GENERAL:NAD, no fevers, chills, no weakness no fatigue, less dyspnea HEAD: Normocephalic, atraumatic.  EYES: Pupils equal, round, reactive to light. Extraocular muscles intact. No scleral icterus.  MOUTH: Moist mucosal membrane. Dentition intact. No abscess noted.  EAR, NOSE, THROAT: Clear without exudates. No external lesions.  NECK: Supple. No thyromegaly. No nodules. No JVD.  PULMONARY: clearer +wheezes CARDIOVASCULAR: S1 and S2. Regular rate and rhythm. No murmurs, rubs, or gallops. No edema. Pedal pulses 2+ bilaterally.  GASTROINTESTINAL: Soft, nontender, nondistended. No masses. Positive bowel sounds. No hepatosplenomegaly.  MUSCULOSKELETAL: No swelling, clubbing, or edema. Range of motion full in all extremities.  NEUROLOGIC: Cranial nerves II through XII are intact. No gross focal neurological deficits. Sensation intact. Reflexes intact.  SKIN: No ulceration, lesions, rashes, or cyanosis. Skin warm and dry. Turgor intact.  PSYCHIATRIC: Mood, affect within normal limits. The patient is awake, alert and oriented x 3. Insight, judgment intact.       IMAGING    CT Angio Chest PE W and/or Wo Contrast  Result Date: 11/19/2020 CLINICAL DATA:  Pain in the right lateral chest, shortness of breath EXAM: CT ANGIOGRAPHY CHEST WITH CONTRAST TECHNIQUE: Multidetector CT imaging of the chest was performed using the standard protocol  during bolus administration of intravenous contrast. Multiplanar CT image reconstructions and MIPs were obtained to evaluate the vascular anatomy. CONTRAST:  51mL OMNIPAQUE IOHEXOL 350 MG/ML SOLN COMPARISON:  10/19/2020 FINDINGS: Cardiovascular: Satisfactory opacification of the pulmonary arteries to the segmental level. No evidence of pulmonary embolism. Normal heart size. No pericardial effusion. Coronary artery and aortic calcifications. Mediastinum/Nodes: Debris in the trachea and both mainstem bronchi, with evidence of distal mucus plugging. No enlarged mediastinal, hilar, or axillary lymph nodes. Thyroid gland and esophagus demonstrate no significant findings. Lungs/Pleura: Redemonstrated irregular, thick-walled mass in the superior left lower lobe, which measures up to 3.2 x 2.7 x 2.6 cm (series 5, image 151 and series 9, image 58), unchanged compared to the prior exam. Centrilobular emphysema. Mild peribronchial thickening. Peribronchovascular consolidation nodularity in the right middle lobe, which is  increased compared to 10/19/2020. Similar consolidation is also noted in the right greater than left dependent lower lobes. No pleural effusion. Upper Abdomen: No acute abnormality. Musculoskeletal: No chest wall abnormality. No acute or significant osseous findings. Review of the MIP images confirms the above findings. IMPRESSION: 1. Negative for pulmonary embolism. 2. Redemonstrated cavitary lesion in the superior left lower lobe, which remains concerning for bronchogenic carcinoma. 3. Increased consolidation in the right middle lobe, with additional similar consolidative opacities in the right greater than left dependent lower lobes. This may be infectious or inflammatory. 4. Aortic Atherosclerosis (ICD10-I70.0) and Emphysema (ICD10-J43.9). Coronary atherosclerosis. Electronically Signed   By: Merilyn Baba M.D.   On: 11/19/2020 13:17   US Venous Img Lower Unilateral Left (DVT)  Result Date:  10/29/2020 CLINICAL DATA:  Left lower extremity edema. EXAM: LEFT LOWER EXTREMITY VENOUS DOPPLER ULTRASOUND TECHNIQUE: Gray-scale sonography with graded compression, as well as color Doppler and duplex ultrasound were performed to evaluate the lower extremity deep venous systems from the level of the common femoral vein and including the common femoral, femoral, profunda femoral, popliteal and calf veins including the posterior tibial, peroneal and gastrocnemius veins when visible. The superficial great saphenous vein was also interrogated. Spectral Doppler was utilized to evaluate flow at rest and with distal augmentation maneuvers in the common femoral, femoral and popliteal veins. COMPARISON:  None. FINDINGS: Contralateral Common Femoral Vein: Respiratory phasicity is normal and symmetric with the symptomatic side. No evidence of thrombus. Normal compressibility. Common Femoral Vein: No evidence of thrombus. Normal compressibility, respiratory phasicity and response to augmentation. Saphenofemoral Junction: No evidence of thrombus. Normal compressibility and flow on color Doppler imaging. Profunda Femoral Vein: No evidence of thrombus. Normal compressibility and flow on color Doppler imaging. Femoral Vein: No evidence of thrombus. Normal compressibility, respiratory phasicity and response to augmentation. Popliteal Vein: No evidence of thrombus. Normal compressibility, respiratory phasicity and response to augmentation. Calf Veins: No evidence of thrombus. Normal compressibility and flow on color Doppler imaging. Superficial Great Saphenous Vein: No evidence of thrombus. Normal compressibility. Venous Reflux:  None. Other Findings: No evidence of superficial thrombophlebitis or abnormal fluid collection. IMPRESSION: No evidence of left lower extremity deep venous thrombosis. Electronically Signed   By: Aletta Edouard M.D.   On: 10/29/2020 11:25   DG Chest Port 1 View  Result Date: 11/19/2020 CLINICAL DATA:   55 year old female with right lateral chest pain and increased shortness of breath since yesterday. EXAM: PORTABLE CHEST 1 VIEW COMPARISON:  Chest CT 10/19/2020 and earlier. FINDINGS: Portable AP semi upright view at 1013 hours. Emphysema demonstrated by CT last month. Unresolved and mildly progressed reticulonodular and patchy opacity at the right lung base, involving the right middle lobe on the prior CT. Left lower lobe superior segment cavitary lesion remains occult by portable chest. Streaky left lung base opacity has mildly increased. Stable cardiac size and mediastinal contours. Visualized tracheal air column is within normal limits. No pneumothorax or pleural effusion. IMPRESSION: 1. Unresolved and mildly progressed right middle lobe airspace disease since the CT last month. Bronchopneumonia was favored at that time. 2. Cavitary lesion of the superior segment left lower lobe is occult by portable chest - please see prior CT report. 3. Emphysema. Electronically Signed   By: Genevie Ann M.D.   On: 11/19/2020 10:42      ASSESSMENT/PLAN   This is a lady a saw resecently in the office. She is known to have very severe copd ( fev1 0.43 liters). Came is wirt acute  on chronic dyspnea, low sats. Noted to have RLL pneumonia, and a LLL mass. No change ( MASS) on repeat ct from the one of last month. No pulmonary embolism. We discussed bx then and now and she is refusing biopsy. She is not a surgical candidate due to her advanced copd. We discussed then and now xrt ( sbrt). She was to decide. Oncology has seen. Pet scan is scheduled as an out patient. Further order per above -Following with oncology     The plan is the treat her copd exacerbation as you are doing. She is better since the recent neb. Her heart rate is down to 115/min . She is refusing steroid iv or po ( lead to transfusion, not clear of the relationship with steroids). Hence, if need be we could add theodur 300 mg bid. Since she is improving will  hold off -dvt prophylaxis      Secondly, treat what now looks like pneumonia. (Rocephin/zithro or the equivalent such as levaquin) -Continue as ordered   Thank you for allowing me to participate in the care of this patient.   Patient/Family are satisfied with care plan and all questions have been answered.  This document was prepared using Dragon voice recognition software and may include unintentional dictation errors.     Wallene Huh, M.D.  Div[facility]

## 2020-11-20 NOTE — Progress Notes (Signed)
Stephanie Ross NAME: Stephanie Ross    MR#:  [id number]  DATE OF BIRTH:  30-Jan-1966  SUBJECTIVE:  patient came in with increasing shortness of breath. She  has severe COPD. No fever. REVIEW OF SYSTEMS:   Review of Systems  Constitutional:  Negative for chills, fever and weight loss.  HENT:  Negative for ear discharge, ear pain and nosebleeds.   Eyes:  Negative for blurred vision, pain and discharge.  Respiratory:  Positive for cough, sputum production and shortness of breath. Negative for wheezing and stridor.   Cardiovascular:  Negative for chest pain, palpitations, orthopnea and PND.  Gastrointestinal:  Negative for abdominal pain, diarrhea, nausea and vomiting.  Genitourinary:  Negative for frequency and urgency.  Musculoskeletal:  Negative for back pain and joint pain.  Neurological:  Positive for weakness. Negative for sensory change, speech change and focal weakness.  Psychiatric/Behavioral:  Negative for depression and hallucinations. The patient is not nervous/anxious.   Tolerating Diet:yes Tolerating PT:   DRUG ALLERGIES:   Allergies  Allergen Reactions   Amoxicillin Other (See Comments)    Has patient had a PCN reaction causing immediate rash, facial/tongue/throat swelling, SOB or lightheadedness with hypotension: Yes Has patient had a PCN reaction causing severe rash involving mucus membranes or skin necrosis: Yes Has patient had a PCN reaction that required hospitalization:yes Has patient had a PCN reaction occurring within the last 10 years: yes If all of the above answers are "NO", then may proceed with Cephalosporin use.    Prednisone Other (See Comments)    Full body rash    Doxycycline Other (See Comments)    Other reaction(s): Other (See Comments) "caused fluid in lungs" "caused fluid in lungs"    Metoprolol     Other reaction(s): Dizziness    VITALS:  Blood pressure 123/70, pulse (!) 103,  temperature 97.7 F (36.5 C), resp. rate 16, height 5' 2.5" (1.588 m), weight 98.6 kg, SpO2 96 %.  PHYSICAL EXAMINATION:   Physical Exam  GENERAL:  55 y.o.-year-old patient lying in the bed with no acute distress. Looks ill chronically LUNGS: coarse breath sounds bilaterally, no wheezing, rales, rhonchi. No use of accessory muscles of respiration.  CARDIOVASCULAR: S1, S2 normal. No murmurs, rubs, or gallops.  ABDOMEN: Soft, nontender, nondistended. Bowel sounds present. No organomegaly or mass.  EXTREMITIES: No cyanosis, clubbing or edema b/l.    NEUROLOGIC: non focal PSYCHIATRIC:  patient is alert and oriented x 3.  SKIN: No obvious rash, lesion, or ulcer.   LABORATORY PANEL:  CBC Recent Labs  Lab 11/20/20 0545  WBC 18.3*  HGB 10.9*  HCT 33.8*  PLT 242    Chemistries  Recent Labs  Lab 11/19/20 1041 11/20/20 0545  NA  --  137  K  --  4.6  CL  --  101  CO2  --  30  GLUCOSE  --  160*  BUN  --  12  CREATININE  --  0.59  CALCIUM  --  8.8*  MG  --  2.0  AST 16  --   ALT 17  --   ALKPHOS 119  --   BILITOT 1.2  --    Cardiac Enzymes No results for input(s): TROPONINI in the last 168 hours. RADIOLOGY:  CT Angio Chest PE W and/or Wo Contrast  Result Date: 11/19/2020 CLINICAL DATA:  Pain in the right lateral chest, shortness of breath EXAM: CT ANGIOGRAPHY CHEST WITH CONTRAST TECHNIQUE: Multidetector CT  imaging of the chest was performed using the standard protocol during bolus administration of intravenous contrast. Multiplanar CT image reconstructions and MIPs were obtained to evaluate the vascular anatomy. CONTRAST:  51mL OMNIPAQUE IOHEXOL 350 MG/ML SOLN COMPARISON:  10/19/2020 FINDINGS: Cardiovascular: Satisfactory opacification of the pulmonary arteries to the segmental level. No evidence of pulmonary embolism. Normal heart size. No pericardial effusion. Coronary artery and aortic calcifications. Mediastinum/Nodes: Debris in the trachea and both mainstem bronchi, with  evidence of distal mucus plugging. No enlarged mediastinal, hilar, or axillary lymph nodes. Thyroid gland and esophagus demonstrate no significant findings. Lungs/Pleura: Redemonstrated irregular, thick-walled mass in the superior left lower lobe, which measures up to 3.2 x 2.7 x 2.6 cm (series 5, image 151 and series 9, image 58), unchanged compared to the prior exam. Centrilobular emphysema. Mild peribronchial thickening. Peribronchovascular consolidation nodularity in the right middle lobe, which is increased compared to 10/19/2020. Similar consolidation is also noted in the right greater than left dependent lower lobes. No pleural effusion. Upper Abdomen: No acute abnormality. Musculoskeletal: No chest wall abnormality. No acute or significant osseous findings. Review of the MIP images confirms the above findings. IMPRESSION: 1. Negative for pulmonary embolism. 2. Redemonstrated cavitary lesion in the superior left lower lobe, which remains concerning for bronchogenic carcinoma. 3. Increased consolidation in the right middle lobe, with additional similar consolidative opacities in the right greater than left dependent lower lobes. This may be infectious or inflammatory. 4. Aortic Atherosclerosis (ICD10-I70.0) and Emphysema (ICD10-J43.9). Coronary atherosclerosis. Electronically Signed   By: Merilyn Baba M.D.   On: 11/19/2020 13:17   DG Chest Port 1 View  Result Date: 11/19/2020 CLINICAL DATA:  56 year old female with right lateral chest pain and increased shortness of breath since yesterday. EXAM: PORTABLE CHEST 1 VIEW COMPARISON:  Chest CT 10/19/2020 and earlier. FINDINGS: Portable AP semi upright view at 1013 hours. Emphysema demonstrated by CT last month. Unresolved and mildly progressed reticulonodular and patchy opacity at the right lung base, involving the right middle lobe on the prior CT. Left lower lobe superior segment cavitary lesion remains occult by portable chest. Streaky left lung base  opacity has mildly increased. Stable cardiac size and mediastinal contours. Visualized tracheal air column is within normal limits. No pneumothorax or pleural effusion. IMPRESSION: 1. Unresolved and mildly progressed right middle lobe airspace disease since the CT last month. Bronchopneumonia was favored at that time. 2. Cavitary lesion of the superior segment left lower lobe is occult by portable chest - please see prior CT report. 3. Emphysema. Electronically Signed   By: Genevie Ann M.D.   On: 11/19/2020 10:42   ASSESSMENT AND PLAN:   Stephanie Ross is a 55 y.o. female with medical history significant of hypertension, hyperlipidemia, COPD on 2 L oxygen, asthma, stroke, pressure, aortic valve regurgitation, known lung mass, who presents with shortness breath.  Patient states that she has severe COPD and chronic shortness breath, which has progressively worsened since yesterday.  She has cough with little yellow sputum production   CTA of chest showed: 1. Negative for pulmonary embolism. 2. Redemonstrated cavitary lesion in the superior left lower lobe, which remains concerning for bronchogenic carcinoma. 3. Increased consolidation in the right middle lobe, with additional similar consolidative opacities in the right greater than left dependent lower lobes. This may be infectious or inflammatory. 4. Aortic Atherosclerosis  sepsis present on admission secondary to pneumonia -- came in with leukocytosis tachycardia and tachypnea -- patient has history of chronic tachycardia. -- sepsis improving -- change  to IV Levaquin discussed with Dr. Raul Del -- blood culture negative  acute on chronic hypoxic respiratory failure secondary to severe emphysema/COPD exacerbation -- patient does not want Solu-Medrol due to some reaction in the past -- continue nebulizer breathing treatment  lung mass -- patient has cavitary left lower lobe mass suspect bronchial carcinoma with history of smoking. She is  supposed to follow-up with Dr. Grayland Ormond of oncology  Hyperlipidemia -- statins  Hypertension -- continue amlodipine  history of depression on nortriptyline   Procedures: Family communication : patient says she will inform her family Consults : pulmonary, oncology CODE STATUS: DNR. This was discussed with patient in the room today. DVT Prophylaxis : enoxaparin Level of care: Progressive Cardiac Status is: Inpatient  Remains inpatient appropriate because:Inpatient level of care appropriate due to severity of illness  Dispo: The patient is from: Home              Anticipated d/c is to: Home              Patient currently is not medically stable to d/c.   Difficult to place patient No        TOTAL TIME TAKING CARE OF THIS PATIENT: 25 minutes.  >50% time spent on counselling and coordination of care  Note: This dictation was prepared with Dragon dictation along with smaller phrase technology. Any transcriptional errors that result from this process are unintentional.  Fritzi Mandes M.D    [facility]   CC: Primary care physician; [facility] Patient ID: Stephanie Ross, female   DOB: 1965-11-14, 55 y.o.   MRN: [id number]

## 2020-11-21 LAB — BASIC METABOLIC PANEL
Anion gap: 6 (ref 5–15)
BUN: 12 mg/dL (ref 6–20)
CO2: 35 mmol/L — ABNORMAL HIGH (ref 22–32)
Calcium: 8.8 mg/dL — ABNORMAL LOW (ref 8.9–10.3)
Chloride: 91 mmol/L — ABNORMAL LOW (ref 98–111)
Creatinine, Ser: 0.62 mg/dL (ref 0.44–1.00)
GFR, Estimated: >60 mL/min (ref >60)
Glucose, Bld: 159 mg/dL — ABNORMAL HIGH (ref 70–99)
Potassium: 4.4 mmol/L (ref 3.5–5.1)
Sodium: 132 mmol/L — ABNORMAL LOW (ref 135–145)

## 2020-11-21 LAB — PROCALCITONIN: Procalcitonin: 0.15 ng/mL

## 2020-11-21 MED ORDER — LEVOFLOXACIN 750 MG PO TABS: 750.0000 mg | ORAL_TABLET | Freq: Every day | ORAL | 0 refills | Status: AC

## 2020-11-21 MED ORDER — DM-GUAIFENESIN ER 30-600 MG PO TB12: 1.0000 | ORAL_TABLET | Freq: Two times a day (BID) | ORAL | 0 refills | Status: AC

## 2020-11-21 MED ORDER — NORTRIPTYLINE HCL 10 MG PO CAPS
20.0000 mg | ORAL_CAPSULE | Freq: Three times a day (TID) | ORAL | Status: DC | PRN
  Administered 2020-11-21: 20 mg via ORAL
  Filled 2020-11-21 (×2): qty 2

## 2020-11-21 MED ORDER — SODIUM CHLORIDE 0.9 % IV SOLN
INTRAVENOUS | Status: DC | PRN
  Administered 2020-11-21: 10 mL/h via INTRAVENOUS

## 2020-11-21 MED ORDER — LEVOFLOXACIN 500 MG PO TABS: 750.0000 mg | ORAL_TABLET | Freq: Every day | ORAL | Status: DC

## 2020-11-21 NOTE — Discharge Summary (Signed)
[facility] NAME: Stephanie Ross    MR#:  [id number]  DATE OF BIRTH:  1965/07/03  DATE OF ADMISSION:  11/19/2020 ADMITTING PHYSICIAN: Stephanie Costa, MD  DATE OF DISCHARGE: 11/21/2020  PRIMARY CARE PHYSICIAN: Stephanie Ross    ADMISSION DIAGNOSIS:  Bronchopneumonia [J18.0] SOB (shortness of breath) [R06.02] Atypical chest pain [R07.89] Recurrent pneumonia [J18.9] Acute respiratory failure with hypoxia (HCC) [J96.01] Malignant neoplasm of left lung, unspecified part of lung (HCC) [C34.92] Sepsis, due to unspecified organism, unspecified whether acute organ dysfunction present ([place]) [A41.9]  DISCHARGE DIAGNOSIS:  Sepsis due to Lobar pneumonia Lung mass--w/u as out pt acute on chronic respiratory failure with hypoxia secondary to COPD exacerbation. Patient on chronic home oxygen  SECONDARY DIAGNOSIS:   Past Medical History:  Diagnosis Date   Asthma    Cancer ([place])    COPD (chronic obstructive pulmonary disease) ([place])    Hypertension     HOSPITAL COURSE:   Stephanie Ross is a 55 y.o. female with medical history significant of hypertension, hyperlipidemia, COPD on 2 L oxygen, asthma, stroke, pressure, aortic valve regurgitation, known lung mass, who presents with shortness breath.  Patient states that she has severe COPD and chronic shortness breath, which has progressively worsened since yesterday.  She has cough with little yellow sputum production     CTA of chest showed: 1. Negative for pulmonary embolism. 2. Redemonstrated cavitary lesion in Stephanie superior left lower lobe, which remains concerning for bronchogenic carcinoma. 3. Increased consolidation in Stephanie right middle lobe, with additional similar consolidative opacities in Stephanie right greater than left dependent lower lobes. This may be infectious or inflammatory. 4. Aortic Atherosclerosis   sepsis present on admission secondary to  pneumonia -- came in with leukocytosis tachycardia and tachypnea -- patient has history of chronic tachycardia. -- sepsis improving -- change to IV Levaquin discussed with Stephanie Ross -- blood culture negative -- remains afebrile. White count trending down. Patient will finish a course of Levaquin as outpatient. Should follow-up with Stephanie Ross    acute on chronic hypoxic respiratory failure secondary to severe emphysema/COPD exacerbation -- patient does not want Solu-Medrol due to some reaction in Stephanie past -- continue nebulizer breathing treatment -- sats more than 92% on 3 L nasal cannula. Patient wears chronic 2 to 3 L oxygen at home   lung mass -- patient has cavitary left lower lobe mass suspect bronchial carcinoma with history of smoking. She is supposed to follow-up with Stephanie Ross of oncology   Hyperlipidemia -- statins   Hypertension -- continue amlodipine   history of depression on nortriptyline     Procedures: Family communication : patient says she will inform her family Consults : pulmonary, oncology CODE STATUS: DNR. This was discussed with patient in Stephanie room today. DVT Prophylaxis : enoxaparin Level of care: Progressive Cardiac Status is: Inpatient     Dispo: Stephanie patient is from: Home              Anticipated d/c is to: Home              Patient currently is clinically improving and will discharged to home with outpatient follow-up oncology and pulmonary.             Difficult to place patient No     CONSULTS OBTAINED:  Treatment Team:  Stephanie Huger, MD  DRUG ALLERGIES:   Allergies  Allergen Reactions   Amoxicillin Other (See  Comments)    Has patient had a PCN reaction causing immediate rash, facial/tongue/throat swelling, SOB or lightheadedness with hypotension: Yes Has patient had a PCN reaction causing severe rash involving mucus membranes or skin necrosis: Yes Has patient had a PCN reaction that required hospitalization:yes Has patient  had a PCN reaction occurring within Stephanie last 10 years: yes If all of Stephanie above answers are "NO", then may proceed with Cephalosporin use.    Prednisone Other (See Comments)    Full body rash    Doxycycline Other (See Comments)    Other reaction(s): Other (See Comments) "caused fluid in lungs" "caused fluid in lungs"    Metoprolol     Other reaction(s): Dizziness    DISCHARGE MEDICATIONS:   Allergies as of 11/21/2020       Reactions   Amoxicillin Other (See Comments)   Has patient had a PCN reaction causing immediate rash, facial/tongue/throat swelling, SOB or lightheadedness with hypotension: Yes Has patient had a PCN reaction causing severe rash involving mucus membranes or skin necrosis: Yes Has patient had a PCN reaction that required hospitalization:yes Has patient had a PCN reaction occurring within Stephanie last 10 years: yes If all of Stephanie above answers are "NO", then may proceed with Cephalosporin use.   Prednisone Other (See Comments)   Full body rash    Doxycycline Other (See Comments)   Other reaction(s): Other (See Comments) "caused fluid in lungs" "caused fluid in lungs"   Metoprolol    Other reaction(s): Dizziness        Medication List     STOP taking these medications    azithromycin 250 MG tablet Commonly known as: Zithromax   doxycycline 100 MG tablet Commonly known as: ADOXA   Incruse Ellipta 62.5 MCG/INH Aepb Generic drug: umeclidinium bromide       TAKE these medications    albuterol 108 (90 Base) MCG/ACT inhaler Commonly known as: VENTOLIN HFA Inhale 2 puffs into Stephanie lungs every 4 (four) hours as needed for wheezing or shortness of breath.   amLODipine 5 MG tablet Commonly known as: NORVASC Take 5 mg by mouth at bedtime.   atorvastatin 20 MG tablet Commonly known as: LIPITOR Take 20 mg by mouth daily.   baclofen 10 MG tablet Commonly known as: LIORESAL Take 10 mg by mouth 3 (three) times daily.   budesonide-formoterol 160-4.5  MCG/ACT inhaler Commonly known as: SYMBICORT Inhale 2 puffs into Stephanie lungs 2 (two) times daily.   calcium carbonate 1250 (500 Ca) MG chewable tablet Commonly known as: OS-CAL Chew 500 mg by mouth daily.   celecoxib 200 MG capsule Commonly known as: CELEBREX Take 200 mg by mouth daily.   cetirizine 10 MG tablet Commonly known as: ZYRTEC Take 10 mg by mouth daily as needed for allergies.   clopidogrel 75 MG tablet Commonly known as: PLAVIX Take 75 mg by mouth daily.   CoQ10 100 MG Caps Take 100 mg by mouth daily.   Daliresp 500 MCG Tabs tablet Generic drug: roflumilast Take 500 mcg by mouth daily.   dextromethorphan-guaiFENesin 30-600 MG 12hr tablet Commonly known as: MUCINEX DM Take 1 tablet by mouth 2 (two) times daily for 7 days.   ergocalciferol 1.25 MG (50000 UT) capsule Commonly known as: VITAMIN D2 Take 50,000 Units by mouth every Monday.   furosemide 20 MG tablet Commonly known as: LASIX Take 20 mg by mouth daily.   levofloxacin 750 MG tablet Commonly known as: LEVAQUIN Take 1 tablet (750 mg total) by mouth daily  for 3 days. Start taking on: November 22, 2020   montelukast 10 MG tablet Commonly known as: SINGULAIR Take 10 mg by mouth at bedtime.   nortriptyline 10 MG capsule Commonly known as: PAMELOR Take 20 mg by mouth daily.   tiotropium 18 MCG inhalation capsule Commonly known as: SPIRIVA Place 1 capsule (18 mcg total) into inhaler and inhale daily.        If you experience worsening of your admission symptoms, develop shortness of breath, life threatening emergency, suicidal or homicidal thoughts you must seek medical attention immediately by calling 911 or calling your MD immediately  if symptoms less severe.  You Must read complete instructions/literature along with all Stephanie possible adverse reactions/side effects for all Stephanie Medicines you take and that have been prescribed to you. Take any new Medicines after you have completely understood and  accept all Stephanie possible adverse reactions/side effects.   Please note  You were cared for by a hospitalist during your hospital stay. If you have any questions about your discharge medications or Stephanie care you received while you were in Stephanie hospital after you are discharged, you can call Stephanie unit and asked to speak with Stephanie hospitalist on call if Stephanie hospitalist that took care of you is not available. Once you are discharged, your primary care physician will handle any further medical issues. Please note that NO REFILLS for any discharge medications will be authorized once you are discharged, as it is imperative that you return to your primary care physician (or establish a relationship with a primary care physician if you do not have one) for your aftercare needs so that they can reassess your need for medications and monitor your lab values. Today   SUBJECTIVE   breathing better. Tolerating PO diet. Currently on 3 L nasal cannula oxygen  VITAL SIGNS:  Blood pressure 132/63, pulse 86, temperature 98 F (36.7 C), resp. rate 17, height 5' 2.5" (1.588 m), weight 102.3 kg, SpO2 94 %.  I/O:   Intake/Output Summary (Last 24 hours) at 11/21/2020 1215 Last data filed at 11/21/2020 1138 Gross per 24 hour  Intake 1947.67 ml  Output 900 ml  Net 1047.67 ml    PHYSICAL EXAMINATION:  GENERAL:  56 y.o.-year-old patient lying in Stephanie bed with no acute distress.  LUNGS: distant breath sounds bilaterally, no wheezing, rales,occ rhonchi or crepitation. No use of accessory muscles of respiration.  CARDIOVASCULAR: S1, S2 normal. No murmurs, rubs, or gallops.  ABDOMEN: Soft, non-tender, non-distended. Bowel sounds present. No organomegaly or mass.  EXTREMITIES: No pedal edema, cyanosis, or clubbing.  NEUROLOGIC: Cranial nerves II through XII are intact. Muscle strength 5/5 in all extremities. Sensation intact. Gait not checked.  PSYCHIATRIC: Stephanie patient is alert and oriented x 3.  SKIN: No obvious rash,  lesion, or ulcer.   DATA REVIEW:   CBC  Recent Labs  Lab 11/20/20 0545  WBC 18.3*  HGB 10.9*  HCT 33.8*  PLT 242    Chemistries  Recent Labs  Lab 11/19/20 1041 11/20/20 0545 11/21/20 0459  NA  --  137 132*  K  --  4.6 4.4  CL  --  101 91*  CO2  --  30 35*  GLUCOSE  --  160* 159*  BUN  --  12 12  CREATININE  --  0.59 0.62  CALCIUM  --  8.8* 8.8*  MG  --  2.0  --   AST 16  --   --   ALT 17  --   --  ALKPHOS 119  --   --   BILITOT 1.2  --   --     Microbiology Results   Recent Results (from Stephanie past 240 hour(s))  Resp Panel by RT-PCR (Flu A&B, Covid) Nasopharyngeal Swab     Status: None   Collection Time: 11/19/20 10:40 AM   Specimen: Nasopharyngeal Swab; Nasopharyngeal(NP) swabs in vial transport medium  Result Value Ref Range Status   SARS Coronavirus 2 by RT PCR NEGATIVE NEGATIVE Final    Comment: (NOTE) SARS-CoV-2 target nucleic acids are NOT DETECTED.  Stephanie SARS-CoV-2 RNA is generally detectable in upper respiratory specimens during Stephanie acute phase of infection. Stephanie lowest concentration of SARS-CoV-2 viral copies this assay can detect is 138 copies/mL. A negative result does not preclude SARS-Cov-2 infection and should not be used as Stephanie sole basis for treatment or other patient management decisions. A negative result may occur with  improper specimen collection/handling, submission of specimen other than nasopharyngeal swab, presence of viral mutation(s) within Stephanie areas targeted by this assay, and inadequate number of viral copies(<138 copies/mL). A negative result must be combined with clinical observations, patient history, and epidemiological information. Stephanie expected result is Negative.  Fact Sheet for Patients:  EntrepreneurPulse.com.au  Fact Sheet for Healthcare Providers:  IncredibleEmployment.be  This test is no t yet approved or cleared by Stephanie [place] FDA and  has been authorized for detection  and/or diagnosis of SARS-CoV-2 by FDA under an Emergency Use Authorization (EUA). This EUA will remain  in effect (meaning this test can be used) for Stephanie duration of Stephanie COVID-19 declaration under Section 564(b)(1) of Stephanie Act, 21 U.S.C.section 360bbb-3(b)(1), unless Stephanie authorization is terminated  or revoked sooner.       Influenza A by PCR NEGATIVE NEGATIVE Final   Influenza B by PCR NEGATIVE NEGATIVE Final    Comment: (NOTE) Stephanie Xpert Xpress SARS-CoV-2/FLU/RSV plus assay is intended as an aid in Stephanie diagnosis of influenza from Nasopharyngeal swab specimens and should not be used as a sole basis for treatment. Nasal washings and aspirates are unacceptable for Xpert Xpress SARS-CoV-2/FLU/RSV testing.  Fact Sheet for Patients: EntrepreneurPulse.com.au  Fact Sheet for Healthcare Providers: IncredibleEmployment.be  This test is not yet approved or cleared by Stephanie [place] FDA and has been authorized for detection and/or diagnosis of SARS-CoV-2 by FDA under an Emergency Use Authorization (EUA). This EUA will remain in effect (meaning this test can be used) for Stephanie duration of Stephanie COVID-19 declaration under Section 564(b)(1) of Stephanie Act, 21 U.S.C. section 360bbb-3(b)(1), unless Stephanie authorization is terminated or revoked.  Performed at [facility], [address] [place], [place] [address]   Blood culture (routine x 2)     Status: None (Preliminary result)   Collection Time: 11/19/20 10:41 AM   Specimen: BLOOD LEFT FOREARM  Result Value Ref Range Status   Specimen Description BLOOD LEFT FOREARM  Final   Special Requests   Final    BOTTLES DRAWN AEROBIC AND ANAEROBIC Blood Culture adequate volume   Culture   Final    NO GROWTH 2 DAYS Performed at [facility], [address], [place], [place] [address]    Report Status PENDING  Incomplete  Blood culture (routine x 2)     Status: None (Preliminary result)    Collection Time: 11/19/20 10:41 AM   Specimen: Left Antecubital; Blood  Result Value Ref Range Status   Specimen Description LEFT ANTECUBITAL  Final   Special Requests   Final  BOTTLES DRAWN AEROBIC AND ANAEROBIC Blood Culture adequate volume   Culture   Final    NO GROWTH 2 DAYS Performed at [facility], [address] [place], [place] [address]    Report Status PENDING  Incomplete    RADIOLOGY:  CT Angio Chest PE W and/or Wo Contrast  Result Date: 11/19/2020 CLINICAL DATA:  Pain in Stephanie right lateral chest, shortness of breath EXAM: CT ANGIOGRAPHY CHEST WITH CONTRAST TECHNIQUE: Multidetector CT imaging of Stephanie chest was performed using Stephanie standard protocol during bolus administration of intravenous contrast. Multiplanar CT image reconstructions and MIPs were obtained to evaluate Stephanie vascular anatomy. CONTRAST:  28mL OMNIPAQUE IOHEXOL 350 MG/ML SOLN COMPARISON:  10/19/2020 FINDINGS: Cardiovascular: Satisfactory opacification of Stephanie pulmonary arteries to Stephanie segmental level. No evidence of pulmonary embolism. Normal heart size. No pericardial effusion. Coronary artery and aortic calcifications. Mediastinum/Nodes: Debris in Stephanie trachea and both mainstem bronchi, with evidence of distal mucus plugging. No enlarged mediastinal, hilar, or axillary lymph nodes. Thyroid gland and esophagus demonstrate no significant findings. Lungs/Pleura: Redemonstrated irregular, thick-walled mass in Stephanie superior left lower lobe, which measures up to 3.2 x 2.7 x 2.6 cm (series 5, image 151 and series 9, image 58), unchanged compared to Stephanie prior exam. Centrilobular emphysema. Mild peribronchial thickening. Peribronchovascular consolidation nodularity in Stephanie right middle lobe, which is increased compared to 10/19/2020. Similar consolidation is also noted in Stephanie right greater than left dependent lower lobes. No pleural effusion. Upper Abdomen: No acute abnormality. Musculoskeletal: No chest wall abnormality.  No acute or significant osseous findings. Review of Stephanie MIP images confirms Stephanie above findings. IMPRESSION: 1. Negative for pulmonary embolism. 2. Redemonstrated cavitary lesion in Stephanie superior left lower lobe, which remains concerning for bronchogenic carcinoma. 3. Increased consolidation in Stephanie right middle lobe, with additional similar consolidative opacities in Stephanie right greater than left dependent lower lobes. This may be infectious or inflammatory. 4. Aortic Atherosclerosis (ICD10-I70.0) and Emphysema (ICD10-J43.9). Coronary atherosclerosis. Electronically Signed   By: Merilyn Baba M.D.   On: 11/19/2020 13:17     CODE STATUS:     Code Status Orders  (From admission, onward)           Start     Ordered   11/20/20 1736  Full code  Continuous        11/20/20 1735           Code Status History     Date Active Date Inactive Code Status Order ID Comments User Context   11/20/2020 1447 11/20/2020 1735 DNR [id number]  Fritzi Mandes, MD Inpatient   11/19/2020 1825 11/20/2020 1447 Full Code [id number]  Stephanie Costa, MD Inpatient   10/14/2019 2213 10/18/2019 1804 Full Code [id number]  [name], MD ED   08/17/2019 0209 08/18/2019 2330 Full Code [id number]  Mansy, Arvella Merles, MD ED        TOTAL TIME TAKING CARE OF THIS PATIENT: 40 minutes.    Fritzi Mandes M.D  [facility]    CC: Primary care physician; Stephanie [place]

## 2020-11-21 NOTE — Discharge Instructions (Signed)
Use your oxygen, inhalers and nebulizer as before. °

## 2020-11-21 NOTE — Progress Notes (Signed)
Patient discharged to home.  Verbalizes understanding of discharge instructions.

## 2020-11-23 LAB — LEGIONELLA PNEUMOPHILA SEROGP 1 UR AG: L. pneumophila Serogp 1 Ur Ag: NEGATIVE

## 2020-11-24 LAB — CULTURE, BLOOD (ROUTINE X 2)
Culture: NO GROWTH
Culture: NO GROWTH
Special Requests: ADEQUATE
Special Requests: ADEQUATE

## 2020-12-04 NOTE — Progress Notes (Deleted)
[place]  Telephone:(336) [phone] Fax:(336) [phone]  ID: Stephanie Ross OB: [birth year]  MR#: [id number]  QHU#:[id number]  Patient Care Team: [facility] as PCP - General  CHIEF COMPLAINT: Left lower lobe lung mass.  INTERVAL HISTORY: ***  REVIEW OF SYSTEMS:   ROS  As per HPI. Otherwise, a complete review of systems is negative.  PAST MEDICAL HISTORY: Past Medical History:  Diagnosis Date   Asthma    Cancer ([place])    COPD (chronic obstructive pulmonary disease) ([place])    Hypertension     PAST SURGICAL HISTORY: Past Surgical History:  Procedure Laterality Date   BILATERAL CARPAL TUNNEL RELEASE     CERVICAL ABLATION     ROTATOR CUFF REPAIR Right     FAMILY HISTORY: Family History  Problem Relation Age of Onset   Breast cancer Sister    Breast cancer Paternal Grandmother     ADVANCED DIRECTIVES (Y/N):  N  HEALTH MAINTENANCE: Social History   Tobacco Use   Smoking status: Former    Packs/day: 0.25    Types: Cigarettes    Quit date: 07/17/2019    Years since quitting: 1.3   Smokeless tobacco: Never  Vaping Use   Vaping Use: Never used  Substance Use Topics   Alcohol use: No   Drug use: Yes    Types: Marijuana    Comment: occasionally     Colonoscopy:  PAP:  Bone density:  Lipid panel:  Allergies  Allergen Reactions   Amoxicillin Other (See Comments)    Has patient had a PCN reaction causing immediate rash, facial/tongue/throat swelling, SOB or lightheadedness with hypotension: Yes Has patient had a PCN reaction causing severe rash involving mucus membranes or skin necrosis: Yes Has patient had a PCN reaction that required hospitalization:yes Has patient had a PCN reaction occurring within the last 10 years: yes If all of the above answers are "NO", then may proceed with Cephalosporin use.    Prednisone Other (See Comments)    Full body rash    Doxycycline Other (See Comments)    Other  reaction(s): Other (See Comments) "caused fluid in lungs" "caused fluid in lungs"    Metoprolol     Other reaction(s): Dizziness    Current Outpatient Medications  Medication Sig Dispense Refill   albuterol (PROVENTIL HFA;VENTOLIN HFA) 108 (90 Base) MCG/ACT inhaler Inhale 2 puffs into the lungs every 4 (four) hours as needed for wheezing or shortness of breath. 1 Inhaler 3   amLODipine (NORVASC) 5 MG tablet Take 5 mg by mouth at bedtime.      atorvastatin (LIPITOR) 20 MG tablet Take 20 mg by mouth daily.     baclofen (LIORESAL) 10 MG tablet Take 10 mg by mouth 3 (three) times daily.     budesonide-formoterol (SYMBICORT) 160-4.5 MCG/ACT inhaler Inhale 2 puffs into the lungs 2 (two) times daily.     calcium carbonate (OS-CAL) 1250 (500 Ca) MG chewable tablet Chew 500 mg by mouth daily.     celecoxib (CELEBREX) 200 MG capsule Take 200 mg by mouth daily.     cetirizine (ZYRTEC) 10 MG tablet Take 10 mg by mouth daily as needed for allergies.      clopidogrel (PLAVIX) 75 MG tablet Take 75 mg by mouth daily.     Coenzyme Q10 (COQ10) 100 MG CAPS Take 100 mg by mouth daily.      DALIRESP 500 MCG TABS tablet Take 500 mcg by mouth daily.  ergocalciferol (VITAMIN D2) 1.25 MG (50000 UT) capsule Take 50,000 Units by mouth every Monday.     furosemide (LASIX) 20 MG tablet Take 20 mg by mouth daily.     montelukast (SINGULAIR) 10 MG tablet Take 10 mg by mouth at bedtime.     nortriptyline (PAMELOR) 10 MG capsule Take 20 mg by mouth daily.     tiotropium (SPIRIVA) 18 MCG inhalation capsule Place 1 capsule (18 mcg total) into inhaler and inhale daily. 30 capsule 12   No current facility-administered medications for this visit.    OBJECTIVE: There were no vitals filed for this visit.   There is no height or weight on file to calculate BMI.    ECOG FS:{CHL ONC [id number]  General: Well-developed, well-nourished, no acute distress. Eyes: Pink conjunctiva, anicteric sclera. HEENT: Normocephalic,  moist mucous membranes. Lungs: No audible wheezing or coughing. Heart: Regular rate and rhythm. Abdomen: Soft, nontender, no obvious distention. Musculoskeletal: No edema, cyanosis, or clubbing. Neuro: Alert, answering all questions appropriately. Cranial nerves grossly intact. Skin: No rashes or petechiae noted. Psych: Normal affect. Lymphatics: No cervical, calvicular, axillary or inguinal LAD.   LAB RESULTS:  Lab Results  Component Value Date   NA 132 (L) 11/21/2020   K 4.4 11/21/2020   CL 91 (L) 11/21/2020   CO2 35 (H) 11/21/2020   GLUCOSE 159 (H) 11/21/2020   BUN 12 11/21/2020   CREATININE 0.62 11/21/2020   CALCIUM 8.8 (L) 11/21/2020   PROT 8.1 11/19/2020   ALBUMIN 3.7 11/19/2020   AST 16 11/19/2020   ALT 17 11/19/2020   ALKPHOS 119 11/19/2020   BILITOT 1.2 11/19/2020   GFRNONAA >60 11/21/2020   GFRAA >60 10/15/2019    Lab Results  Component Value Date   WBC 18.3 (H) 11/20/2020   NEUTROABS 7.2 10/06/2020   HGB 10.9 (L) 11/20/2020   HCT 33.8 (L) 11/20/2020   MCV 85.1 11/20/2020   PLT 242 11/20/2020     STUDIES: CT Angio Chest PE W and/or Wo Contrast  Result Date: 11/19/2020 CLINICAL DATA:  Pain in the right lateral chest, shortness of breath EXAM: CT ANGIOGRAPHY CHEST WITH CONTRAST TECHNIQUE: Multidetector CT imaging of the chest was performed using the standard protocol during bolus administration of intravenous contrast. Multiplanar CT image reconstructions and MIPs were obtained to evaluate the vascular anatomy. CONTRAST:  50mL OMNIPAQUE IOHEXOL 350 MG/ML SOLN COMPARISON:  10/19/2020 FINDINGS: Cardiovascular: Satisfactory opacification of the pulmonary arteries to the segmental level. No evidence of pulmonary embolism. Normal heart size. No pericardial effusion. Coronary artery and aortic calcifications. Mediastinum/Nodes: Debris in the trachea and both mainstem bronchi, with evidence of distal mucus plugging. No enlarged mediastinal, hilar, or axillary lymph  nodes. Thyroid gland and esophagus demonstrate no significant findings. Lungs/Pleura: Redemonstrated irregular, thick-walled mass in the superior left lower lobe, which measures up to 3.2 x 2.7 x 2.6 cm (series 5, image 151 and series 9, image 58), unchanged compared to the prior exam. Centrilobular emphysema. Mild peribronchial thickening. Peribronchovascular consolidation nodularity in the right middle lobe, which is increased compared to 10/19/2020. Similar consolidation is also noted in the right greater than left dependent lower lobes. No pleural effusion. Upper Abdomen: No acute abnormality. Musculoskeletal: No chest wall abnormality. No acute or significant osseous findings. Review of the MIP images confirms the above findings. IMPRESSION: 1. Negative for pulmonary embolism. 2. Redemonstrated cavitary lesion in the superior left lower lobe, which remains concerning for bronchogenic carcinoma. 3. Increased consolidation in the right middle lobe, with additional similar  consolidative opacities in the right greater than left dependent lower lobes. This may be infectious or inflammatory. 4. Aortic Atherosclerosis (ICD10-I70.0) and Emphysema (ICD10-J43.9). Coronary atherosclerosis. Electronically Signed   By: Merilyn Baba M.D.   On: 11/19/2020 13:17   DG Chest Port 1 View  Result Date: 11/19/2020 CLINICAL DATA:  55 year old female with right lateral chest pain and increased shortness of breath since yesterday. EXAM: PORTABLE CHEST 1 VIEW COMPARISON:  Chest CT 10/19/2020 and earlier. FINDINGS: Portable AP semi upright view at 1013 hours. Emphysema demonstrated by CT last month. Unresolved and mildly progressed reticulonodular and patchy opacity at the right lung base, involving the right middle lobe on the prior CT. Left lower lobe superior segment cavitary lesion remains occult by portable chest. Streaky left lung base opacity has mildly increased. Stable cardiac size and mediastinal contours. Visualized  tracheal air column is within normal limits. No pneumothorax or pleural effusion. IMPRESSION: 1. Unresolved and mildly progressed right middle lobe airspace disease since the CT last month. Bronchopneumonia was favored at that time. 2. Cavitary lesion of the superior segment left lower lobe is occult by portable chest - please see prior CT report. 3. Emphysema. Electronically Signed   By: Genevie Ann M.D.   On: 11/19/2020 10:42    ASSESSMENT: Left lower lobe lung mass.  PLAN:    Left lower lobe lung mass:  Patient expressed understanding and was in agreement with this plan. She also understands that She can call clinic at any time with any questions, concerns, or complaints.   Cancer Staging No matching staging information was found for the patient.  Lloyd Huger, MD   12/04/2020 8:46 AM

## 2020-12-07 ENCOUNTER — Inpatient Hospital Stay: Payer: Medicare Other

## 2020-12-07 ENCOUNTER — Encounter: Payer: Self-pay | Admitting: *Deleted

## 2020-12-07 ENCOUNTER — Inpatient Hospital Stay: Payer: Medicare Other | Admitting: Oncology

## 2020-12-07 DIAGNOSIS — R918 Other nonspecific abnormal finding of lung field: Secondary | ICD-10-CM

## 2020-12-08 ENCOUNTER — Ambulatory Visit
Admission: RE | Admit: 2020-12-08 | Discharge: 2020-12-08 | Disposition: A | Discharge status: Home / Self Care | Payer: Medicare Other | Source: Ambulatory Visit | Attending: Oncology | Admitting: Oncology

## 2020-12-08 ENCOUNTER — Other Ambulatory Visit: Payer: Self-pay

## 2020-12-08 DIAGNOSIS — R918 Other nonspecific abnormal finding of lung field: Secondary | ICD-10-CM | POA: Diagnosis present

## 2020-12-08 DIAGNOSIS — C3492 Malignant neoplasm of unspecified part of left bronchus or lung: Secondary | ICD-10-CM | POA: Insufficient documentation

## 2020-12-08 DIAGNOSIS — I700 Atherosclerosis of aorta: Secondary | ICD-10-CM | POA: Diagnosis not present

## 2020-12-08 LAB — GLUCOSE, CAPILLARY: Glucose-Capillary: 145 mg/dL — ABNORMAL HIGH (ref 70–99)

## 2020-12-08 MED ORDER — FLUDEOXYGLUCOSE F - 18 (FDG) INJECTION
11.0000 | Freq: Once | INTRAVENOUS | Status: AC
  Administered 2020-12-08: 11.48 via INTRAVENOUS

## 2020-12-08 NOTE — Progress Notes (Signed)
Per Dr. Grayland Ormond, pt will need outpatient PET scan. Able to get scheduled for 9/14 at 0830. Pt made aware and informed that can keep appt as scheduled or move to Thurs 9/15 to see Dr. Grayland Ormond with PET scan results. Pt preferred to move appt to after PET. New appt reviewed with patient. Contact info given and instructed to call with back with any needs or questions. Pt verbalized understanding.

## 2020-12-09 ENCOUNTER — Encounter: Payer: Self-pay | Admitting: *Deleted

## 2020-12-09 ENCOUNTER — Inpatient Hospital Stay: Payer: Medicare Other | Attending: Oncology | Admitting: Oncology

## 2020-12-09 ENCOUNTER — Inpatient Hospital Stay: Payer: Medicare Other

## 2020-12-09 VITALS — BP 124/80 | HR 98 | Temp 96.3°F | Resp 18 | Wt 213.2 lb

## 2020-12-09 DIAGNOSIS — Z79899 Other long term (current) drug therapy: Secondary | ICD-10-CM | POA: Diagnosis not present

## 2020-12-09 DIAGNOSIS — Z803 Family history of malignant neoplasm of breast: Secondary | ICD-10-CM | POA: Insufficient documentation

## 2020-12-09 DIAGNOSIS — I10 Essential (primary) hypertension: Secondary | ICD-10-CM | POA: Insufficient documentation

## 2020-12-09 DIAGNOSIS — Z87891 Personal history of nicotine dependence: Secondary | ICD-10-CM | POA: Diagnosis not present

## 2020-12-09 DIAGNOSIS — R918 Other nonspecific abnormal finding of lung field: Secondary | ICD-10-CM | POA: Diagnosis present

## 2020-12-09 NOTE — Progress Notes (Signed)
New patient for f/u of lung mass. Pt reports difficulty staying asleep but this is ongoing.No other concerns at this time

## 2020-12-10 NOTE — Progress Notes (Signed)
Met with patient during initial consult with Dr. Grayland Ormond to discuss recent PET scan results. All questions answered during visit. Reviewed with Stephanie Ross that will follow up after consult with pulmonary to see if she is a candidate for bronchoscopy. Referral to Dr. Lanney Gins placed. Will follow up with patient after consult or bronchoscopy. Contact info given to patient and instructed to call with any questions or needs. Stephanie Ross verbalized understanding.

## 2020-12-12 NOTE — Progress Notes (Signed)
[facility]  [phone]) [phone] Fax:(336) [phone]  ID: Stephanie Ross OB: Apr 10, 1965  MR#: [id number]  [facility]#:[id number]  Patient Care Team: [facility] as PCP - General Telford Nab, [place] as Oncology Nurse Navigator  CHIEF COMPLAINT: FDG avid left lower lobe lung mass.  INTERVAL HISTORY: Patient is a 55 year old female who was initially evaluated as an inpatient who returns to clinic for further evaluation, discussion of her imaging results, and additional diagnostic planning.  She continues to have chronic shortness of breath requiring oxygen 24 hours a day.  She has chronic weakness and fatigue.  She has no neurologic complaints.  She denies any recent fevers.  She has a fair appetite, but denies weight loss.  She denies any chest pain or hemoptysis.  She has no nausea, vomiting, constipation, or diarrhea.  She has no urinary complaints.  Patient offers no further specific complaints today  REVIEW OF SYSTEMS:   Review of Systems  Constitutional:  Positive for malaise/fatigue. Negative for fever and weight loss.  Respiratory:  Positive for cough and shortness of breath. Negative for hemoptysis.   Cardiovascular: Negative.  Negative for chest pain and leg swelling.  Gastrointestinal: Negative.  Negative for abdominal pain.  Genitourinary: Negative.  Negative for dysuria.  Musculoskeletal: Negative.  Negative for back pain.  Skin: Negative.  Negative for rash.  Neurological:  Positive for weakness. Negative for dizziness, focal weakness and headaches.  Psychiatric/Behavioral: Negative.  The patient is not nervous/anxious.    As per HPI. Otherwise, a complete review of systems is negative.  PAST MEDICAL HISTORY: Past Medical History:  Diagnosis Date   Asthma    Cancer ([place])    COPD (chronic obstructive pulmonary disease) ([place])    Hypertension     PAST SURGICAL HISTORY: Past Surgical History:  Procedure Laterality Date    BILATERAL CARPAL TUNNEL RELEASE     CERVICAL ABLATION     ROTATOR CUFF REPAIR Right     FAMILY HISTORY: Family History  Problem Relation Age of Onset   Breast cancer Sister    Breast cancer Paternal Grandmother     ADVANCED DIRECTIVES (Y/N):  N  HEALTH MAINTENANCE: Social History   Tobacco Use   Smoking status: Former    Packs/day: 0.25    Types: Cigarettes    Quit date: 07/17/2019    Years since quitting: 1.4   Smokeless tobacco: Never  Vaping Use   Vaping Use: Never used  Substance Use Topics   Alcohol use: No   Drug use: Yes    Types: Marijuana    Comment: occasionally     Colonoscopy:  PAP:  Bone density:  Lipid panel:  Allergies  Allergen Reactions   Amoxicillin Other (See Comments)    Has patient had a PCN reaction causing immediate rash, facial/tongue/throat swelling, SOB or lightheadedness with hypotension: Yes Has patient had a PCN reaction causing severe rash involving mucus membranes or skin necrosis: Yes Has patient had a PCN reaction that required hospitalization:yes Has patient had a PCN reaction occurring within the last 10 years: yes If all of the above answers are "NO", then may proceed with Cephalosporin use.    Prednisone Other (See Comments)    Full body rash    Doxycycline Other (See Comments)    Other reaction(s): Other (See Comments) "caused fluid in lungs" "caused fluid in lungs"    Metoprolol     Other reaction(s): Dizziness    Current Outpatient Medications  Medication Sig Dispense Refill  albuterol (PROVENTIL HFA;VENTOLIN HFA) 108 (90 Base) MCG/ACT inhaler Inhale 2 puffs into the lungs every 4 (four) hours as needed for wheezing or shortness of breath. 1 Inhaler 3   amLODipine (NORVASC) 5 MG tablet Take 5 mg by mouth at bedtime.      atorvastatin (LIPITOR) 20 MG tablet Take 20 mg by mouth daily.     baclofen (LIORESAL) 10 MG tablet Take 10 mg by mouth 3 (three) times daily.     budesonide-formoterol (SYMBICORT) 160-4.5 MCG/ACT  inhaler Inhale 2 puffs into the lungs 2 (two) times daily.     celecoxib (CELEBREX) 200 MG capsule Take 200 mg by mouth daily.     cetirizine (ZYRTEC) 10 MG tablet Take 10 mg by mouth daily as needed for allergies.      clopidogrel (PLAVIX) 75 MG tablet Take 75 mg by mouth daily.     Coenzyme Q10 (COQ10) 100 MG CAPS Take 100 mg by mouth daily.      DALIRESP 500 MCG TABS tablet Take 500 mcg by mouth daily.     ergocalciferol (VITAMIN D2) 1.25 MG (50000 UT) capsule Take 50,000 Units by mouth every Monday.     furosemide (LASIX) 20 MG tablet Take 20 mg by mouth daily.     montelukast (SINGULAIR) 10 MG tablet Take 10 mg by mouth at bedtime.     nortriptyline (PAMELOR) 10 MG capsule Take 20 mg by mouth daily.     tiotropium (SPIRIVA) 18 MCG inhalation capsule Place 1 capsule (18 mcg total) into inhaler and inhale daily. 30 capsule 12   calcium carbonate (OS-CAL) 1250 (500 Ca) MG chewable tablet Chew 500 mg by mouth daily. (Patient not taking: Reported on 12/09/2020)     No current facility-administered medications for this visit.    OBJECTIVE: Vitals:   12/09/20 1426  BP: 124/80  Pulse: 98  Resp: 18  Temp: (!) 96.3 F (35.7 C)  SpO2: 98%     Body mass index is 38.37 kg/m.    ECOG FS:2 - Symptomatic, <50% confined to bed   General: Well-developed, well-nourished, no acute distress.  Sitting in a wheelchair. Eyes: Pink conjunctiva, anicteric sclera. HEENT: Normocephalic, moist mucous membranes. Lungs: No audible wheezing or coughing. Heart: Regular rate and rhythm. Abdomen: Soft, nontender, no obvious distention. Musculoskeletal: No edema, cyanosis, or clubbing. Neuro: Alert, answering all questions appropriately. Cranial nerves grossly intact. Skin: No rashes or petechiae noted. Psych: Normal affect.   LAB RESULTS:  Lab Results  Component Value Date   NA 132 (L) 11/21/2020   K 4.4 11/21/2020   CL 91 (L) 11/21/2020   CO2 35 (H) 11/21/2020   GLUCOSE 159 (H) 11/21/2020   BUN  12 11/21/2020   CREATININE 0.62 11/21/2020   CALCIUM 8.8 (L) 11/21/2020   PROT 8.1 11/19/2020   ALBUMIN 3.7 11/19/2020   AST 16 11/19/2020   ALT 17 11/19/2020   ALKPHOS 119 11/19/2020   BILITOT 1.2 11/19/2020   GFRNONAA >60 11/21/2020   GFRAA >60 10/15/2019    Lab Results  Component Value Date   WBC 18.3 (H) 11/20/2020   NEUTROABS 7.2 10/06/2020   HGB 10.9 (L) 11/20/2020   HCT 33.8 (L) 11/20/2020   MCV 85.1 11/20/2020   PLT 242 11/20/2020     STUDIES: CT Angio Chest PE W and/or Wo Contrast  Result Date: 11/19/2020 CLINICAL DATA:  Pain in the right lateral chest, shortness of breath EXAM: CT ANGIOGRAPHY CHEST WITH CONTRAST TECHNIQUE: Multidetector CT imaging of the chest was performed  using the standard protocol during bolus administration of intravenous contrast. Multiplanar CT image reconstructions and MIPs were obtained to evaluate the vascular anatomy. CONTRAST:  82mL OMNIPAQUE IOHEXOL 350 MG/ML SOLN COMPARISON:  10/19/2020 FINDINGS: Cardiovascular: Satisfactory opacification of the pulmonary arteries to the segmental level. No evidence of pulmonary embolism. Normal heart size. No pericardial effusion. Coronary artery and aortic calcifications. Mediastinum/Nodes: Debris in the trachea and both mainstem bronchi, with evidence of distal mucus plugging. No enlarged mediastinal, hilar, or axillary lymph nodes. Thyroid gland and esophagus demonstrate no significant findings. Lungs/Pleura: Redemonstrated irregular, thick-walled mass in the superior left lower lobe, which measures up to 3.2 x 2.7 x 2.6 cm (series 5, image 151 and series 9, image 58), unchanged compared to the prior exam. Centrilobular emphysema. Mild peribronchial thickening. Peribronchovascular consolidation nodularity in the right middle lobe, which is increased compared to 10/19/2020. Similar consolidation is also noted in the right greater than left dependent lower lobes. No pleural effusion. Upper Abdomen: No acute  abnormality. Musculoskeletal: No chest wall abnormality. No acute or significant osseous findings. Review of the MIP images confirms the above findings. IMPRESSION: 1. Negative for pulmonary embolism. 2. Redemonstrated cavitary lesion in the superior left lower lobe, which remains concerning for bronchogenic carcinoma. 3. Increased consolidation in the right middle lobe, with additional similar consolidative opacities in the right greater than left dependent lower lobes. This may be infectious or inflammatory. 4. Aortic Atherosclerosis (ICD10-I70.0) and Emphysema (ICD10-J43.9). Coronary atherosclerosis. Electronically Signed   By: Merilyn Baba M.D.   On: 11/19/2020 13:17   NM PET Image Initial (PI) Skull Base To Thigh  Result Date: 12/08/2020 CLINICAL DATA:  Initial treatment strategy for left lung mass. EXAM: NUCLEAR MEDICINE PET SKULL BASE TO THIGH TECHNIQUE: 11.48 mCi F-18 FDG was injected intravenously. Full-ring PET imaging was performed from the skull base to thigh after the radiotracer. CT data was obtained and used for attenuation correction and anatomic localization. Fasting blood glucose: 145 mg/dl COMPARISON:  Chest CT 11/19/2020 FINDINGS: Mediastinal blood pool activity: SUV max 2.66 Liver activity: SUV max NA NECK: No hypermetabolic lymph nodes in the neck. Incidental CT findings: none CHEST: The cavitary lesion in the superior segment of the left lower lobe is hypermetabolic with SUV max of 79.02. Findings consistent with neoplastic process, likely squamous cell carcinoma. The right upper lobe airspace consolidation is much improved. There is some persistent peripheral and medial nodularity with a small focus of possible cavitation. This is mildly hypermetabolic with SUV max of 4.09. It is still certainly could be an inflammatory or infectious process. Recommend short-term follow-up chest CT to reassess. No enlarged or hypermetabolic mediastinal or hilar lymph nodes and no supraclavicular or  axillary adenopathy. Incidental CT findings: Stable age advanced atherosclerotic calcifications involving the aorta and coronary arteries. ABDOMEN/PELVIS: No hepatic or adrenal gland lesions are identified. No abdominal or pelvic lymphadenopathy. There is a focus of hypermetabolism in the right pelvis which appears to be associated with the right adnexa. I do not see a discrete mass or lesion. The SUV max is 8.40. This is too medial and anterior to be part of the right ureter. Pelvic MRI may be helpful for further evaluation of this finding. Incidental CT findings: Age advanced atherosclerotic calcifications involving the aorta and iliac arteries but no aneurysm. SKELETON: No findings for osseous metastatic disease. Incidental CT findings: none IMPRESSION: 1. 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  2. No mediastinal or hilar metastatic adenopathy. 3. Much improved aeration in the right lung with resolving infiltrates. Persistent nodularity in the anterior aspect medially with mild hypermetabolism. Recommend close CT follow-up. 4. Small focus of hypermetabolism noted right adnexal region without obvious mass. Pelvic MRI without and with contrast may be helpful for further evaluation. No other findings for abdominal metastatic disease. Electronically Signed   By: Marijo Sanes M.D.   On: 12/08/2020 16:33   DG Chest Port 1 View  Result Date: 11/19/2020 CLINICAL DATA:  55 year old female with right lateral chest pain and increased shortness of breath since yesterday. EXAM: PORTABLE CHEST 1 VIEW COMPARISON:  Chest CT 10/19/2020 and earlier. FINDINGS: Portable AP semi upright view at 1013 hours. Emphysema demonstrated by CT last month. Unresolved and mildly progressed reticulonodular and patchy opacity at the right lung base, involving the right middle lobe on the prior CT. Left lower lobe superior segment  cavitary lesion remains occult by portable chest. Streaky left lung base opacity has mildly increased. Stable cardiac size and mediastinal contours. Visualized tracheal air column is within normal limits. No pneumothorax or pleural effusion. IMPRESSION: 1. Unresolved and mildly progressed right middle lobe airspace disease since the CT last month. Bronchopneumonia was favored at that time. 2. Cavitary lesion of the superior segment left lower lobe is occult by portable chest - please see prior CT report. 3. Emphysema. Electronically Signed   By: Genevie Ann M.D.   On: 11/19/2020 10:42    ASSESSMENT: FDG avid left lower lobe lung mass.  PLAN:    FDG avid left lower lobe lung mass: Highly suspicious for underlying malignancy.  PET scan results from December 08, 2020 reviewed independently and report as above with a 3.5 cm hypermetabolic mass with no mediastinal or hilar adenopathy noted.  Given patient's underlying pulmonary and cardiac disease, she is not a surgical candidate but have recommended EBUS/bronchoscopy for possible biopsy and lymph node sampling.  If lymph nodes are negative for disease, she may only require XRT.  If lymph nodes are positive, low-dose weekly chemotherapy may be necessary.  If EBUS/bronchoscopy is not possible, would likely proceed with XRT only.  Return to clinic in 1 to 2 weeks after evaluation of pulmonology for further evaluation and treatment planning.  I spent a total of 30 minutes reviewing chart data, face-to-face evaluation with the patient, counseling and coordination of care as detailed above.   Patient expressed understanding and was in agreement with this plan. She also understands that She can call clinic at any time with any questions, concerns, or complaints.   Cancer Staging No matching staging information was found for the patient.  Lloyd Huger, MD   12/12/2020 9:57 AM

## 2021-01-11 ENCOUNTER — Encounter: Payer: Self-pay | Admitting: *Deleted

## 2021-01-11 NOTE — Progress Notes (Signed)
Called pt to review upcoming follow up appts with Dr. Grayland Ormond and Dr. Baruch Gouty after bronchoscopy. Reviewed appts. All questions answered during call. Pt concerned about receiving anesthesia with her severe copd. Advised pt to discuss at her pre-op phone call on Thursday. Pt verbalized understanding and confirmed appts. Nothing further needed at this time.

## 2021-01-13 ENCOUNTER — Encounter (HOSPITAL_COMMUNITY): Payer: Self-pay | Admitting: Urgent Care

## 2021-01-13 ENCOUNTER — Other Ambulatory Visit: Payer: Self-pay

## 2021-01-13 ENCOUNTER — Other Ambulatory Visit
Admission: RE | Admit: 2021-01-13 | Discharge: 2021-01-13 | Disposition: A | Discharge status: Home / Self Care | Payer: Medicare Other | Source: Ambulatory Visit | Attending: Pulmonary Disease | Admitting: Pulmonary Disease

## 2021-01-13 VITALS — Ht 62.0 in | Wt 215.0 lb

## 2021-01-13 DIAGNOSIS — R918 Other nonspecific abnormal finding of lung field: Secondary | ICD-10-CM

## 2021-01-13 HISTORY — DX: Attention-deficit hyperactivity disorder, unspecified type: F90.9

## 2021-01-13 HISTORY — DX: Bipolar disorder, unspecified: F31.9

## 2021-01-13 HISTORY — DX: Depression, unspecified: F32.A

## 2021-01-13 HISTORY — DX: Anxiety disorder, unspecified: F41.9

## 2021-01-13 HISTORY — DX: Peripheral vascular disease, unspecified: I73.9

## 2021-01-13 HISTORY — DX: Pneumonia, unspecified organism: J18.9

## 2021-01-13 HISTORY — DX: Cerebral infarction, unspecified: I63.9

## 2021-01-13 HISTORY — DX: Dyspnea, unspecified: R06.00

## 2021-01-13 HISTORY — DX: Gastro-esophageal reflux disease without esophagitis: K21.9

## 2021-01-13 HISTORY — DX: Unspecified osteoarthritis, unspecified site: M19.90

## 2021-01-13 HISTORY — DX: Atherosclerotic heart disease of native coronary artery without angina pectoris: I25.10

## 2021-01-13 NOTE — Patient Instructions (Addendum)
Your procedure is scheduled on: Friday 01/21/21 Report to the Registration Desk on the 1st floor of the [facility]. To find out your arrival time, please call [phone] between 1PM - 3PM on: Thursday 01/20/21  REMEMBER: Instructions that are not followed completely may result in serious medical risk, up to and including death; or upon the discretion of your surgeon and anesthesiologist your surgery may need to be rescheduled.  Do not eat food after midnight the night before surgery.  No gum chewing, lozengers or hard candies.  You may however, drink CLEAR liquids up to 2 hours before you are scheduled to arrive for your surgery. Do not drink anything within 2 hours of your scheduled arrival time.  Clear liquids include: - water  - apple juice without pulp - gatorade (not RED, PURPLE, OR BLUE) - black coffee or tea (Do NOT add milk or creamers to the coffee or tea) Do NOT drink anything that is not on this list.  TAKE THESE MEDICATIONS THE MORNING OF SURGERY WITH A SIP OF WATER: atorvastatin (LIPITOR) 20 MG tablet DALIRESP 500 MCG TABS tablet   Use inhalers on the day of surgery and bring to the hospital albuterol (PROVENTIL HFA;VENTOLIN HFA) 108 (90 Base) MCG/ACT inhaler, budesonide-formoterol (SYMBICORT) 160-4.5 MCG/ACT inhaler, tiotropium (SPIRIVA) 18 MCG inhalation capsule  Follow recommendations from Neurologist regarding stopping Aspirin and Plavix  One week prior to surgery: Stop Anti-inflammatories (NSAIDS) such as Advil, Aleve, Ibuprofen, Motrin, Naproxen, Naprosyn and Aspirin based products such as Excedrin, Goodys Powder, BC Powder. Stop ANY OVER THE COUNTER supplements until after surgery Coenzyme Q10 (COQ10) 100 MG CAPS, ergocalciferol (VITAMIN D2) 1.25 MG (50000 UT) capsule. You may however, continue to take Tylenol if needed for pain up until the day of surgery.  No Alcohol for 24 hours before or after surgery.  No Smoking including e-cigarettes for 24 hours  prior to surgery.  No chewable tobacco products for at least 6 hours prior to surgery.  No nicotine patches on the day of surgery.  Do not use any "recreational" drugs for at least a week prior to your surgery.  Please be advised that the combination of cocaine and anesthesia may have negative outcomes, up to and including death. If you test positive for cocaine, your surgery will be cancelled.  On the morning of surgery brush your teeth with toothpaste and water, you may rinse your mouth with mouthwash if you wish. Do not swallow any toothpaste or mouthwash.  Use CHG Soap or wipes as directed on instruction sheet.  Do not wear jewelry, make-up, hairpins, clips or nail polish.  Do not wear lotions, powders, or perfumes.   Do not shave body from the neck down 48 hours prior to surgery just in case you cut yourself which could leave a site for infection.  Also, freshly shaved skin may become irritated if using the CHG soap.  Contact lenses, hearing aids and dentures may not be worn into surgery.  Do not bring valuables to the hospital. [facility] is not responsible for any missing/lost belongings or valuables.   Notify your doctor if there is any change in your medical condition (cold, fever, infection).  Wear comfortable clothing (specific to your surgery type) to the hospital.  After surgery, you can help prevent lung complications by doing breathing exercises.  Take deep breaths and cough every 1-2 hours.   If you are being discharged the day of surgery, you will not be allowed to drive home. You will need  a responsible adult (18 years or older) to drive you home and stay with you that night.   If you are taking public transportation, you will need to have a responsible adult (18 years or older) with you. Please confirm with your physician that it is acceptable to use public transportation.   Please call the [facility] at [phone] if you have any  questions about these instructions.  Surgery Visitation Policy:  Patients undergoing a surgery or procedure may have one family member or support person with them as long as that person is not COVID-19 positive or experiencing its symptoms.  That person may remain in the waiting area during the procedure and may rotate out with other people.  Inpatient Visitation:    Visiting hours are 7 a.m. to 8 p.m. Up to two visitors ages 16+ are allowed at one time in a patient room. The visitors may rotate out with other people during the day. Visitors must check out when they leave, or other visitors will not be allowed. One designated support person may remain overnight. The visitor must pass COVID-19 screenings, use hand sanitizer when entering and exiting the patient's room and wear a mask at all times, including in the patient's room. Patients must also wear a mask when staff or their visitor are in the room. Masking is required regardless of vaccination status.

## 2021-01-17 NOTE — Progress Notes (Signed)
Called patient to inquire whether she had decided to have the bronchoscopy on Friday, October 28. Patient stated that she had COPD and other issues going on and had already called Dr. Teodoro Kil office this past Friday to let them know she was not having this surgery. Dr. Teodoro Kil office called to let them know that patient is still on the OR schedule and that the patient was not having the surgery. Pulmonary office confirmed that they were notified by the patient this past Friday and will let the OR know of the cancellation.

## 2021-01-19 ENCOUNTER — Other Ambulatory Visit (HOSPITAL_COMMUNITY): Payer: Self-pay | Admitting: Internal Medicine

## 2021-01-19 ENCOUNTER — Other Ambulatory Visit: Admission: RE | Admit: 2021-01-19 | Payer: Medicare Other | Source: Ambulatory Visit

## 2021-01-19 ENCOUNTER — Other Ambulatory Visit: Payer: Medicare Other

## 2021-01-19 DIAGNOSIS — Z1231 Encounter for screening mammogram for malignant neoplasm of breast: Secondary | ICD-10-CM

## 2021-01-19 NOTE — Pre-Procedure Instructions (Addendum)
Called over to Dr Teodoro Kil office to see why pt is still on the OR board since pt has called over to Dr Lanney Gins and told PAT that she is not having her procedure on 01-21-21 with Aleskerov. I spoke with Lauren who was filling in for [facility] his nurse. She said that she would tell Chelsea to call over to the OR to take pt off the board

## 2021-01-20 ENCOUNTER — Encounter: Payer: Self-pay | Admitting: Radiation Oncology

## 2021-01-20 ENCOUNTER — Other Ambulatory Visit: Payer: Self-pay

## 2021-01-20 ENCOUNTER — Ambulatory Visit
Admission: RE | Admit: 2021-01-20 | Discharge: 2021-01-20 | Disposition: A | Discharge status: Home / Self Care | Payer: Medicare Other | Source: Ambulatory Visit | Attending: Radiation Oncology | Admitting: Radiation Oncology

## 2021-01-20 ENCOUNTER — Encounter: Payer: Self-pay | Admitting: *Deleted

## 2021-01-20 DIAGNOSIS — R911 Solitary pulmonary nodule: Secondary | ICD-10-CM

## 2021-01-20 NOTE — Progress Notes (Signed)
Met with patient during initial consult with Dr. Baruch Gouty to discuss empiric radiation. All questions answered during visit. Reviewed upcoming appts. Informed that will follow up with Dr. Grayland Ormond once radiation is finished. Pt instructed to call with any questions or needs. Pt verbalized understanding.

## 2021-01-20 NOTE — Consult Note (Signed)
NEW PATIENT EVALUATION  Name: Stephanie Ross  MRN: [id number]  Date:   01/20/2021     DOB: 1966-03-25   This 55 y.o. female patient presents to the clinic for initial evaluation of probable stage Ib (T2 a N0 M0) non-small cell lung cancer left lower lobe and patient with severe COPD emphysema.  REFERRING PHYSICIAN: [facility]  CHIEF COMPLAINT:  Chief Complaint  Patient presents with   Lung Cancer    Initial consultation    DIAGNOSIS: The encounter diagnosis was Lung nodule.   PREVIOUS INVESTIGATIONS:  CT scans PET CT scans reviewed Clinical notes reviewed Labs reviewed  HPI: Patient is a 55 year old female with significant COPD emphysema.  She is been followed by pulmonology for a right lower lobe pneumonia as well as a left lower lobe mass.  CT scan back in July showed an irregular thick-walled cavitary mass in the left lower lobe worrisome for primary bronchogenic carcinoma.  She had borderline enlarged AP window lymph nodes.  PET/CT demonstrated 3.5 cm cavitary mass in the superior segment of the left lower lobe hypermetabolic likely representing a primary lung cancer.  No mediastinal hilar adenopathy was identified to be FDG avid.  She is 88 bronchoscopy candidate high risk based on her comorbidities including significant COPD emphysema.  She is also refused attempt at needle biopsy.  She is now referred to radiation oncology for consideration of treatment.  She is oxygen dependent wheelchair-bound.  PLANNED TREATMENT REGIMEN: SBRT  PAST MEDICAL HISTORY:  has a past medical history of ADHD (attention deficit hyperactivity disorder), Anxiety, Arthritis, Asthma, Bipolar disorder ([place]), Cancer ([place]), CHF (congestive heart failure) ([place]), COPD (chronic obstructive pulmonary disease) ([place]), Coronary artery disease, Depression, Dyspnea, GERD (gastroesophageal reflux disease), Hypertension, Peripheral vascular disease ([place]), Pneumonia, and Stroke ([place]).    PAST  SURGICAL HISTORY:  Past Surgical History:  Procedure Laterality Date   BILATERAL CARPAL TUNNEL RELEASE     CERVICAL ABLATION     ROTATOR CUFF REPAIR Right     FAMILY HISTORY: family history includes Breast cancer in her paternal grandmother and sister.  SOCIAL HISTORY:  reports that she quit smoking about 18 months ago. Her smoking use included cigarettes. She smoked an average of .25 packs per day. She has never used smokeless tobacco. She reports that she does not currently use drugs after having used the following drugs: Marijuana. She reports that she does not drink alcohol.  ALLERGIES: Amoxicillin, Prednisone, Doxycycline, and Metoprolol  MEDICATIONS:  Current Outpatient Medications  Medication Sig Dispense Refill   albuterol (PROVENTIL HFA;VENTOLIN HFA) 108 (90 Base) MCG/ACT inhaler Inhale 2 puffs into the lungs every 4 (four) hours as needed for wheezing or shortness of breath. 1 Inhaler 3   albuterol (PROVENTIL) (2.5 MG/3ML) 0.083% nebulizer solution Take 2.5 mg by nebulization every 6 (six) hours as needed for wheezing or shortness of breath.     amLODipine (NORVASC) 5 MG tablet Take 5 mg by mouth at bedtime.      aspirin 81 MG chewable tablet Chew 81 mg by mouth daily.     atorvastatin (LIPITOR) 20 MG tablet Take 20 mg by mouth daily.     baclofen (LIORESAL) 10 MG tablet Take 10 mg by mouth 3 (three) times daily as needed for muscle spasms.     budesonide-formoterol (SYMBICORT) 160-4.5 MCG/ACT inhaler Inhale 2 puffs into the lungs 2 (two) times daily.     celecoxib (CELEBREX) 200 MG capsule Take 200 mg by mouth daily.  cetirizine (ZYRTEC) 10 MG tablet Take 10 mg by mouth daily.     clopidogrel (PLAVIX) 75 MG tablet Take 75 mg by mouth daily.     Coenzyme Q10 (COQ10) 100 MG CAPS Take 100 mg by mouth daily.      DALIRESP 500 MCG TABS tablet Take 500 mcg by mouth daily.     ergocalciferol (VITAMIN D2) 1.25 MG (50000 UT) capsule Take 50,000 Units by mouth every Monday.      esomeprazole (NEXIUM) 20 MG capsule Take 20 mg by mouth daily at 12 noon.     furosemide (LASIX) 20 MG tablet Take 20 mg by mouth every other day.     montelukast (SINGULAIR) 10 MG tablet Take 10 mg by mouth at bedtime.     nortriptyline (PAMELOR) 10 MG capsule Take 20 mg by mouth daily.     tiotropium (SPIRIVA) 18 MCG inhalation capsule Place 1 capsule (18 mcg total) into inhaler and inhale daily. 30 capsule 12   No current facility-administered medications for this encounter.    ECOG PERFORMANCE STATUS:  1 - Symptomatic but completely ambulatory  REVIEW OF SYSTEMS: Patient denies any weight loss, fatigue, weakness, fever, chills or night sweats. Patient denies any loss of vision, blurred vision. Patient denies any ringing  of the ears or hearing loss. No irregular heartbeat. Patient denies heart murmur or history of fainting. Patient denies any chest pain or pain radiating to her upper extremities. Patient denies any shortness of breath, difficulty breathing at night, cough or hemoptysis. Patient denies any swelling in the lower legs. Patient denies any nausea vomiting, vomiting of blood, or coffee ground material in the vomitus. Patient denies any stomach pain. Patient states has had normal bowel movements no significant constipation or diarrhea. Patient denies any dysuria, hematuria or significant nocturia. Patient denies any problems walking, swelling in the joints or loss of balance. Patient denies any skin changes, loss of hair or loss of weight. Patient denies any excessive worrying or anxiety or significant depression. Patient denies any problems with insomnia. Patient denies excessive thirst, polyuria, polydipsia. Patient denies any swollen glands, patient denies easy bruising or easy bleeding. Patient denies any recent infections, allergies or URI. Patient "s visual fields have not changed significantly in recent time.   PHYSICAL EXAM: BP (P) 128/63 (BP Location: Left Arm, Patient Position:  Sitting)   Pulse (P) 93   Temp (!) (P) 96.2 F (35.7 C) (Tympanic)   Resp (P) 18   Wt (P) 222 lb 6.4 oz (100.9 kg)   BMI (P) 40.68 kg/m  Wheelchair-bound female on nasal oxygen in NAD.  Well-developed well-nourished patient in NAD. HEENT reveals PERLA, EOMI, discs not visualized.  Oral cavity is clear. No oral mucosal lesions are identified. Neck is clear without evidence of cervical or supraclavicular adenopathy. Lungs are clear to A&P. Cardiac examination is essentially unremarkable with regular rate and rhythm without murmur rub or thrill. Abdomen is benign with no organomegaly or masses noted. Motor sensory and DTR levels are equal and symmetric in the upper and lower extremities. Cranial nerves II through XII are grossly intact. Proprioception is intact. No peripheral adenopathy or edema is identified. No motor or sensory levels are noted. Crude visual fields are within normal range.  LABORATORY DATA: Labs are reviewed    RADIOLOGY RESULTS: CT scans and PET CT scans reviewed compatible with above-stated findings   IMPRESSION: Stage Ib (T2 a N0 M0) non-small cell lung cancer of the superior segment of the left lower lobe  in 55 year old female  PLAN: At this time would offer SBRT 60 Gray in 5 fractions to her superior segment left lower lobe.  Risks and benefits and treatment including extremely low side effect profile were discussed with the patient.  She might develop a cough a month out some architectural distortion of her lung fatigue otherwise no significant side effects.  I have personally set up and ordered CT simulation.  We will use 4-dimensional treatment planning as well as motion restriction during treatment planning.  Patient comprehends my recommendations well.  I would like to take this opportunity to thank you for allowing me to participate in the care of your patient.Noreene Filbert, MD

## 2021-01-21 ENCOUNTER — Encounter: Admission: RE | Payer: Self-pay | Source: Ambulatory Visit

## 2021-01-21 ENCOUNTER — Ambulatory Visit: Admission: RE | Admit: 2021-01-21 | Payer: Medicare Other | Source: Ambulatory Visit

## 2021-01-21 SURGERY — VIDEO BRONCHOSCOPY WITH ENDOBRONCHIAL NAVIGATION
Anesthesia: General

## 2021-01-27 ENCOUNTER — Institutional Professional Consult (permissible substitution): Payer: Medicare Other | Admitting: Radiation Oncology

## 2021-01-27 ENCOUNTER — Ambulatory Visit: Payer: Medicare Other | Admitting: Oncology

## 2021-01-31 ENCOUNTER — Other Ambulatory Visit: Payer: Self-pay

## 2021-01-31 ENCOUNTER — Ambulatory Visit (HOSPITAL_COMMUNITY)
Admission: RE | Admit: 2021-01-31 | Discharge: 2021-01-31 | Disposition: A | Discharge status: Home / Self Care | Payer: Medicare Other | Source: Ambulatory Visit | Attending: Internal Medicine | Admitting: Internal Medicine

## 2021-01-31 DIAGNOSIS — Z1231 Encounter for screening mammogram for malignant neoplasm of breast: Secondary | ICD-10-CM | POA: Insufficient documentation

## 2021-02-01 ENCOUNTER — Encounter: Payer: Self-pay | Admitting: *Deleted

## 2021-02-01 ENCOUNTER — Ambulatory Visit
Admission: RE | Admit: 2021-02-01 | Discharge: 2021-02-01 | Disposition: A | Discharge status: Home / Self Care | Payer: Medicare Other | Source: Ambulatory Visit | Attending: Radiation Oncology | Admitting: Radiation Oncology

## 2021-02-01 DIAGNOSIS — Z510 Encounter for antineoplastic radiation therapy: Secondary | ICD-10-CM | POA: Diagnosis not present

## 2021-02-01 DIAGNOSIS — R918 Other nonspecific abnormal finding of lung field: Secondary | ICD-10-CM | POA: Diagnosis present

## 2021-02-01 DIAGNOSIS — C3432 Malignant neoplasm of lower lobe, left bronchus or lung: Secondary | ICD-10-CM | POA: Insufficient documentation

## 2021-02-07 DIAGNOSIS — Z510 Encounter for antineoplastic radiation therapy: Secondary | ICD-10-CM | POA: Diagnosis not present

## 2021-02-09 ENCOUNTER — Ambulatory Visit
Admission: RE | Admit: 2021-02-09 | Discharge: 2021-02-09 | Disposition: A | Discharge status: Home / Self Care | Payer: Medicare Other | Source: Ambulatory Visit | Attending: Radiation Oncology | Admitting: Radiation Oncology

## 2021-02-09 ENCOUNTER — Encounter: Payer: Self-pay | Admitting: *Deleted

## 2021-02-09 DIAGNOSIS — Z510 Encounter for antineoplastic radiation therapy: Secondary | ICD-10-CM | POA: Diagnosis not present

## 2021-02-11 ENCOUNTER — Ambulatory Visit
Admission: RE | Admit: 2021-02-11 | Discharge: 2021-02-11 | Disposition: A | Discharge status: Home / Self Care | Payer: Medicare Other | Source: Ambulatory Visit | Attending: Radiation Oncology | Admitting: Radiation Oncology

## 2021-02-11 DIAGNOSIS — Z510 Encounter for antineoplastic radiation therapy: Secondary | ICD-10-CM | POA: Diagnosis not present

## 2021-02-14 ENCOUNTER — Ambulatory Visit
Admission: RE | Admit: 2021-02-14 | Discharge: 2021-02-14 | Disposition: A | Discharge status: Home / Self Care | Payer: Medicare Other | Source: Ambulatory Visit | Attending: Radiation Oncology | Admitting: Radiation Oncology

## 2021-02-14 DIAGNOSIS — Z510 Encounter for antineoplastic radiation therapy: Secondary | ICD-10-CM | POA: Diagnosis not present

## 2021-02-16 ENCOUNTER — Ambulatory Visit: Payer: Medicare Other

## 2021-02-21 ENCOUNTER — Ambulatory Visit
Admission: RE | Admit: 2021-02-21 | Discharge: 2021-02-21 | Disposition: A | Discharge status: Home / Self Care | Payer: Medicare Other | Source: Ambulatory Visit | Attending: Radiation Oncology | Admitting: Radiation Oncology

## 2021-02-21 DIAGNOSIS — Z510 Encounter for antineoplastic radiation therapy: Secondary | ICD-10-CM | POA: Diagnosis not present

## 2021-02-21 NOTE — Progress Notes (Signed)
Stephanie Ross  Telephone:([phone]) [phone] Fax:(336) [phone]  ID: Stephanie Ross OB: [birth year]  MR#: [id number]  YBO#:[id number]  Patient Care Team: [facility] as PCP - General Telford Nab, [place] as Oncology Nurse Navigator  CHIEF COMPLAINT: FDG avid left lower lobe lung mass.  INTERVAL HISTORY: Patient returns to clinic today at the conclusion of her XRT for further evaluation and future diagnostic planning.  She has increased shortness of breath worse than her baseline and continues to require oxygen 24 hours a day.  She continues to have chronic weakness and fatigue. She has no neurologic complaints.  She denies any recent fevers.  She has a fair appetite, but denies weight loss.  She denies any chest pain or hemoptysis.  She has no nausea, vomiting, constipation, or diarrhea.  She has no urinary complaints.  Patient offers no further specific complaints today.  REVIEW OF SYSTEMS:   Review of Systems  Constitutional:  Positive for malaise/fatigue. Negative for fever and weight loss.  Respiratory:  Positive for cough and shortness of breath. Negative for hemoptysis.   Cardiovascular: Negative.  Negative for chest pain and leg swelling.  Gastrointestinal: Negative.  Negative for abdominal pain.  Genitourinary: Negative.  Negative for dysuria.  Musculoskeletal: Negative.  Negative for back pain.  Skin: Negative.  Negative for rash.  Neurological:  Positive for weakness. Negative for dizziness, focal weakness and headaches.  Psychiatric/Behavioral: Negative.  The patient is not nervous/anxious.    As per HPI. Otherwise, a complete review of systems is negative.  PAST MEDICAL HISTORY: Past Medical History:  Diagnosis Date   ADHD (attention deficit hyperactivity disorder)    Anxiety    Arthritis    Asthma    Bipolar disorder ([place])    Cancer (HCC)    CHF (congestive heart failure) (HCC)    COPD (chronic obstructive pulmonary disease)  (HCC)    Coronary artery disease    Depression    Dyspnea    GERD (gastroesophageal reflux disease)    Hypertension    Peripheral vascular disease (HCC)    Pneumonia    Stroke ([place])     PAST SURGICAL HISTORY: Past Surgical History:  Procedure Laterality Date   BILATERAL CARPAL TUNNEL RELEASE     CERVICAL ABLATION     ROTATOR CUFF REPAIR Right     FAMILY HISTORY: Family History  Problem Relation Age of Onset   Breast cancer Sister    Breast cancer Paternal Grandmother     ADVANCED DIRECTIVES (Y/N):  N  HEALTH MAINTENANCE: Social History   Tobacco Use   Smoking status: Former    Packs/day: 0.25    Types: Cigarettes    Quit date: 07/17/2019    Years since quitting: 1.6   Smokeless tobacco: Never  Vaping Use   Vaping Use: Never used  Substance Use Topics   Alcohol use: No   Drug use: Not Currently    Types: Marijuana    Comment: occasionally     Colonoscopy:  PAP:  Bone density:  Lipid panel:  Allergies  Allergen Reactions   Amoxicillin Other (See Comments)    Has patient had a PCN reaction causing immediate rash, facial/tongue/throat swelling, SOB or lightheadedness with hypotension: Yes Has patient had a PCN reaction causing severe rash involving mucus membranes or skin necrosis: Yes Has patient had a PCN reaction that required hospitalization:yes Has patient had a PCN reaction occurring within the last 10 years: yes If all of the above answers are "NO",  then may proceed with Cephalosporin use.    Prednisone Other (See Comments)    Full body rash    Doxycycline Other (See Comments)    Other reaction(s): Other (See Comments) "caused fluid in lungs" "caused fluid in lungs"    Metoprolol     Other reaction(s): Dizziness   Other Other (See Comments)    Tide detergent - rash    Current Outpatient Medications  Medication Sig Dispense Refill   albuterol (PROVENTIL HFA;VENTOLIN HFA) 108 (90 Base) MCG/ACT inhaler Inhale 2 puffs into the lungs every 4  (four) hours as needed for wheezing or shortness of breath. 1 Inhaler 3   albuterol (PROVENTIL) (2.5 MG/3ML) 0.083% nebulizer solution Take 2.5 mg by nebulization every 6 (six) hours as needed for wheezing or shortness of breath.     amLODipine (NORVASC) 5 MG tablet Take 5 mg by mouth at bedtime.      aspirin 81 MG chewable tablet Chew 81 mg by mouth daily.     atorvastatin (LIPITOR) 20 MG tablet Take 20 mg by mouth daily.     baclofen (LIORESAL) 10 MG tablet Take 10 mg by mouth 3 (three) times daily as needed for muscle spasms.     budesonide-formoterol (SYMBICORT) 160-4.5 MCG/ACT inhaler Inhale 2 puffs into the lungs 2 (two) times daily.     celecoxib (CELEBREX) 200 MG capsule Take 200 mg by mouth daily.     cetirizine (ZYRTEC) 10 MG tablet Take 10 mg by mouth daily.     clopidogrel (PLAVIX) 75 MG tablet Take 75 mg by mouth daily.     Coenzyme Q10 (COQ10) 100 MG CAPS Take 100 mg by mouth daily.      DALIRESP 500 MCG TABS tablet Take 500 mcg by mouth daily.     ergocalciferol (VITAMIN D2) 1.25 MG (50000 UT) capsule Take 50,000 Units by mouth every Monday.     esomeprazole (NEXIUM) 20 MG capsule Take 20 mg by mouth daily at 12 noon.     furosemide (LASIX) 20 MG tablet Take 20 mg by mouth every other day.     montelukast (SINGULAIR) 10 MG tablet Take 10 mg by mouth at bedtime.     nortriptyline (PAMELOR) 10 MG capsule Take 20 mg by mouth daily.     tiotropium (SPIRIVA) 18 MCG inhalation capsule Place 1 capsule (18 mcg total) into inhaler and inhale daily. 30 capsule 12   No current facility-administered medications for this visit.    OBJECTIVE: Vitals:   02/23/21 0943  BP: 114/67  Pulse: (!) 103  Resp: 18  SpO2: 96%     Body mass index is 39.87 kg/m.    ECOG FS:2 - Symptomatic, <50% confined to bed  General: Well-developed, well-nourished, no acute distress.  Sitting in a wheelchair. Eyes: Pink conjunctiva, anicteric sclera. HEENT: Normocephalic, moist mucous membranes. Lungs: No  audible wheezing or coughing. Heart: Regular rate and rhythm. Abdomen: Soft, nontender, no obvious distention. Musculoskeletal: No edema, cyanosis, or clubbing. Neuro: Alert, answering all questions appropriately. Cranial nerves grossly intact. Skin: No rashes or petechiae noted. Psych: Normal affect.  LAB RESULTS:  Lab Results  Component Value Date   NA 132 (L) 11/21/2020   K 4.4 11/21/2020   CL 91 (L) 11/21/2020   CO2 35 (H) 11/21/2020   GLUCOSE 159 (H) 11/21/2020   BUN 12 11/21/2020   CREATININE 0.62 11/21/2020   CALCIUM 8.8 (L) 11/21/2020   PROT 8.1 11/19/2020   ALBUMIN 3.7 11/19/2020   AST 16 11/19/2020  ALT 17 11/19/2020   ALKPHOS 119 11/19/2020   BILITOT 1.2 11/19/2020   GFRNONAA >60 11/21/2020   GFRAA >60 10/15/2019    Lab Results  Component Value Date   WBC 18.3 (H) 11/20/2020   NEUTROABS 7.2 10/06/2020   HGB 10.9 (L) 11/20/2020   HCT 33.8 (L) 11/20/2020   MCV 85.1 11/20/2020   PLT 242 11/20/2020     STUDIES: MM 3D SCREEN BREAST BILATERAL  Result Date: 02/01/2021 CLINICAL DATA:  Screening. EXAM: DIGITAL SCREENING BILATERAL MAMMOGRAM WITH TOMOSYNTHESIS AND CAD TECHNIQUE: Bilateral screening digital craniocaudal and mediolateral oblique mammograms were obtained. Bilateral screening digital breast tomosynthesis was performed. The images were evaluated with computer-aided detection. COMPARISON:  Previous exam(s). ACR Breast Density Category b: There are scattered areas of fibroglandular density. FINDINGS: There are no findings suspicious for malignancy. IMPRESSION: No mammographic evidence of malignancy. A result letter of this screening mammogram will be mailed directly to the patient. RECOMMENDATION: Screening mammogram in one year. (Code:SM-B-01Y) BI-RADS CATEGORY  1: Negative. Electronically Signed   By: Abelardo Diesel M.D.   On: 02/01/2021 11:19    ASSESSMENT: FDG avid left lower lobe lung mass.  PLAN:    FDG avid left lower lobe lung mass: Highly  suspicious for underlying malignancy.  PET scan results from December 08, 2020 reviewed independently and report as above with a 3.5 cm hypermetabolic mass with no mediastinal or hilar adenopathy noted.  Given patient's underlying pulmonary and cardiac disease, she is not a surgical and she declined undergoing EBUS/bronchoscopy for possible biopsy and lymph node sampling.  Patient proceeded directly to XRT which she completed earlier this morning.  No further intervention is needed.  We will repeat PET scan in 3 months to assess for interval change at which point if PET scan is negative, patient can be monitored by CT scans every 6 months.  Return to clinic 1 to 2 days after her PET scan to discuss the results.   I spent a total of 30 minutes reviewing chart data, face-to-face evaluation with the patient, counseling and coordination of care as detailed above.   Patient expressed understanding and was in agreement with this plan. She also understands that She can call clinic at any time with any questions, concerns, or complaints.    Cancer Staging  No matching staging information was found for the patient.  Lloyd Huger, MD   02/23/2021 12:03 PM

## 2021-02-23 ENCOUNTER — Inpatient Hospital Stay: Payer: Medicare Other | Attending: Oncology | Admitting: Oncology

## 2021-02-23 ENCOUNTER — Ambulatory Visit
Admission: RE | Admit: 2021-02-23 | Discharge: 2021-02-23 | Disposition: A | Discharge status: Home / Self Care | Payer: Medicare Other | Source: Ambulatory Visit | Attending: Radiation Oncology | Admitting: Radiation Oncology

## 2021-02-23 ENCOUNTER — Other Ambulatory Visit: Payer: Self-pay

## 2021-02-23 VITALS — BP 114/67 | HR 103 | Resp 18 | Wt 218.0 lb

## 2021-02-23 DIAGNOSIS — R918 Other nonspecific abnormal finding of lung field: Secondary | ICD-10-CM | POA: Diagnosis not present

## 2021-02-23 DIAGNOSIS — Z510 Encounter for antineoplastic radiation therapy: Secondary | ICD-10-CM | POA: Insufficient documentation

## 2021-02-23 NOTE — Progress Notes (Signed)
Patient reports not sleeping well, occasional nausea, the need to increase oxygen from 2 liters to 3 liters.

## 2021-03-09 ENCOUNTER — Encounter: Payer: Self-pay | Admitting: Internal Medicine

## 2021-04-04 ENCOUNTER — Ambulatory Visit
Admission: RE | Admit: 2021-04-04 | Discharge: 2021-04-04 | Disposition: A | Discharge status: Home / Self Care | Payer: Medicare Other | Source: Ambulatory Visit | Attending: Radiation Oncology | Admitting: Radiation Oncology

## 2021-04-04 ENCOUNTER — Other Ambulatory Visit: Payer: Self-pay

## 2021-04-04 ENCOUNTER — Encounter: Payer: Self-pay | Admitting: Radiation Oncology

## 2021-04-04 VITALS — BP 117/64 | Temp 97.5°F | Resp 18 | Wt 226.5 lb

## 2021-04-04 DIAGNOSIS — R911 Solitary pulmonary nodule: Secondary | ICD-10-CM

## 2021-04-04 DIAGNOSIS — J439 Emphysema, unspecified: Secondary | ICD-10-CM | POA: Insufficient documentation

## 2021-04-04 DIAGNOSIS — Z923 Personal history of irradiation: Secondary | ICD-10-CM | POA: Diagnosis not present

## 2021-04-04 NOTE — Progress Notes (Signed)
Radiation Oncology Follow up Note  Name: Stephanie Ross   Date:   04/04/2021 MRN:  [id number] DOB: [name] 05, 1965    This 56 y.o. female presents to the clinic today for 1 month follow-up status post SBRT for probable stage Ib (T2 a N0 M0) non-small cell lung cancer left lower lobe in a patient with severe COPD emphysema.  REFERRING PROVIDER: [facility]  HPI: Patient is a 56 year old female now at 1 month having completed SBRT to her left lower lobe for presumed stage Ib non-small cell lung cancer.  Seen today in routine follow-up she is doing well she is developed a mild cough which may be related to's to her treatments.  She specifically Nuys hemoptysis or chest tightness.  She is recently increased her nasal O2 although is breathing fine she is having no dysphagia..  COMPLICATIONS OF TREATMENT: none  FOLLOW UP COMPLIANCE: keeps appointments   PHYSICAL EXAM:  BP 117/64    Temp (!) 97.5 F (36.4 C) (Tympanic)    Resp 18    Wt 226 lb 8 oz (102.7 kg)    BMI 41.43 kg/m  Wheelchair-bound female in NAD on nasal oxygen.  Well-developed well-nourished patient in NAD. HEENT reveals PERLA, EOMI, discs not visualized.  Oral cavity is clear. No oral mucosal lesions are identified. Neck is clear without evidence of cervical or supraclavicular adenopathy. Lungs are clear to A&P. Cardiac examination is essentially unremarkable with regular rate and rhythm without murmur rub or thrill. Abdomen is benign with no organomegaly or masses noted. Motor sensory and DTR levels are equal and symmetric in the upper and lower extremities. Cranial nerves II through XII are grossly intact. Proprioception is intact. No peripheral adenopathy or edema is identified. No motor or sensory levels are noted. Crude visual fields are within normal range.  RADIOLOGY RESULTS: No current films to review  PLAN: Present time patient is doing well with very low side effect profile from her SBRT.  And pleased with her  overall progress.  I have asked to see her back in middle of March when she will have completed a PET CT scan for my review.  Patient knows to call with any concerns.  I would like to take this opportunity to thank you for allowing me to participate in the care of your patient.Noreene Filbert, MD

## 2021-04-26 ENCOUNTER — Other Ambulatory Visit: Payer: Medicare Other

## 2021-05-16 ENCOUNTER — Other Ambulatory Visit: Payer: Medicare Other

## 2021-05-25 ENCOUNTER — Ambulatory Visit: Payer: Medicare Other | Admitting: Oncology

## 2021-05-26 ENCOUNTER — Encounter (HOSPITAL_COMMUNITY)
Admission: RE | Admit: 2021-05-26 | Discharge: 2021-05-26 | Disposition: A | Discharge status: Home / Self Care | Payer: Medicare Other | Source: Ambulatory Visit | Attending: Oncology | Admitting: Oncology

## 2021-05-26 ENCOUNTER — Other Ambulatory Visit: Payer: Self-pay

## 2021-05-26 DIAGNOSIS — R918 Other nonspecific abnormal finding of lung field: Secondary | ICD-10-CM | POA: Insufficient documentation

## 2021-05-26 MED ORDER — FLUDEOXYGLUCOSE F - 18 (FDG) INJECTION
11.2700 | Freq: Once | INTRAVENOUS | Status: AC | PRN
  Administered 2021-05-26: 11.27 via INTRAVENOUS

## 2021-05-26 NOTE — Progress Notes (Signed)
[facility]  [phone] Fax:(336) [phone]  ID: Stephanie Ross OB: 1965-04-03  MR#: [id number]  UMP#:[id number]  Patient Care Team: [facility] as PCP - General Telford Nab, [place] as Oncology Nurse Navigator  CHIEF COMPLAINT: FDG avid left lower lobe lung mass.  INTERVAL HISTORY: Patient returns to clinic today for further evaluation and discussion of her PET scan results.  She continues to have shortness of breath and requires oxygen 24 hours today, but otherwise feels well and is asymptomatic.  She does not complain of any weakness or fatigue today.  She has no neurologic complaints.  She denies any recent fevers.  She has a fair appetite, but denies weight loss.  She denies any chest pain or hemoptysis.  She has no nausea, vomiting, constipation, or diarrhea.  She has no urinary complaints.  Patient offers no further specific complaints today.  REVIEW OF SYSTEMS:   Review of Systems  Constitutional: Negative.  Negative for fever, malaise/fatigue and weight loss.  Respiratory:  Positive for shortness of breath. Negative for cough and hemoptysis.   Cardiovascular: Negative.  Negative for chest pain and leg swelling.  Gastrointestinal: Negative.  Negative for abdominal pain.  Genitourinary: Negative.  Negative for dysuria.  Musculoskeletal: Negative.  Negative for back pain.  Skin: Negative.  Negative for rash.  Neurological: Negative.  Negative for dizziness, focal weakness, weakness and headaches.  Psychiatric/Behavioral: Negative.  The patient is not nervous/anxious.    As per HPI. Otherwise, a complete review of systems is negative.  PAST MEDICAL HISTORY: Past Medical History:  Diagnosis Date   ADHD (attention deficit hyperactivity disorder)    Anxiety    Arthritis    Asthma    Bipolar disorder ([place])    Cancer (HCC)    CHF (congestive heart failure) (HCC)    COPD (chronic obstructive pulmonary disease) (HCC)     Coronary artery disease    Depression    Dyspnea    GERD (gastroesophageal reflux disease)    Hypertension    Peripheral vascular disease (HCC)    Pneumonia    Stroke ([place])     PAST SURGICAL HISTORY: Past Surgical History:  Procedure Laterality Date   BILATERAL CARPAL TUNNEL RELEASE     CERVICAL ABLATION     ROTATOR CUFF REPAIR Right     FAMILY HISTORY: Family History  Problem Relation Age of Onset   Breast cancer Sister    Breast cancer Paternal Grandmother     ADVANCED DIRECTIVES (Y/N):  N  HEALTH MAINTENANCE: Social History   Tobacco Use   Smoking status: Former    Packs/day: 0.25    Types: Cigarettes    Quit date: 07/17/2019    Years since quitting: 1.8   Smokeless tobacco: Never  Vaping Use   Vaping Use: Never used  Substance Use Topics   Alcohol use: No   Drug use: Not Currently    Types: Marijuana    Comment: occasionally     Colonoscopy:  PAP:  Bone density:  Lipid panel:  Allergies  Allergen Reactions   Amoxicillin Other (See Comments)    Has patient had a PCN reaction causing immediate rash, facial/tongue/throat swelling, SOB or lightheadedness with hypotension: Yes Has patient had a PCN reaction causing severe rash involving mucus membranes or skin necrosis: Yes Has patient had a PCN reaction that required hospitalization:yes Has patient had a PCN reaction occurring within the last 10 years: yes If all of the above answers are "NO", then  may proceed with Cephalosporin use.    Prednisone Other (See Comments)    Full body rash    Doxycycline Other (See Comments)    Other reaction(s): Other (See Comments) "caused fluid in lungs" "caused fluid in lungs"    Metoprolol     Other reaction(s): Dizziness   Other Other (See Comments)    Tide detergent - rash    Current Outpatient Medications  Medication Sig Dispense Refill   albuterol (PROVENTIL HFA;VENTOLIN HFA) 108 (90 Base) MCG/ACT inhaler Inhale 2 puffs into the lungs every 4 (four) hours  as needed for wheezing or shortness of breath. 1 Inhaler 3   albuterol (PROVENTIL) (2.5 MG/3ML) 0.083% nebulizer solution Take 2.5 mg by nebulization every 6 (six) hours as needed for wheezing or shortness of breath.     amLODipine (NORVASC) 5 MG tablet Take 5 mg by mouth at bedtime.      aspirin 81 MG chewable tablet Chew 81 mg by mouth daily.     atorvastatin (LIPITOR) 40 MG tablet Take 40 mg by mouth daily.     baclofen (LIORESAL) 10 MG tablet Take 10 mg by mouth 3 (three) times daily as needed for muscle spasms.     budesonide-formoterol (SYMBICORT) 160-4.5 MCG/ACT inhaler Inhale 2 puffs into the lungs 2 (two) times daily.     cetirizine (ZYRTEC) 10 MG tablet Take 10 mg by mouth daily.     clopidogrel (PLAVIX) 75 MG tablet Take 75 mg by mouth daily.     Coenzyme Q10 (COQ10) 100 MG CAPS Take 100 mg by mouth daily.      DALIRESP 500 MCG TABS tablet Take 500 mcg by mouth daily.     ergocalciferol (VITAMIN D2) 1.25 MG (50000 UT) capsule Take 50,000 Units by mouth every Monday.     esomeprazole (NEXIUM) 20 MG capsule Take 20 mg by mouth daily at 12 noon.     furosemide (LASIX) 20 MG tablet Take 20 mg by mouth every other day.     losartan (COZAAR) 25 MG tablet Take 25 mg by mouth daily.     meloxicam (MOBIC) 7.5 MG tablet 1 tablet     montelukast (SINGULAIR) 10 MG tablet Take 10 mg by mouth at bedtime.     nortriptyline (PAMELOR) 10 MG capsule Take 20 mg by mouth daily.     tiotropium (SPIRIVA) 18 MCG inhalation capsule Place 1 capsule (18 mcg total) into inhaler and inhale daily. 30 capsule 12   No current facility-administered medications for this visit.    OBJECTIVE: Vitals:   05/31/21 0947  BP: 113/76  Pulse: (!) 112  Resp: 18  SpO2: 98%     Body mass index is 42.58 kg/m.    ECOG FS:2 - Symptomatic, <50% confined to bed  General: Well-developed, well-nourished, no acute distress.  Sitting in a wheelchair. Eyes: Pink conjunctiva, anicteric sclera. HEENT: Normocephalic, moist  mucous membranes. Lungs: No audible wheezing or coughing. Heart: Regular rate and rhythm. Abdomen: Soft, nontender, no obvious distention. Musculoskeletal: No edema, cyanosis, or clubbing. Neuro: Alert, answering all questions appropriately. Cranial nerves grossly intact. Skin: No rashes or petechiae noted. Psych: Normal affect.  LAB RESULTS:  Lab Results  Component Value Date   NA 132 (L) 11/21/2020   K 4.4 11/21/2020   CL 91 (L) 11/21/2020   CO2 35 (H) 11/21/2020   GLUCOSE 159 (H) 11/21/2020   BUN 12 11/21/2020   CREATININE 0.62 11/21/2020   CALCIUM 8.8 (L) 11/21/2020   PROT 8.1 11/19/2020  ALBUMIN 3.7 11/19/2020   AST 16 11/19/2020   ALT 17 11/19/2020   ALKPHOS 119 11/19/2020   BILITOT 1.2 11/19/2020   GFRNONAA >60 11/21/2020   GFRAA >60 10/15/2019    Lab Results  Component Value Date   WBC 18.3 (H) 11/20/2020   NEUTROABS 7.2 10/06/2020   HGB 10.9 (L) 11/20/2020   HCT 33.8 (L) 11/20/2020   MCV 85.1 11/20/2020   PLT 242 11/20/2020     STUDIES: NM PET Image Restag (PS) Skull Base To Thigh  Result Date: 05/27/2021 CLINICAL DATA:  Subsequent treatment strategy for left lower lobe lung cancer. Post radiation therapy. EXAM: NUCLEAR MEDICINE PET SKULL BASE TO THIGH TECHNIQUE: 11.27 mCi F-18 FDG was injected intravenously. Full-ring PET imaging was performed from the skull base to thigh after the radiotracer. CT data was obtained and used for attenuation correction and anatomic localization. Fasting blood glucose: 173 mg/dl COMPARISON:  PET-CT 12/08/2020.  Chest CT 11/19/2020 FINDINGS: Mediastinal blood pool activity: SUV max 3.4 NECK: No hypermetabolic cervical lymph nodes are identified.There are no lesions of the pharyngeal mucosal space. Physiologic activity associated with the tongue. Incidental CT findings: Bilateral carotid atherosclerosis. CHEST: There are no hypermetabolic mediastinal, hilar or axillary lymph nodes. The treated cavitary lesion medially in the left  lower lobe is significantly smaller and abuts the descending thoracic aorta, measuring approximately 3.0 x 1.1 cm on image 43/7 (previously up to 3.4 by 2.5 cm). This demonstrates no significantly increased metabolic activity relative to the adjacent blood pool in the thoracic aorta (SUV max 4.0). No new hypermetabolic or enlarging pulmonary nodules identified. Incidental CT findings: Moderate centrilobular and paracentral emphysema with scattered pulmonary scarring. Atherosclerosis of the aorta, great vessels and coronary arteries. There are calcifications of the aortic valve. ABDOMEN/PELVIS: There is no hypermetabolic activity within the liver, adrenal glands, spleen or pancreas. There is no hypermetabolic nodal activity. Focal hypermetabolic activity is again noted within the right pelvis (SUV max 13.4, previously 8.4). This appears to be associated with the lumen of the sigmoid colon and is suspicious for an underlying villous adenoma or early colon cancer. Incidental CT findings: Moderate stool throughout the colon. No evidence of bowel obstruction or perforation. Diffuse aortic and branch vessel atherosclerosis. SKELETON: There is no hypermetabolic activity to suggest osseous metastatic disease. Incidental CT findings: Physiologic activity within the hands. IMPRESSION: 1. Near complete resolution of previously demonstrated hypermetabolic activity associated with the cavitary left lower lobe lung mass. This mass is significantly smaller with metabolic activity similar to blood pool in the adjacent aorta. 2. No evidence of metastatic disease.  No new pulmonary findings. 3. Increasingly hypermetabolic focus in the pelvis which appears to be associated with the sigmoid colon, suspicious for a villous adenoma or early colon cancer. Recommend colonoscopy if not recently performed. 4. Coronary and aortic atherosclerosis (ICD10-I70.0). Emphysema (ICD10-J43.9). Electronically Signed   By: Richardean Sale M.D.   On:  05/27/2021 16:30    ASSESSMENT: FDG avid left lower lobe lung mass.  PLAN:    FDG avid left lower lobe lung mass: Highly suspicious for underlying malignancy.  PET scan results from December 08, 2020 reviewed independently with a 3.5 cm hypermetabolic mass with no mediastinal or hilar adenopathy noted.  Given patient's underlying pulmonary and cardiac disease, she was not a surgical candidate and was unable to undergo EBUS/bronchoscopy for possible biopsy and lymph node sampling secondary to high risk with anesthesia.  Patient proceeded directly to XRT which she completed in approximately December 2022.  Repeat  PET scan on May 27, 2021 reviewed independently and reported as above with near complete resolution of disease.  No further intervention is needed at this time.  Return to clinic in 6 months with repeat imaging using CT scan and further evaluation.   PET positive sigmoid colon lesion: Patient has appointment with GI next week and was encouraged to keep this appointment for discussion of possible colonoscopy despite her difficulties with anesthesia.      Patient expressed understanding and was in agreement with this plan. She also understands that She can call clinic at any time with any questions, concerns, or complaints.    Cancer Staging  No matching staging information was found for the patient.  Lloyd Huger, MD   05/31/2021 10:29 AM

## 2021-05-31 ENCOUNTER — Ambulatory Visit
Admission: RE | Admit: 2021-05-31 | Discharge: 2021-05-31 | Disposition: A | Discharge status: Home / Self Care | Payer: Medicare Other | Source: Ambulatory Visit | Attending: Radiation Oncology | Admitting: Radiation Oncology

## 2021-05-31 ENCOUNTER — Inpatient Hospital Stay: Payer: Medicare Other | Attending: Oncology | Admitting: Oncology

## 2021-05-31 ENCOUNTER — Other Ambulatory Visit: Payer: Self-pay

## 2021-05-31 VITALS — BP 113/76 | HR 112 | Resp 18 | Wt 232.8 lb

## 2021-05-31 DIAGNOSIS — J432 Centrilobular emphysema: Secondary | ICD-10-CM | POA: Diagnosis not present

## 2021-05-31 DIAGNOSIS — Z87891 Personal history of nicotine dependence: Secondary | ICD-10-CM | POA: Diagnosis not present

## 2021-05-31 DIAGNOSIS — J439 Emphysema, unspecified: Secondary | ICD-10-CM | POA: Insufficient documentation

## 2021-05-31 DIAGNOSIS — Z7982 Long term (current) use of aspirin: Secondary | ICD-10-CM | POA: Diagnosis not present

## 2021-05-31 DIAGNOSIS — R911 Solitary pulmonary nodule: Secondary | ICD-10-CM | POA: Insufficient documentation

## 2021-05-31 DIAGNOSIS — Z79899 Other long term (current) drug therapy: Secondary | ICD-10-CM | POA: Insufficient documentation

## 2021-05-31 DIAGNOSIS — Z7951 Long term (current) use of inhaled steroids: Secondary | ICD-10-CM | POA: Insufficient documentation

## 2021-05-31 DIAGNOSIS — R918 Other nonspecific abnormal finding of lung field: Secondary | ICD-10-CM

## 2021-05-31 DIAGNOSIS — C3432 Malignant neoplasm of lower lobe, left bronchus or lung: Secondary | ICD-10-CM | POA: Insufficient documentation

## 2021-05-31 DIAGNOSIS — Z923 Personal history of irradiation: Secondary | ICD-10-CM | POA: Insufficient documentation

## 2021-05-31 NOTE — Progress Notes (Signed)
Radiation Oncology ?Follow up Note ? ?Name: Stephanie Ross   ?Date:   05/31/2021 ?MRN:  [id number] ?DOB: September 16, 1965  ? ? ?This 56 y.o. female presents to the clinic today for 33-month follow-up status post SBRT to her left lower lobe for presumed stage Ib (T2 N0 M0) non-small cell lung cancer and patient with severe COPD emphysema. ? ?REFERRING PROVIDER: [facility] ? ?HPI: Patient is a 56 year old female now at 3 months having completed SBRT to her left lower lobe for presumed stage Ib non-small cell lung cancer.  She is seen today in routine follow-up she is doing well.  Specifically Nuys cough hemoptysis chest tightness or any change in her pulmonary function.  She continues to have continuous nasal oxygen.  She had a recent CT scan which I have reviewed shows almost complete resolution of hypermetabolic activity in this region.  She did have a hypermetabolic lesion in her rectum for which she is being referred to GI for possible colonoscopy which I have supported.. ? ?COMPLICATIONS OF TREATMENT: none ? ?FOLLOW UP COMPLIANCE: keeps appointments  ? ?PHYSICAL EXAM:  ?There were no vitals taken for this visit. ?Wheelchair-bound female on nasal oxygen in NAD.  Well-developed well-nourished patient in NAD. HEENT reveals PERLA, EOMI, discs not visualized.  Oral cavity is clear. No oral mucosal lesions are identified. Neck is clear without evidence of cervical or supraclavicular adenopathy. Lungs are clear to A&P. Cardiac examination is essentially unremarkable with regular rate and rhythm without murmur rub or thrill. Abdomen is benign with no organomegaly or masses noted. Motor sensory and DTR levels are equal and symmetric in the upper and lower extremities. Cranial nerves II through XII are grossly intact. Proprioception is intact. No peripheral adenopathy or edema is identified. No motor or sensory levels are noted. Crude visual fields are within normal range. ? ?RADIOLOGY RESULTS: CT scans reviewed  compatible with above-stated findings ? ?PLAN: The present time patient is doing well very low side effect profile from SBRT.  Excellent results by PET/CT criteria.  I have asked to see her back in 6 months for follow-up with a CT scan at that time.  Patient knows to call with any concerns. ? ?I would like to take this opportunity to thank you for allowing me to participate in the care of your patient.. ?  ? Noreene Filbert, MD ? ?

## 2021-05-31 NOTE — Progress Notes (Signed)
Pt c/o difficulty falling asleep. Also reports nausea relieved by zofran.  ?

## 2021-06-09 ENCOUNTER — Telehealth: Payer: Self-pay | Admitting: Gastroenterology

## 2021-06-09 ENCOUNTER — Ambulatory Visit (INDEPENDENT_AMBULATORY_CARE_PROVIDER_SITE_OTHER): Payer: Medicare Other | Admitting: Gastroenterology

## 2021-06-09 ENCOUNTER — Other Ambulatory Visit: Payer: Self-pay

## 2021-06-09 ENCOUNTER — Encounter: Payer: Self-pay | Admitting: Gastroenterology

## 2021-06-09 ENCOUNTER — Ambulatory Visit: Payer: Medicare Other | Admitting: Gastroenterology

## 2021-06-09 DIAGNOSIS — R195 Other fecal abnormalities: Secondary | ICD-10-CM | POA: Diagnosis not present

## 2021-06-09 NOTE — Progress Notes (Signed)
? ? ?Gastroenterology Office Note   ? ?Referring Provider: [facility] ?Primary Care Physician:  [facility]  ?Primary GI: Dr. Gala Romney ? ? ?Chief Complaint  ? ?Chief Complaint  ?Patient presents with  ? Colonoscopy  ?  Positive fit test  ? ? ? ?History of Present Illness  ? ?Stephanie Ross is a 56 y.o. female presenting today at the request of [facility] due to heme positive stool. She has a history of CAD, HLD, COPD, right-sided ischemic stroke in 2021, CHF, left lower lobe lung mass concerning for bronchogenic carcinoma but not a surgical candidate and had 5 rounds of radiation therapy.  ? ? ?No abdominal pain, N/V, changes in bowel habits, constipation, diarrhea, overt GI bleeding, GERD, dysphagia, unexplained weight loss, lack of appetite, unexplained weight gain.  ? ?3 liters O2 nasal cannula continuously. Recent PET scan 05/26/21 with near complete resolution of previously demonstrated hypermetabolic activity associated with the cavitary left lower lobe lung mass. This mass is significantly smaller with metabolic activity similar to blood pool in the adjacent aorta. Increasingly hypermetabolic focus in pelvis appears to be associated with sigmoid, suspicious for villous adenoma or early colon cancer.  ? ?Past Medical History:  ?Diagnosis Date  ? ADHD (attention deficit hyperactivity disorder)   ? Anxiety   ? Arthritis   ? Asthma   ? Bipolar disorder ([place])   ? Cancer [facility])   ? CHF (congestive heart failure) ([place])   ? COPD (chronic obstructive pulmonary disease) ([place])   ? Coronary artery disease   ? Depression   ? Dyspnea   ? GERD (gastroesophageal reflux disease)   ? Hypertension   ? Peripheral vascular disease ([place])   ? Pneumonia   ? Stroke [facility])   ? ? ?Past Surgical History:  ?Procedure Laterality Date  ? BILATERAL CARPAL TUNNEL RELEASE    ? CERVICAL ABLATION    ? ROTATOR CUFF REPAIR Right   ? ? ?Current Outpatient Medications  ?Medication  Sig Dispense Refill  ? albuterol (PROVENTIL HFA;VENTOLIN HFA) 108 (90 Base) MCG/ACT inhaler Inhale 2 puffs into the lungs every 4 (four) hours as needed for wheezing or shortness of breath. 1 Inhaler 3  ? albuterol (PROVENTIL) (2.5 MG/3ML) 0.083% nebulizer solution Take 2.5 mg by nebulization every 6 (six) hours as needed for wheezing or shortness of breath.    ? aspirin 81 MG chewable tablet Chew 81 mg by mouth daily.    ? atorvastatin (LIPITOR) 40 MG tablet Take 40 mg by mouth daily.    ? baclofen (LIORESAL) 10 MG tablet Take 10 mg by mouth 3 (three) times daily as needed for muscle spasms.    ? budesonide-formoterol (SYMBICORT) 160-4.5 MCG/ACT inhaler Inhale 2 puffs into the lungs 2 (two) times daily.    ? cetirizine (ZYRTEC) 10 MG tablet Take 10 mg by mouth daily.    ? clopidogrel (PLAVIX) 75 MG tablet Take 75 mg by mouth daily.    ? Coenzyme Q10 (COQ10) 100 MG CAPS Take 100 mg by mouth daily.     ? DALIRESP 500 MCG TABS tablet Take 500 mcg by mouth daily.    ? ergocalciferol (VITAMIN D2) 1.25 MG (50000 UT) capsule Take 50,000 Units by mouth every Monday.    ? esomeprazole (NEXIUM) 20 MG capsule Take 20 mg by mouth daily at 12 noon.    ? furosemide (LASIX) 20 MG tablet Take 20 mg by mouth every other day.    ?  losartan (COZAAR) 25 MG tablet Take 25 mg by mouth daily.    ? meloxicam (MOBIC) 7.5 MG tablet 1 tablet    ? montelukast (SINGULAIR) 10 MG tablet Take 10 mg by mouth at bedtime.    ? nortriptyline (PAMELOR) 10 MG capsule Take 20 mg by mouth daily.    ? tiotropium (SPIRIVA) 18 MCG inhalation capsule Place 1 capsule (18 mcg total) into inhaler and inhale daily. 30 capsule 12  ? amLODipine (NORVASC) 5 MG tablet Take 5 mg by mouth at bedtime.  (Patient not taking: Reported on 06/09/2021)    ? ?No current facility-administered medications for this visit.  ? ? ?Allergies as of 06/09/2021 - Review Complete 06/09/2021  ?Allergen Reaction Noted  ? Amoxicillin Other (See Comments) 04/30/2017  ? Prednisone Other (See  Comments) 04/30/2017  ? Doxycycline Other (See Comments) 07/02/2019  ? Metoprolol  10/11/2017  ? Other Other (See Comments) 01/19/2021  ? ? ?Family History  ?Problem Relation Age of Onset  ? Breast cancer Sister   ? Breast cancer Paternal Grandmother   ? ? ?Social History  ? ?Socioeconomic History  ? Marital status: Divorced  ?  Spouse name: Not on file  ? Number of children: Not on file  ? Years of education: Not on file  ? Highest education level: Not on file  ?Occupational History  ? Not on file  ?Tobacco Use  ? Smoking status: Former  ?  Packs/day: 0.25  ?  Types: Cigarettes  ?  Quit date: 07/17/2019  ?  Years since quitting: 1.8  ? Smokeless tobacco: Never  ?Vaping Use  ? Vaping Use: Never used  ?Substance and Sexual Activity  ? Alcohol use: No  ? Drug use: Not Currently  ?  Types: Marijuana  ?  Comment: occasionally  ? Sexual activity: Not on file  ?Other Topics Concern  ? Not on file  ?Social History Narrative  ? Not on file  ? ?Social Determinants of Health  ? ?Financial Resource Strain: Not on file  ?Food Insecurity: Not on file  ?Transportation Needs: Not on file  ?Physical Activity: Not on file  ?Stress: Not on file  ?Social Connections: Not on file  ?Intimate Partner Violence: Not on file  ? ? ? ?Review of Systems  ? ?Gen: Denies any fever, chills, fatigue, weight loss, lack of appetite.  ?CV: Denies chest pain, heart palpitations, peripheral edema, syncope.  ?Resp: +DOE ?GI: see HPI ?GU : Denies urinary burning, urinary frequency, urinary hesitancy ?MS: Denies joint pain, muscle weakness, cramps, or limitation of movement.  ?Derm: Denies rash, itching, dry skin ?Psych: Denies depression, anxiety, memory loss, and confusion ?Heme: Denies bruising, bleeding, and enlarged lymph nodes. ? ? ?Physical Exam  ? ?BP 128/64 (BP Location: Right Arm, Patient Position: Sitting, Cuff Size: Large)   Pulse (!) 112   Temp (!) 96.9 ?F (36.1 ?C) (Temporal)   Ht 5' 1.5" (1.562 m)   Wt 231 lb 3.2 oz (104.9 kg)   SpO2  96%   BMI 42.98 kg/m?  ?General:   Alert and oriented. Pleasant and cooperative. Well-nourished and well-developed.  ?Head:  Normocephalic and atraumatic. ?Eyes:  Without icterus ?Ears:  Normal auditory acuity. ?Lungs:  Clear to auscultation bilaterally.  ?Heart:  S1, S2 present without murmurs appreciated.  ?Abdomen:  +BS, soft, non-tender and non-distended. No HSM noted. No guarding or rebound. No masses appreciated.  ?Rectal:  Deferred  ?Msk:  Symmetrical without gross deformities. Normal posture. ?Extremities:  Without edema. ?Neurologic:  Alert  and  oriented x4;  grossly normal neurologically. ?Skin:  Intact without significant lesions or rashes. ?Psych:  Alert and cooperative. Normal mood and affect. ? ? ?Assessment  ? ?Stephanie Ross is a 56 y.o. female presenting today  at the request of [facility] due to heme positive stool. She has a history of CAD, HLD, COPD, right-sided ischemic stroke in 2021, CHF, left lower lobe lung mass concerning for bronchogenic carcinoma but not a surgical candidate and had 5 rounds of radiation therapy.  ? ?PET scan recently with increased hypermetabolic focus in pelvis appearing to associate with the sigmoid that was suspicious for polyp/mass.  ? ?In light of her cardiopulmonary history, we will need to obtain cardiac and pulmonology clearance prior.  ? ?PLAN  ? ?Obtain cardiac/pulmonary clearance ? ?Recommend at minimum flex sig, with goal of colonoscopy in near future by Dr. Gala Romney once clearance received ? ?Further recommendations to follow. ? ? ? ?Annitta Needs, PhD, ANP-BC ?[facility]  ? ? ?

## 2021-06-09 NOTE — Progress Notes (Signed)
Losartan potassium ?

## 2021-06-09 NOTE — Patient Instructions (Signed)
We are reaching out to your cardiologist and pulmonologist regarding risk for colonoscopy. ? ?We will be in touch thereafter! ? ?It was a pleasure to see you today. I want to create trusting relationships with patients to provide genuine, compassionate, and quality care. I value your feedback. If you receive a survey regarding your visit,  I greatly appreciate you taking time to fill this out.  ? ?Annitta Needs, PhD, ANP-BC ?[facility]  ? ?

## 2021-06-09 NOTE — Telephone Encounter (Signed)
PATIENT NEEDS THE MEDICATION LOSARTAN POTASSIUM ADDED TO HER MEDICATIONS ?

## 2021-06-09 NOTE — Telephone Encounter (Signed)
Phoned and advised the pt that the medication was already listed in her chart from December visit. Pt didn't remember ?

## 2021-06-13 ENCOUNTER — Telehealth: Payer: Self-pay

## 2021-06-13 NOTE — Telephone Encounter (Signed)
Sent letter to Dr. Erby Pian and Dr Creta Levin for the pt regarding cardiac clearance for a colonoscopy. Waiting on responses. ?

## 2021-06-21 NOTE — Telephone Encounter (Signed)
Documentation to be placed on your desk regarding the pt's clearance for colonoacopy ?

## 2021-07-11 NOTE — Telephone Encounter (Signed)
Pt has a Pulmonary visit scheduled for 08/09/2021 and on your desk [facility]  Dr Gennette Pac is placed on your desk for your signature so I can send to scan. ?

## 2021-08-09 ENCOUNTER — Telehealth: Payer: Self-pay

## 2021-08-09 NOTE — Telephone Encounter (Signed)
Phoned to [facility] to find out if the pt has an appt for today. The pt has a appt for tomorrow @ 11:45 am with Dr. Raul Del. Advised the receptionist that we are needing a cardiac clearance on this pt. Will check back on this Thursday 18th. ?

## 2021-08-16 NOTE — Telephone Encounter (Signed)
Pt's cardiac note is on your desk. Highlighted in yellow is their recommendations.

## 2021-08-29 NOTE — Telephone Encounter (Signed)
Reviewed both cardiac and pulmonary recommendations. Pulmonologist Dr. Raul Del states high risk for colonoscopy. Cardiologist highly discourages general anesthesia for colonoscopy.   I discussed with patient referring to tertiary care. She does not want to pursue any endoscopic evaluation at this time. States she knows she has "limited number of days". She is at peace with not investigating any of this further. She states her PCP is also aware and has discouraged pursuing at this time.  We will see her back as needed.   Stephanie Needs, PhD, ANP-BC [facility]

## 2021-12-06 ENCOUNTER — Ambulatory Visit
Admission: RE | Admit: 2021-12-06 | Discharge: 2021-12-06 | Disposition: A | Discharge status: Home / Self Care | Payer: Medicare Other | Source: Ambulatory Visit | Attending: Oncology | Admitting: Oncology

## 2021-12-06 DIAGNOSIS — R918 Other nonspecific abnormal finding of lung field: Secondary | ICD-10-CM | POA: Insufficient documentation

## 2021-12-06 LAB — POCT I-STAT CREATININE: Creatinine, Ser: 0.6 mg/dL (ref 0.44–1.00)

## 2021-12-06 MED ORDER — IOHEXOL 300 MG/ML  SOLN
75.0000 mL | Freq: Once | INTRAMUSCULAR | Status: AC | PRN
  Administered 2021-12-06: 75 mL via INTRAVENOUS

## 2021-12-08 ENCOUNTER — Ambulatory Visit: Payer: Medicare Other | Admitting: Oncology

## 2021-12-13 ENCOUNTER — Ambulatory Visit: Payer: Medicare Other | Admitting: Oncology

## 2021-12-13 ENCOUNTER — Inpatient Hospital Stay: Payer: Medicare Other | Attending: Oncology | Admitting: Medical Oncology

## 2021-12-13 ENCOUNTER — Encounter: Payer: Self-pay | Admitting: Medical Oncology

## 2021-12-13 ENCOUNTER — Ambulatory Visit
Admission: RE | Admit: 2021-12-13 | Discharge: 2021-12-13 | Disposition: A | Discharge status: Home / Self Care | Payer: Medicare Other | Source: Ambulatory Visit | Attending: Radiation Oncology | Admitting: Radiation Oncology

## 2021-12-13 VITALS — BP 113/61 | HR 89 | Temp 96.1°F | Wt 222.0 lb

## 2021-12-13 DIAGNOSIS — R933 Abnormal findings on diagnostic imaging of other parts of digestive tract: Secondary | ICD-10-CM | POA: Insufficient documentation

## 2021-12-13 DIAGNOSIS — Z923 Personal history of irradiation: Secondary | ICD-10-CM | POA: Diagnosis not present

## 2021-12-13 DIAGNOSIS — G8929 Other chronic pain: Secondary | ICD-10-CM

## 2021-12-13 DIAGNOSIS — R911 Solitary pulmonary nodule: Secondary | ICD-10-CM

## 2021-12-13 DIAGNOSIS — R1084 Generalized abdominal pain: Secondary | ICD-10-CM

## 2021-12-13 DIAGNOSIS — R195 Other fecal abnormalities: Secondary | ICD-10-CM | POA: Diagnosis not present

## 2021-12-13 DIAGNOSIS — Z9981 Dependence on supplemental oxygen: Secondary | ICD-10-CM | POA: Insufficient documentation

## 2021-12-13 DIAGNOSIS — K639 Disease of intestine, unspecified: Secondary | ICD-10-CM | POA: Insufficient documentation

## 2021-12-13 DIAGNOSIS — R918 Other nonspecific abnormal finding of lung field: Secondary | ICD-10-CM | POA: Insufficient documentation

## 2021-12-13 DIAGNOSIS — R1031 Right lower quadrant pain: Secondary | ICD-10-CM

## 2021-12-13 DIAGNOSIS — R935 Abnormal findings on diagnostic imaging of other abdominal regions, including retroperitoneum: Secondary | ICD-10-CM

## 2021-12-13 DIAGNOSIS — J449 Chronic obstructive pulmonary disease, unspecified: Secondary | ICD-10-CM | POA: Insufficient documentation

## 2021-12-13 MED ORDER — LEVOFLOXACIN 500 MG PO TABS: 500.0000 mg | ORAL_TABLET | Freq: Every day | ORAL | 0 refills | Status: AC

## 2021-12-13 NOTE — Progress Notes (Signed)
[place]  [phone] [phone]) [phone]  ID: Stephanie Ross OB: 1966/02/27  MR#: [id number]  YIF#:[id number]  Patient Care Team: [facility] as PCP - General Telford Nab, [place] as Oncology Nurse Navigator  CHIEF COMPLAINT: FDG avid left lower lobe lung mass.  INTERVAL HISTORY: Patient returns to clinic today for further evaluation and discussion of her recent PET scan is a 6-month follow-up to her lung mass.  She reports that over the past few days she has noticed slightly worsened cough and slightly thicker mucus.  Mucus is nonbloody in nature.  She denies any fevers, new shortness of breath or any chest pain.  She wonders if she might be developing pneumonia.  In terms of her additional symptoms she states that the area of the abdominal pain that was previously noted to have a kidney cyst versus mass is causing her more pain as of the last few weeks but denies any unintentional weight loss, stool changes, constipation, vomiting. she continues to have shortness of breath and requires oxygen 24 hours today, but otherwise feels well and is asymptomatic.  She does not complain of any weakness or fatigue today.  She has no neurologic complaints.  She denies any recent fevers.  She has a fair appetite, but denies weight loss.  She denies any chest pain or hemoptysis.  She has no nausea, vomiting, constipation, or diarrhea.  She has no urinary complaints.  Patient offers no further specific complaints today.  REVIEW OF SYSTEMS:   Review of Systems  Constitutional: Negative.  Negative for fever, malaise/fatigue and weight loss.  Respiratory:  Positive for cough, sputum production and shortness of breath. Negative for hemoptysis and wheezing.   Cardiovascular: Negative.  Negative for chest pain and leg swelling.  Gastrointestinal:  Positive for abdominal pain. Negative for blood in stool, constipation, diarrhea, melena, nausea and  vomiting.  Genitourinary: Negative.  Negative for dysuria.  Musculoskeletal: Negative.  Negative for back pain.  Skin: Negative.  Negative for rash.  Neurological: Negative.  Negative for dizziness, focal weakness, weakness and headaches.  Psychiatric/Behavioral: Negative.  The patient is not nervous/anxious.     As per HPI. Otherwise, a complete review of systems is negative.  PAST MEDICAL HISTORY: Past Medical History:  Diagnosis Date   ADHD (attention deficit hyperactivity disorder)    Anxiety    Arthritis    Asthma    Bipolar disorder ([place])    Cancer (HCC)    CHF (congestive heart failure) (HCC)    COPD (chronic obstructive pulmonary disease) (HCC)    Coronary artery disease    Depression    Dyspnea    GERD (gastroesophageal reflux disease)    Hypertension    Peripheral vascular disease (HCC)    Pneumonia    Stroke ([place])     PAST SURGICAL HISTORY: Past Surgical History:  Procedure Laterality Date   BILATERAL CARPAL TUNNEL RELEASE     CERVICAL ABLATION     ROTATOR CUFF REPAIR Right     FAMILY HISTORY: Family History  Problem Relation Age of Onset   Colon polyps Father    Breast cancer Sister    Colon polyps Sister    Breast cancer Paternal Grandmother    Colon cancer Neg Hx     ADVANCED DIRECTIVES (Y/N):  N  HEALTH MAINTENANCE: Social History   Tobacco Use   Smoking status: Former    Packs/day: 0.25    Types: Cigarettes    Quit date: 07/17/2019  Years since quitting: 2.4   Smokeless tobacco: Never  Vaping Use   Vaping Use: Never used  Substance Use Topics   Alcohol use: No   Drug use: Not Currently    Types: Marijuana    Comment: occasionally     Colonoscopy:  PAP:  Bone density:  Lipid panel:  Allergies  Allergen Reactions   Amoxicillin Other (See Comments)    Has patient had a PCN reaction causing immediate rash, facial/tongue/throat swelling, SOB or lightheadedness with hypotension: Yes Has patient had a PCN reaction causing severe  rash involving mucus membranes or skin necrosis: Yes Has patient had a PCN reaction that required hospitalization:yes Has patient had a PCN reaction occurring within the last 10 years: yes If all of the above answers are "NO", then may proceed with Cephalosporin use.    Prednisone Other (See Comments)    Full body rash    Doxycycline Other (See Comments)    Other reaction(s): Other (See Comments) "caused fluid in lungs" "caused fluid in lungs"    Metoprolol     Other reaction(s): Dizziness   Other Other (See Comments)    Tide detergent - rash    Current Outpatient Medications  Medication Sig Dispense Refill   albuterol (PROVENTIL HFA;VENTOLIN HFA) 108 (90 Base) MCG/ACT inhaler Inhale 2 puffs into the lungs every 4 (four) hours as needed for wheezing or shortness of breath. 1 Inhaler 3   albuterol (PROVENTIL) (2.5 MG/3ML) 0.083% nebulizer solution Take 2.5 mg by nebulization every 6 (six) hours as needed for wheezing or shortness of breath.     aspirin 81 MG chewable tablet Chew 81 mg by mouth daily.     atorvastatin (LIPITOR) 40 MG tablet Take 40 mg by mouth daily.     baclofen (LIORESAL) 10 MG tablet Take 10 mg by mouth 3 (three) times daily as needed for muscle spasms.     budesonide-formoterol (SYMBICORT) 160-4.5 MCG/ACT inhaler Inhale 2 puffs into the lungs 2 (two) times daily.     cetirizine (ZYRTEC) 10 MG tablet Take 10 mg by mouth daily.     clopidogrel (PLAVIX) 75 MG tablet Take 75 mg by mouth daily.     Coenzyme Q10 (COQ10) 100 MG CAPS Take 100 mg by mouth daily.      DALIRESP 500 MCG TABS tablet Take 500 mcg by mouth daily.     ergocalciferol (VITAMIN D2) 1.25 MG (50000 UT) capsule Take 50,000 Units by mouth every Monday.     esomeprazole (NEXIUM) 20 MG capsule Take 20 mg by mouth daily at 12 noon.     furosemide (LASIX) 20 MG tablet Take 20 mg by mouth every other day.     levofloxacin (LEVAQUIN) 500 MG tablet Take 1 tablet (500 mg total) by mouth daily for 7 days. 7  tablet 0   losartan (COZAAR) 25 MG tablet Take 25 mg by mouth daily.     meloxicam (MOBIC) 7.5 MG tablet 1 tablet     montelukast (SINGULAIR) 10 MG tablet Take 10 mg by mouth at bedtime.     nortriptyline (PAMELOR) 10 MG capsule Take 20 mg by mouth daily.     tiotropium (SPIRIVA) 18 MCG inhalation capsule Place 1 capsule (18 mcg total) into inhaler and inhale daily. 30 capsule 12   No current facility-administered medications for this visit.    OBJECTIVE: Vitals:   12/13/21 1005  BP: 113/61  Pulse: 89  Temp: (!) 96.1 F (35.6 C)     Body mass index is  41.27 kg/m.    ECOG FS:2 - Symptomatic, <50% confined to bed  General: Well-developed, well-nourished, no acute distress.  Sitting in a wheelchair. Eyes: Pink conjunctiva, anicteric sclera. HEENT: Normocephalic, moist mucous membranes. Lungs: Clear to auscultation bilaterally.  Scant rhonchi of the right lower lobe.  No wheezes or rails. Heart: Regular rate and rhythm. Abdomen: Soft, scant tenderness throughout to deep palpation, no obvious distention. Musculoskeletal: No edema, cyanosis, or clubbing. Neuro: Alert, answering all questions appropriately. Cranial nerves grossly intact. Skin: No rashes or petechiae noted. Psych: Normal affect.  LAB RESULTS:  Lab Results  Component Value Date   NA 132 (L) 11/21/2020   K 4.4 11/21/2020   CL 91 (L) 11/21/2020   CO2 35 (H) 11/21/2020   GLUCOSE 159 (H) 11/21/2020   BUN 12 11/21/2020   CREATININE 0.60 12/06/2021   CALCIUM 8.8 (L) 11/21/2020   PROT 8.1 11/19/2020   ALBUMIN 3.7 11/19/2020   AST 16 11/19/2020   ALT 17 11/19/2020   ALKPHOS 119 11/19/2020   BILITOT 1.2 11/19/2020   GFRNONAA >60 11/21/2020   GFRAA >60 10/15/2019    Lab Results  Component Value Date   WBC 18.3 (H) 11/20/2020   NEUTROABS 7.2 10/06/2020   HGB 10.9 (L) 11/20/2020   HCT 33.8 (L) 11/20/2020   MCV 85.1 11/20/2020   PLT 242 11/20/2020     STUDIES: CT CHEST W CONTRAST  Result Date:  12/07/2021 CLINICAL DATA:  Lung cancer, follow-up.  * Tracking Code: BO * EXAM: CT CHEST WITH CONTRAST TECHNIQUE: Multidetector CT imaging of the chest was performed during intravenous contrast administration. RADIATION DOSE REDUCTION: This exam was performed according to the departmental dose-optimization program which includes automated exposure control, adjustment of the mA and/or kV according to patient size and/or use of iterative reconstruction technique. CONTRAST:  37mL OMNIPAQUE IOHEXOL 300 MG/ML  SOLN COMPARISON:  Multiple priors including most recent PET-CT May 26, 2021. FINDINGS: Cardiovascular: Aortic atherosclerosis. No central pulmonary embolus on this nondedicated study. Left ventricular enlargement. Coronary artery calcifications. No significant pericardial effusion/thickening. Mediastinum/Nodes: No suspicious thyroid nodule. No pathologically enlarged mediastinal, hilar or axillary lymph nodes. The esophagus is grossly unremarkable. Lungs/Pleura: No significant interval change in the bandlike area of scarring/atelectasis in the paramedian left lower lobe adjacent to the aorta for instance on image 75/3 which did not demonstrate significant abnormal hypermetabolic activity on recent PET-CT corresponding in location with the previously treated cavitary left lower lobe lung mass. Scarring/atelectasis in the lingula and right middle lobe are similar prior. Subpleural clustered micronodularity in the right-greater-than-left lung bases measures up to 2 mm for instance on image 108/3. Centrilobular and paraseptal emphysema with diffuse bronchial wall thickening. Upper Abdomen: No acute abnormality. Musculoskeletal: No aggressive lytic or blastic lesion of bone. IMPRESSION: 1. Similar appearance of the bandlike area of scarring/atelectasis in the paramedian left lower lobe corresponding in location with the treated cavitary lung mass. 2. No convincing evidence of thoracic metastatic disease. 3. New  subpleural peripheral micro nodularity in the right-greater-than-left lung bases measuring up to 2 mm is favored infectious or inflammatory. Attention on short-term interval follow-up imaging suggested. 4. Aortic Atherosclerosis (ICD10-I70.0) and Emphysema (ICD10-J43.9). Electronically Signed   By: Dahlia Bailiff M.D.   On: 12/07/2021 09:52    ASSESSMENT: FDG avid left lower lobe lung mass.  PLAN:    FDG avid left lower lobe lung mass: Highly suspicious for underlying malignancy.  PET scan results from December 08, 2020 reviewed independently with a 3.5 cm hypermetabolic mass with  no mediastinal or hilar adenopathy noted.  Given patient's underlying pulmonary and cardiac disease, she was not a surgical candidate and was unable to undergo EBUS/bronchoscopy for possible biopsy and lymph node sampling secondary to high risk with anesthesia.  Patient proceeded directly to XRT which she completed in approximately December 2022.  Today we reviewed her recent CT scan from 12/06/2021 which showed no changes at the area of her left lower lobe concerning for recurrence.  There is a very small micro density of approximately 2 mm in the right lung base which was favored to be infectious on her CT scan.  Combined with patient's symptoms this likely is a early developing pneumonia.  Treating with Levaquin given her allergies and history.  Discussed red flag signs and symptoms.  She will follow-up next week if symptoms fail to improve.  Return to clinic in 3 months with repeat imaging using CT scan and further evaluation per radiology recommendation given this new change, likely infectious in nature..   PET positive sigmoid colon lesion: Patient continues to have pain in this area and would like additional imaging which I think is reasonable.  She will continue follow-up with GI.  CT scan pending.  Discussed red flag signs and symptoms that would warrant ER evaluation.  She is not currently having any signs of  obstruction.    Patient expressed understanding and was in agreement with this plan. She also understands that She can call clinic at any time with any questions, concerns, or complaints.    Cancer Staging  No matching staging information was found for the patient.  Hughie Closs, PA-C   12/13/2021 11:46 AM

## 2021-12-13 NOTE — Progress Notes (Signed)
Radiation Oncology Follow up Note  Name: Stephanie Ross   Date:   12/13/2021 MRN:  [id number] DOB: 05/27/1965    This 56 y.o. female presents to the clinic today for 64-month follow-up status post SBRT to her left lower lobe for presumed stage Ib (T2 N0 M0) non-small cell lung cancer patient oxygen dependent severe COPD.  REFERRING PROVIDER: [facility]  HPI: Patient is a 56 year old female now out 9 months having completed SBRT to her left lower lobe for presumed stage Ib non-small cell lung cancer.  Seen today in routine follow-up she is doing fairly well still is oxygen dependent.  Specifically [place] cough hemoptysis or chest tightness..  She had a recent CT scan of her chest showing similar appearance of the bandlike scarring in the left lower lobe consistent Rosey Bath corresponding to area of SBRT treatment.  No evidence of thoracic metastatic disease.  She has new subpleural peripheral micronodularity in the right greater than the left lung bases measuring up to 2 mm.  I believe she is being put on empiric antibiotics for that.  COMPLICATIONS OF TREATMENT: none  FOLLOW UP COMPLIANCE: keeps appointments   PHYSICAL EXAM:  There were no vitals taken for this visit. Wheelchair-bound female in NAD on nasal oxygen.  Well-developed well-nourished patient in NAD. HEENT reveals PERLA, EOMI, discs not visualized.  Oral cavity is clear. No oral mucosal lesions are identified. Neck is clear without evidence of cervical or supraclavicular adenopathy. Lungs are clear to A&P. Cardiac examination is essentially unremarkable with regular rate and rhythm without murmur rub or thrill. Abdomen is benign with no organomegaly or masses noted. Motor sensory and DTR levels are equal and symmetric in the upper and lower extremities. Cranial nerves II through XII are grossly intact. Proprioception is intact. No peripheral adenopathy or edema is identified. No motor or sensory levels are noted. Crude  visual fields are within normal range.  RADIOLOGY RESULTS: CT scan reviewed compatible with above-stated findings  PLAN: Present time patient is doing well.  And pleased with her CT findings of her previous treated lesion.  I believe to be put on empiric antibiotics for the subpleural micronodularity in her lung bases.  I have asked to see her back in 6 months with a follow-up CT scan.  Patient is to call with any concern.  I would like to take this opportunity to thank you for allowing me to participate in the care of your patient.Noreene Filbert, MD

## 2021-12-14 ENCOUNTER — Ambulatory Visit
Admission: RE | Admit: 2021-12-14 | Discharge: 2021-12-14 | Disposition: A | Discharge status: Home / Self Care | Payer: Medicare Other | Source: Ambulatory Visit | Attending: Medical Oncology | Admitting: Medical Oncology

## 2021-12-14 DIAGNOSIS — G8929 Other chronic pain: Secondary | ICD-10-CM | POA: Diagnosis present

## 2021-12-14 DIAGNOSIS — R1031 Right lower quadrant pain: Secondary | ICD-10-CM | POA: Diagnosis not present

## 2021-12-14 LAB — POCT I-STAT CREATININE: Creatinine, Ser: 0.6 mg/dL (ref 0.44–1.00)

## 2021-12-14 MED ORDER — IOHEXOL 300 MG/ML  SOLN
100.0000 mL | Freq: Once | INTRAMUSCULAR | Status: AC | PRN
  Administered 2021-12-14: 100 mL via INTRAVENOUS

## 2021-12-19 ENCOUNTER — Other Ambulatory Visit: Payer: Self-pay | Admitting: Family Medicine

## 2021-12-19 ENCOUNTER — Other Ambulatory Visit (HOSPITAL_COMMUNITY): Payer: Self-pay | Admitting: Family Medicine

## 2021-12-19 DIAGNOSIS — M62838 Other muscle spasm: Secondary | ICD-10-CM

## 2021-12-19 DIAGNOSIS — M79602 Pain in left arm: Secondary | ICD-10-CM

## 2021-12-27 ENCOUNTER — Ambulatory Visit (HOSPITAL_COMMUNITY)
Admission: RE | Admit: 2021-12-27 | Discharge: 2021-12-27 | Disposition: A | Discharge status: Home / Self Care | Payer: Medicare Other | Source: Ambulatory Visit | Attending: Family Medicine | Admitting: Family Medicine

## 2021-12-27 DIAGNOSIS — M62838 Other muscle spasm: Secondary | ICD-10-CM

## 2021-12-27 DIAGNOSIS — M79602 Pain in left arm: Secondary | ICD-10-CM

## 2022-01-03 ENCOUNTER — Emergency Department
Admission: EM | Admit: 2022-01-03 | Discharge: 2022-01-04 | Discharge status: Against Medical Advice | Payer: Medicare Other | Attending: Physician Assistant | Admitting: Physician Assistant

## 2022-01-03 ENCOUNTER — Emergency Department: Payer: Medicare Other

## 2022-01-03 ENCOUNTER — Other Ambulatory Visit: Payer: Self-pay

## 2022-01-03 ENCOUNTER — Encounter: Payer: Self-pay | Admitting: *Deleted

## 2022-01-03 DIAGNOSIS — M79641 Pain in right hand: Secondary | ICD-10-CM | POA: Insufficient documentation

## 2022-01-03 DIAGNOSIS — Z5321 Procedure and treatment not carried out due to patient leaving prior to being seen by health care provider: Secondary | ICD-10-CM | POA: Insufficient documentation

## 2022-01-03 LAB — CBC WITH DIFFERENTIAL/PLATELET
Abs Immature Granulocytes: 0.09 10*3/uL — ABNORMAL HIGH (ref 0.00–0.07)
Basophils Absolute: 0 10*3/uL (ref 0.0–0.1)
Basophils Relative: 0 %
Eosinophils Absolute: 0.1 10*3/uL (ref 0.0–0.5)
Eosinophils Relative: 0 %
HCT: 28.2 % — ABNORMAL LOW (ref 36.0–46.0)
Hemoglobin: 7.2 g/dL — ABNORMAL LOW (ref 12.0–15.0)
Immature Granulocytes: 1 %
Lymphocytes Relative: 7 %
Lymphs Abs: 1 10*3/uL (ref 0.7–4.0)
MCH: 16.2 pg — ABNORMAL LOW (ref 26.0–34.0)
MCHC: 25.5 g/dL — ABNORMAL LOW (ref 30.0–36.0)
MCV: 63.5 fL — ABNORMAL LOW (ref 80.0–100.0)
Monocytes Absolute: 1 10*3/uL (ref 0.1–1.0)
Monocytes Relative: 7 %
Neutro Abs: 12.6 10*3/uL — ABNORMAL HIGH (ref 1.7–7.7)
Neutrophils Relative %: 85 %
Platelets: 397 10*3/uL (ref 150–400)
RBC: 4.44 MIL/uL (ref 3.87–5.11)
RDW: 22.6 % — ABNORMAL HIGH (ref 11.5–15.5)
Smear Review: NORMAL
WBC: 14.8 10*3/uL — ABNORMAL HIGH (ref 4.0–10.5)
nRBC: 0.1 % (ref 0.0–0.2)

## 2022-01-03 LAB — BASIC METABOLIC PANEL
Anion gap: 11 (ref 5–15)
BUN: 7 mg/dL (ref 6–20)
CO2: 30 mmol/L (ref 22–32)
Calcium: 9.1 mg/dL (ref 8.9–10.3)
Chloride: 93 mmol/L — ABNORMAL LOW (ref 98–111)
Creatinine, Ser: 0.62 mg/dL (ref 0.44–1.00)
GFR, Estimated: >60 mL/min (ref >60)
Glucose, Bld: 275 mg/dL — ABNORMAL HIGH (ref 70–99)
Potassium: 4 mmol/L (ref 3.5–5.1)
Sodium: 134 mmol/L — ABNORMAL LOW (ref 135–145)

## 2022-01-03 LAB — LACTIC ACID, PLASMA: Lactic Acid, Venous: 2.9 mmol/L (ref 0.5–1.9)

## 2022-01-03 MED ORDER — OXYCODONE-ACETAMINOPHEN 5-325 MG PO TABS: 1.0000 | ORAL_TABLET | Freq: Once | ORAL | Status: DC

## 2022-01-03 NOTE — ED Notes (Signed)
No answer when called several times from lobby 

## 2022-01-03 NOTE — ED Provider Triage Note (Signed)
Emergency Medicine Provider Triage Evaluation Note  Stephanie Ross, a 56 y.o. female  was evaluated in triage.  Pt complains of right hand pain, warmth, and swelling.  Reports 2 days of intermittent hand swelling and pain but denies any preceding injury, trauma, or fall.  She reports some dorsal redness and pain with range of motion.  She denies any fevers, chills, or sweats.  Review of Systems  Positive: Right hand swelling Negative: FCS  Physical Exam  There were no vitals taken for this visit. Gen:   Awake, no distress  NAD Resp:  Normal effort CTA MSK:   Moves extremities without difficulty Right hand with dorsal erythema and STS Other:    Medical Decision Making  Medically screening exam initiated at 3:28 PM.  Appropriate orders placed.  [name] was informed that the remainder of the evaluation will be completed by another provider, this initial triage assessment does not replace that evaluation, and the importance of remaining in the ED until their evaluation is complete.  Patient to the ED for evaluation of right hand dorsal swelling and warmth.  She reports symptoms 2 days prior but denies any preceding injury, trauma, or insect bite.  Patient denies any fevers, chills, or sweats.  She is on Plavix and aspirin secondary to CVA.   Melvenia Needles, PA-C 01/03/22 1534

## 2022-01-03 NOTE — ED Triage Notes (Signed)
Pt has pain and swelling to right hand.  Pt has redness to top of right hand.  Hand hot to touch.  Sx began yesterday with swelling, redness today.  No known injury  limited rom  pt alert.  Hx copd  pt on 3 liters oxygen.  Pt alert  speech clear.

## 2022-01-03 NOTE — ED Notes (Addendum)
No answer when called several times from lobby; called phone # listed in chart with no answer

## 2022-01-04 ENCOUNTER — Other Ambulatory Visit: Payer: Self-pay

## 2022-01-04 ENCOUNTER — Telehealth: Payer: Self-pay | Admitting: Medical Oncology

## 2022-01-04 ENCOUNTER — Emergency Department (HOSPITAL_COMMUNITY)
Admission: EM | Admit: 2022-01-04 | Discharge: 2022-01-04 | Disposition: A | Discharge status: Home / Self Care | Payer: Medicare Other | Attending: Student | Admitting: Student

## 2022-01-04 ENCOUNTER — Encounter (HOSPITAL_COMMUNITY): Payer: Self-pay | Admitting: *Deleted

## 2022-01-04 DIAGNOSIS — Z7982 Long term (current) use of aspirin: Secondary | ICD-10-CM | POA: Insufficient documentation

## 2022-01-04 DIAGNOSIS — I2510 Atherosclerotic heart disease of native coronary artery without angina pectoris: Secondary | ICD-10-CM | POA: Diagnosis not present

## 2022-01-04 DIAGNOSIS — Z87891 Personal history of nicotine dependence: Secondary | ICD-10-CM | POA: Diagnosis not present

## 2022-01-04 DIAGNOSIS — J441 Chronic obstructive pulmonary disease with (acute) exacerbation: Secondary | ICD-10-CM | POA: Diagnosis not present

## 2022-01-04 DIAGNOSIS — Z7902 Long term (current) use of antithrombotics/antiplatelets: Secondary | ICD-10-CM | POA: Diagnosis not present

## 2022-01-04 DIAGNOSIS — M79641 Pain in right hand: Secondary | ICD-10-CM | POA: Diagnosis present

## 2022-01-04 DIAGNOSIS — Z79899 Other long term (current) drug therapy: Secondary | ICD-10-CM | POA: Diagnosis not present

## 2022-01-04 DIAGNOSIS — Z85118 Personal history of other malignant neoplasm of bronchus and lung: Secondary | ICD-10-CM | POA: Diagnosis not present

## 2022-01-04 DIAGNOSIS — Z7951 Long term (current) use of inhaled steroids: Secondary | ICD-10-CM | POA: Insufficient documentation

## 2022-01-04 DIAGNOSIS — I509 Heart failure, unspecified: Secondary | ICD-10-CM | POA: Diagnosis not present

## 2022-01-04 DIAGNOSIS — I110 Hypertensive heart disease with heart failure: Secondary | ICD-10-CM | POA: Insufficient documentation

## 2022-01-04 DIAGNOSIS — L0390 Cellulitis, unspecified: Secondary | ICD-10-CM

## 2022-01-04 DIAGNOSIS — J45909 Unspecified asthma, uncomplicated: Secondary | ICD-10-CM | POA: Diagnosis not present

## 2022-01-04 DIAGNOSIS — D649 Anemia, unspecified: Secondary | ICD-10-CM

## 2022-01-04 HISTORY — DX: Dependence on supplemental oxygen: Z99.81

## 2022-01-04 LAB — CBC WITH DIFFERENTIAL/PLATELET
Abs Immature Granulocytes: 0.05 10*3/uL (ref 0.00–0.07)
Basophils Absolute: 0 10*3/uL (ref 0.0–0.1)
Basophils Relative: 0 %
Eosinophils Absolute: 0.1 10*3/uL (ref 0.0–0.5)
Eosinophils Relative: 1 %
HCT: 26.2 % — ABNORMAL LOW (ref 36.0–46.0)
Hemoglobin: 7.1 g/dL — ABNORMAL LOW (ref 12.0–15.0)
Immature Granulocytes: 1 %
Lymphocytes Relative: 9 %
Lymphs Abs: 0.9 10*3/uL (ref 0.7–4.0)
MCH: 17.1 pg — ABNORMAL LOW (ref 26.0–34.0)
MCHC: 27.1 g/dL — ABNORMAL LOW (ref 30.0–36.0)
MCV: 63 fL — ABNORMAL LOW (ref 80.0–100.0)
Monocytes Absolute: 0.9 10*3/uL (ref 0.1–1.0)
Monocytes Relative: 9 %
Neutro Abs: 8.3 10*3/uL — ABNORMAL HIGH (ref 1.7–7.7)
Neutrophils Relative %: 80 %
Platelets: 367 10*3/uL (ref 150–400)
RBC: 4.16 MIL/uL (ref 3.87–5.11)
RDW: 22.7 % — ABNORMAL HIGH (ref 11.5–15.5)
WBC: 10.3 10*3/uL (ref 4.0–10.5)
nRBC: 0 % (ref 0.0–0.2)

## 2022-01-04 LAB — COMPREHENSIVE METABOLIC PANEL
ALT: 17 U/L (ref 0–44)
AST: 14 U/L — ABNORMAL LOW (ref 15–41)
Albumin: 3.6 g/dL (ref 3.5–5.0)
Alkaline Phosphatase: 106 U/L (ref 38–126)
Anion gap: 11 (ref 5–15)
BUN: 10 mg/dL (ref 6–20)
CO2: 30 mmol/L (ref 22–32)
Calcium: 8.8 mg/dL — ABNORMAL LOW (ref 8.9–10.3)
Chloride: 94 mmol/L — ABNORMAL LOW (ref 98–111)
Creatinine, Ser: 0.67 mg/dL (ref 0.44–1.00)
GFR, Estimated: >60 mL/min (ref >60)
Glucose, Bld: 310 mg/dL — ABNORMAL HIGH (ref 70–99)
Potassium: 3.8 mmol/L (ref 3.5–5.1)
Sodium: 135 mmol/L (ref 135–145)
Total Bilirubin: 0.8 mg/dL (ref 0.3–1.2)
Total Protein: 7.6 g/dL (ref 6.5–8.1)

## 2022-01-04 LAB — TYPE AND SCREEN
ABO/RH(D): A POS
Antibody Screen: NEGATIVE

## 2022-01-04 LAB — PROTIME-INR
INR: 1.1 (ref 0.8–1.2)
Prothrombin Time: 14.2 seconds (ref 11.4–15.2)

## 2022-01-04 LAB — ABO/RH: ABO/RH(D): A POS

## 2022-01-04 LAB — APTT: aPTT: 33 seconds (ref 24–36)

## 2022-01-04 LAB — LACTIC ACID, PLASMA
Lactic Acid, Venous: 1.9 mmol/L (ref 0.5–1.9)
Lactic Acid, Venous: 1.3 mmol/L (ref 0.5–1.9)

## 2022-01-04 MED ORDER — HYDROCODONE-ACETAMINOPHEN 5-325 MG PO TABS
1.0000 | ORAL_TABLET | Freq: Once | ORAL | Status: AC
  Administered 2022-01-04: 1 via ORAL
  Filled 2022-01-04: qty 1

## 2022-01-04 MED ORDER — SODIUM CHLORIDE 0.9 % IV SOLN
2.0000 g | Freq: Once | INTRAVENOUS | Status: AC
  Administered 2022-01-04: 2 g via INTRAVENOUS
  Filled 2022-01-04: qty 20

## 2022-01-04 MED ORDER — CEFADROXIL 500 MG PO CAPS: 500.0000 mg | ORAL_CAPSULE | Freq: Two times a day (BID) | ORAL | 0 refills | Status: AC

## 2022-01-04 NOTE — Inpatient Diabetes Management (Signed)
Inpatient Diabetes Program Recommendations  AACE/ADA: New Consensus Statement on Inpatient Glycemic Control   Target Ranges:  Prepandial:   less than 140 mg/dL      Peak postprandial:   less than 180 mg/dL (1-2 hours)      Critically ill patients:  140 - 180 mg/dL    Latest Reference Range & Units 01/03/22 15:32 01/04/22 11:11  Glucose 70 - 99 mg/dL 275 (H) 310 (H)    Latest Reference Range & Units 08/17/19 05:26 11/19/20 15:18  Hemoglobin A1C 4.8 - 5.6 % 5.6 6.7 (H)   Review of Glycemic Control  Diabetes history: Pre-DM Outpatient Diabetes medications: None Current orders for Inpatient glycemic control: None; in ED  NOTE: Patient in ED with right hand pain and swelling.Patient went to [facility] ED on 01/03/22 and left before being seen. Noted glucose 275 mg/dl on 01/03/22.  On  labs today,  glucose 310 mg/dl. Spoke with patient at bedside to  inquire about any DM hx. Patient states that she had gestational diabetes in the past which was managed with dietary changes. Patient states that her PCP told her within the past 6 months that she had prediabetes. Patient  states she does not think she has received or took any steroids in the past 3 months. Discussed lab glucose of 275 mg/dl on 01/03/22 and 310 mg/dl today. Patient is being discharged home from ED. Dicussed concern about hyperglycemia and asked that patient call her PCP office today and see if  she can get an appointment to be seen regarding hyperglycemia. Encouraged patient to follow carb modified diet. Discussed how hyperglycemia can impact wound healing and infection and stressed that she discuss elevated glucose with PCP sooner rather than later. Patient reports that she will call and follow up with PCP as soon as she can. Sent chat to Dr.  Matilde Sprang and Rennie Natter regarding hyperglycemia and discussion with patient.   Thanks, Barnie Alderman, RN, MSN, [place] Diabetes Coordinator Inpatient Diabetes Program [phone] (Team Pager from 8am to  [place])

## 2022-01-04 NOTE — ED Provider Notes (Addendum)
[facility] EMERGENCY DEPARTMENT Provider Note  CSN: [id number] Arrival date & time: 01/04/22 1601  Chief Complaint(s) Hand Problem  HPI Stephanie Ross is a 56 y.o. female with PMH asthma, bipolar disorder, left-sided lung cancer, CHF, COPD on 3 L home O2, HTN, PVD, previous CVA with left-sided deficits who presents to the emergency department for evaluation of right hand pain and swelling.  Patient states that symptoms have been gradually worsening over the last 48 hours and she went to [facility] emergency department yesterday but left due to long wait time.  She did have x-ray imaging at that time that was reassuringly normal but laboratory evaluation showed a significant elevated lactate at 3.9 with a leukocytosis.  She received a call about her abnormal labs this morning and came to the emergency department here for further evaluation.  She states that the pain initially began in the palm, but this has dissipated and now she has had gradually worsening erythema and swelling at the dorsum of the hand extending up into the dorsal forearm.  She denies fever, chest pain, shortness of breath, abdominal pain, nausea, vomiting or other systemic symptoms.   Past Medical History Past Medical History:  Diagnosis Date   ADHD (attention deficit hyperactivity disorder)    Anxiety    Arthritis    Asthma    Bipolar disorder ([place])    Cancer ([place])    left lung cancer   CHF (congestive heart failure) (HCC)    COPD (chronic obstructive pulmonary disease) (HCC)    Coronary artery disease    Depression    Dyspnea    GERD (gastroesophageal reflux disease)    Hypertension    On home oxygen therapy    3L O2 via  continuously   Peripheral vascular disease ([place])    Pneumonia    Stroke ([place])    x 2 with left sided sensation deficits   Patient Active Problem List   Diagnosis Date Noted   Heme positive stool 06/09/2021   Mass of lower lobe of left lung 11/19/2020   Lobar pneumonia ([place])  11/19/2020   Sepsis ([place]) 11/19/2020   HLD (hyperlipidemia) 11/19/2020   COPD exacerbation ([place]) 11/19/2020   Stroke ([place]) 11/19/2020   Depression 11/19/2020   Acute on chronic respiratory failure with hypoxia ([place]) 11/19/2020   HTN (hypertension) 11/19/2020   Elevated troponin 11/19/2020   Malignant neoplasm of left lung [facility])    Pre-op evaluation    Essential hypertension    Aortic valve regurgitation    Acute appendicitis 10/14/2019   CVA (cerebral vascular accident) ([place]) 08/17/2019   Home Medication(s) Prior to Admission medications   Medication Sig Start Date End Date Taking? Authorizing Provider  albuterol (PROVENTIL HFA;VENTOLIN HFA) 108 (90 Base) MCG/ACT inhaler Inhale 2 puffs into the lungs every 4 (four) hours as needed for wheezing or shortness of breath. 05/07/17   Noemi Chapel, MD  albuterol (PROVENTIL) (2.5 MG/3ML) 0.083% nebulizer solution Take 2.5 mg by nebulization every 6 (six) hours as needed for wheezing or shortness of breath.    [provider]  aspirin 81 MG chewable tablet Chew 81 mg by mouth daily.    [provider]  atorvastatin (LIPITOR) 40 MG tablet Take 40 mg by mouth daily. 05/30/21   [provider]  baclofen (LIORESAL) 10 MG tablet Take 10 mg by mouth 3 (three) times daily as needed for muscle spasms. 10/26/20   [provider]  budesonide-formoterol (SYMBICORT) 160-4.5 MCG/ACT inhaler Inhale 2 puffs into the  lungs 2 (two) times daily.    [provider]  cetirizine (ZYRTEC) 10 MG tablet Take 10 mg by mouth daily.    [provider]  clopidogrel (PLAVIX) 75 MG tablet Take 75 mg by mouth daily. 09/08/19   [provider]  Coenzyme Q10 (COQ10) 100 MG CAPS Take 100 mg by mouth daily.     [provider]  DALIRESP 500 MCG TABS tablet Take 500 mcg by mouth daily. 11/15/20   [provider]  ergocalciferol (VITAMIN D2) 1.25 MG (50000 UT) capsule Take 50,000 Units by mouth every Monday.     [provider]  esomeprazole (NEXIUM) 20 MG capsule Take 20 mg by mouth daily at 12 noon.    [provider]  furosemide (LASIX) 20 MG tablet Take 20 mg by mouth every other day. 11/08/20   [provider]  losartan (COZAAR) 25 MG tablet Take 25 mg by mouth daily. 03/03/21   [provider]  meloxicam (MOBIC) 7.5 MG tablet 1 tablet 04/25/21   [provider]  montelukast (SINGULAIR) 10 MG tablet Take 10 mg by mouth at bedtime. 08/02/19   [provider]  nortriptyline (PAMELOR) 10 MG capsule Take 20 mg by mouth daily. 10/06/19   [provider]  tiotropium (SPIRIVA) 18 MCG inhalation capsule Place 1 capsule (18 mcg total) into inhaler and inhale daily. 10/06/20   Fisher, Linden Dolin, PA-C                                                                                                                                    Past Surgical History Past Surgical History:  Procedure Laterality Date   BILATERAL CARPAL TUNNEL RELEASE     CERVICAL ABLATION     Family History Family History  Problem Relation Age of Onset   Colon polyps Father    Breast cancer Sister    Colon polyps Sister    Breast cancer Paternal Grandmother    Colon cancer Neg Hx     Social History Social History   Tobacco Use   Smoking status: Former    Packs/day: 0.25    Types: Cigarettes    Quit date: 07/17/2019    Years since quitting: 2.4   Smokeless tobacco: Never  Vaping Use   Vaping Use: Former   Quit date: 06/26/2019  Substance Use Topics   Alcohol use: No   Drug use: Not Currently    Types: Marijuana    Comment: occasionally   Allergies Amoxicillin, Prednisone, Doxycycline, Metoprolol, and Other  Review of Systems Review of Systems  Musculoskeletal:  Positive for arthralgias.  Skin:  Positive for rash.    Physical Exam Vital Signs  I have reviewed the triage vital signs BP 112/67   Pulse (!) 110   Temp 98.1 F (36.7 C) (Oral)   Resp 18   Ht  5\' 2"  (1.575 m)   Wt 98.9 kg  SpO2 100%   BMI 39.87 kg/m   Physical Exam Vitals and nursing note reviewed.  Constitutional:      General: She is not in acute distress.    Appearance: She is well-developed.  HENT:     Head: Normocephalic and atraumatic.  Eyes:     Conjunctiva/sclera: Conjunctivae normal.  Cardiovascular:     Rate and Rhythm: Normal rate and regular rhythm.     Heart sounds: No murmur heard. Pulmonary:     Effort: Pulmonary effort is normal. No respiratory distress.     Breath sounds: Normal breath sounds.  Abdominal:     Palpations: Abdomen is soft.     Tenderness: There is no abdominal tenderness.  Musculoskeletal:        General: Swelling present.     Cervical back: Neck supple.  Skin:    General: Skin is warm and dry.     Capillary Refill: Capillary refill takes less than 2 seconds.     Findings: Erythema and rash present.  Neurological:     Mental Status: She is alert.  Psychiatric:        Mood and Affect: Mood normal.     ED Results and Treatments Labs (all labs ordered are listed, but only abnormal results are displayed) Labs Reviewed  COMPREHENSIVE METABOLIC PANEL - Abnormal; Notable for the following components:      Result Value   Chloride 94 (*)    Glucose, Bld 310 (*)    Calcium 8.8 (*)    AST 14 (*)    All other components within normal limits  CBC WITH DIFFERENTIAL/PLATELET - Abnormal; Notable for the following components:   Hemoglobin 7.1 (*)    HCT 26.2 (*)    MCV 63.0 (*)    MCH 17.1 (*)    MCHC 27.1 (*)    RDW 22.7 (*)    Neutro Abs 8.3 (*)    All other components within normal limits  CULTURE, BLOOD (ROUTINE X 2)  CULTURE, BLOOD (ROUTINE X 2)  LACTIC ACID, PLASMA  LACTIC ACID, PLASMA  PROTIME-INR  APTT  TYPE AND SCREEN                                                                                                                          Radiology DG Hand Complete Right  Result Date: 01/03/2022 CLINICAL DATA:   dorsal hand STS/redness EXAM: RIGHT HAND - COMPLETE 3+ VIEW COMPARISON:  None Available. FINDINGS: No cortical erosion or destruction. There is no evidence of fracture or dislocation. First carpometacarpal joint moderate degenerative changes. Soft tissues are unremarkable. No retained radiopaque foreign body. IMPRESSION: 1. No radiographic finding of osteomyelitis. 2.  No acute displaced fracture or dislocation. Electronically Signed   By: Iven Finn M.D.   On: 01/03/2022 16:08    Pertinent labs & imaging results that were available during my care of the patient were reviewed by me and considered in my medical decision making (see MDM for details).  Medications Ordered in ED Medications  HYDROcodone-acetaminophen (NORCO/VICODIN) 5-325 MG per tablet 1 tablet (has no administration in time range)  cefTRIAXone (ROCEPHIN) 2 g in sodium chloride 0.9 % 100 mL IVPB (0 g Intravenous Stopped 01/04/22 1354)                                                                                                                                     Procedures Procedures  (including critical care time)  Medical Decision Making / ED Course   This patient presents to the ED for concern of hand pain and swelling, fatigue, this involves an extensive number of treatment options, and is a complaint that carries with it a high risk of complications and morbidity.  The differential diagnosis includes cellulitis, septic wrist, fracture, sepsis, metastatic lung cancer, symptomatic anemia  MDM: Seen in the emergency room for evaluation of hand pain and swelling.  Physical exam with right wrist erythema and swelling that extends into the mid forearm that is tender to palpation concerning for cellulitis.  Laboratory evaluation with resolution of the patient's leukocytosis and lactic acidosis, but she does have a significant anemia to 7.1 with an MCV of 63.0 which is likely multifactorial but given a hotspot on her PET scan in  the colon and need for colonoscopy, I have concern for likely underlying malignancy and GI bleed as the source of her anemia.  Patient started on ceftriaxone for her cellulitis and I think the patient would benefit from hospital observation with repeat CBC to ensure the patient will not require blood transfusion and to ensure resolution of underlying cellulitis given her additional high risk comorbidities.  However, upon discussion with the hospitalist, the patient does not actually meet inpatient criteria for admission as she has not failed any outpatient antibiotics and she does have multiple outpatient providers that can follow her anemia closely.  On reevaluation of the patient, I do not think this plan is not unreasonable.  I then had an additional conversation with the patient who agrees with our plan today and she will be discharged with a course of [place] with outpatient follow-up.  She was given strict return precautions including worsening shortness of breath, fatigue, fevers, vomiting or symptoms of which she voiced understanding.  Patient then discharged   Additional history obtained: -Additional history obtained from husband -External records from outside source obtained and reviewed including: Chart review including previous notes, labs, imaging, consultation notes   Lab Tests: -I ordered, reviewed, and interpreted labs.   The pertinent results include:   Labs Reviewed  COMPREHENSIVE METABOLIC PANEL - Abnormal; Notable for the following components:      Result Value   Chloride 94 (*)    Glucose, Bld 310 (*)    Calcium 8.8 (*)    AST 14 (*)    All other components within normal limits  CBC WITH DIFFERENTIAL/PLATELET - Abnormal; Notable for the following components:  Hemoglobin 7.1 (*)    HCT 26.2 (*)    MCV 63.0 (*)    MCH 17.1 (*)    MCHC 27.1 (*)    RDW 22.7 (*)    Neutro Abs 8.3 (*)    All other components within normal limits  CULTURE, BLOOD (ROUTINE X 2)  CULTURE,  BLOOD (ROUTINE X 2)  LACTIC ACID, PLASMA  LACTIC ACID, PLASMA  PROTIME-INR  APTT  TYPE AND SCREEN      Medicines ordered and prescription drug management: Meds ordered this encounter  Medications   cefTRIAXone (ROCEPHIN) 2 g in sodium chloride 0.9 % 100 mL IVPB    Order Specific Question:   Antibiotic Indication:    Answer:   Cellulitis   HYDROcodone-acetaminophen (NORCO/VICODIN) 5-325 MG per tablet 1 tablet    -I have reviewed the patients home medicines and have made adjustments as needed  Critical interventions none   Cardiac Monitoring: The patient was maintained on a cardiac monitor.  I personally viewed and interpreted the cardiac monitored which showed an underlying rhythm of: NSR  Social Determinants of Health:  Factors impacting patients care include: none   Reevaluation: After the interventions noted above, I reevaluated the patient and found that they have :stayed the same  Co morbidities that complicate the patient evaluation  Past Medical History:  Diagnosis Date   ADHD (attention deficit hyperactivity disorder)    Anxiety    Arthritis    Asthma    Bipolar disorder ([place])    Cancer ([place])    left lung cancer   CHF (congestive heart failure) (HCC)    COPD (chronic obstructive pulmonary disease) ([place])    Coronary artery disease    Depression    Dyspnea    GERD (gastroesophageal reflux disease)    Hypertension    On home oxygen therapy    3L O2 via [place] continuously   Peripheral vascular disease ([place])    Pneumonia    Stroke ([place])    x 2 with left sided sensation deficits      Dispostion: I considered admission for this patient, but she currently does not meet inpatient criteria for admission and is safe for discharge with outpatient follow-up, antibiotics,  and return precautions      Final Clinical Impression(s) / ED Diagnoses Final diagnoses:  None     @PCDICTATION @    Carlen Rebuck, Debe Coder, MD 01/04/22 [place], [place],  MD 01/04/22 2130

## 2022-01-04 NOTE — Telephone Encounter (Signed)
Attempted to call patient to f/u with [facility] from ED yesterday. Noted critical labs. Called to assure pt had a plan for follow up care. Pts voicemail full and was unable to leave message.

## 2022-01-04 NOTE — ED Triage Notes (Signed)
Pt has pain, swelling, redness and warmth to right hand x 2 days. Denies injury. Pt reports she was at [facility] ED yesterday and had an xray and labs drawn. She ended up leaving before being brought back to a room because of the wait time. She was called last night around midnight from the hospital but reports she was asleep and wasn't able to answer the phone so she wasn't sure what they were calling to tell her.

## 2022-01-06 ENCOUNTER — Telehealth: Payer: Self-pay | Admitting: *Deleted

## 2022-01-06 DIAGNOSIS — D509 Iron deficiency anemia, unspecified: Secondary | ICD-10-CM

## 2022-01-06 DIAGNOSIS — R195 Other fecal abnormalities: Secondary | ICD-10-CM

## 2022-01-06 DIAGNOSIS — R935 Abnormal findings on diagnostic imaging of other abdominal regions, including retroperitoneum: Secondary | ICD-10-CM

## 2022-01-06 DIAGNOSIS — R1084 Generalized abdominal pain: Secondary | ICD-10-CM

## 2022-01-06 NOTE — Telephone Encounter (Signed)
Orders entered

## 2022-01-06 NOTE — Telephone Encounter (Addendum)
Patient called stating that Dr Tinnie Gens was to have reached out to Dr Grayland Ormond regarding her labs at recent ER visit on 01/03/22, She is waiting to hear from our office. Her next appointment with Dr Grayland Ormond is 03/23/22.  "MDM: Seen in the emergency room for evaluation of hand pain and swelling.  Physical exam with right wrist erythema and swelling that extends into the mid forearm that is tender to palpation concerning for cellulitis.  Laboratory evaluation with resolution of the patient's leukocytosis and lactic acidosis, but she does have a significant anemia to 7.1 with an MCV of 63.0 which is likely multifactorial but given a hotspot on her PET scan in the colon and need for colonoscopy, I have concern for likely underlying malignancy and GI bleed as the source of her anemia.  Patient started on ceftriaxone for her cellulitis and I think the patient would benefit from hospital observation with repeat CBC to ensure the patient will not require blood transfusion and to ensure resolution of underlying cellulitis given her additional high risk comorbidities.  However, upon discussion with the hospitalist, the patient does not actually meet inpatient criteria for admission as she has not failed any outpatient antibiotics and she does have multiple outpatient providers that can follow her anemia closely.  On reevaluation of the patient, I do not think this plan is not unreasonable.  I then had an additional conversation with the patient who agrees with our plan today and she will be discharged with a course of [place] with outpatient follow-up.  She was given strict return precautions including worsening shortness of breath, fatigue, fevers, vomiting or symptoms of which she voiced understanding.  Patient then discharged" CBC with Differential Order: [id number] Status: Final result    Visible to patient: No (inaccessible in MyChart)    Next appt: 03/14/2022 at 10:00 AM in Radiology ([facility])    0 Result  Notes           Component Ref Range & Units 2 d ago (01/04/22) 3 d ago (01/03/22) 1 yr ago (11/20/20) 1 yr ago (11/19/20) 1 yr ago (10/06/20) 2 yr ago (10/17/19) 2 yr ago (10/15/19)  WBC 4.0 - 10.5 K/uL 10.3  14.8 High   18.3 High   19.5 High   9.5  4.2  11.7 High    RBC 3.87 - 5.11 MIL/uL 4.16  4.44  3.97  4.69  4.49  3.73 Low   3.73 Low    Hemoglobin 12.0 - 15.0 g/dL 7.1 Low   7.2 Low  CM  10.9 Low   12.8  12.2  10.9 Low   11.0 Low    Comment: Reticulocyte Hemoglobin testing  may be clinically indicated,  consider ordering this additional  test [id number]   HCT 36.0 - 46.0 % 26.2 Low   28.2 Low   33.8 Low   38.4  37.0  32.4 Low   32.5 Low    MCV 80.0 - 100.0 fL 63.0 Low   63.5 Low   85.1  81.9  82.4  86.9  87.1   MCH 26.0 - 34.0 pg 17.1 Low   16.2 Low   27.5  27.3  27.2  29.2  29.5   MCHC 30.0 - 36.0 g/dL 27.1 Low   25.5 Low   32.2  33.3  33.0  33.6  33.8   RDW 11.5 - 15.5 % 22.7 High   22.6 High   16.7 High   16.4 High   14.9  14.6  15.4  Platelets 150 - 400 K/uL 367  397  242  291  268  155  153   Comment: REPEATED TO VERIFY  nRBC 0.0 - 0.2 % 0.0  0.1  0.0 CM  0.1 CM  0.0  0.0 CM  0.0 CM   Neutrophils Relative % % 80  85    75     Neutro Abs 1.7 - 7.7 K/uL 8.3 High   12.6 High     7.2     Lymphocytes Relative % 9  7    15      Lymphs Abs 0.7 - 4.0 K/uL 0.9  1.0    1.4     Monocytes Relative % 9  7    7      Monocytes Absolute 0.1 - 1.0 K/uL 0.9  1.0    0.6     Eosinophils Relative % 1  0    2     Eosinophils Absolute 0.0 - 0.5 K/uL 0.1  0.1    0.2     Basophils Relative % 0  0    0     Basophils Absolute 0.0 - 0.1 K/uL 0.0  0.0    0.0     WBC Morphology  MORPHOLOGY UNREMARKABLE  MORPHOLOGY UNREMARKABLE        Smear Review  MORPHOLOGY UNREMARKABLE  Normal platelet morphology        Immature Granulocytes % 1  1    1      Abs Immature Granulocytes 0.00 - 0.07 K/uL 0.05  0.09 High  CM    0.09 High  CM     Target Cells  PRESENT         Ovalocytes  PRESENT         Comment: Performed at  [facility], [address], [place], [place] [address]  RBC Morphology   See Note CM        Resulting Agency  [place] [facility] [place] [facility] [place] [facility] [place] [facility] [place] [facility] [place] [facility] [place] [facility]         Specimen Collected: 01/04/22 11:11 Last Resulted: 01/04/22 12:29      Lab Flowsheet    Order Details    View Encounter    Lab and Collection Details    Routing    Result History    View All Conversations on this Encounter      CM=Additional comments      Result Care Coordination   Patient Communication   Add Comments   Not seen Back to Top       Other Results from 01/04/2022  Type and screen [facility] Order: [id number] Status: Final result    Visible to patient: No (inaccessible in MyChart)    Next appt: 03/14/2022 at 10:00 AM in Radiology ([facility])    0 Result Notes    Component 2 d ago  ABO/RH(D) A POS   Antibody Screen NEG   Sample Expiration 01/07/2022,2359  Performed at [facility], [address] [place], [place] [address]   Resulting Agency [place]         Specimen Collected: 01/04/22 14:02 Last Resulted: 01/04/22 15:13      Lab Flowsheet    Order Details    View Encounter    Lab and Collection Details    Routing    Result History    View All Conversations on this Encounter        Result Care Coordination  Patient Communication   Add Comments   Not seen Back to Top         Lactic acid, plasma Order: [id number] Status: Final result    Visible to patient: No (inaccessible in MyChart)    Next appt: 03/14/2022 at 10:00 AM in Radiology ([facility])    0 Result Notes          Component Ref Range & Units 2 d ago (01/04/22) 2 d ago (01/04/22) 3 d ago (01/03/22) 1 yr ago (11/19/20) 2 yr ago (10/14/19) 2 yr ago (10/14/19)  Lactic Acid, Venous 0.5 - 1.9 mmol/L 1.3  1.9 CM  2.9 High Panic  CM  1.2 CM  0.8 CM  0.9 CM   Comment: Performed at [facility], [address], [place], [place] [address]  Resulting Agency  [place]  [facility] [place] [facility] [place] [facility] [place] [facility] [place] [facility]         Specimen Collected: 01/04/22 13:03 Last Resulted: 01/04/22 13:25      Lab Flowsheet    Order Details    View Encounter    Lab and Collection Details    Routing    Result History    View All Conversations on this Encounter      CM=Additional comments      Result Care Coordination   Patient Communication   Add Comments   Not seen Back to Top         Blood Culture (routine x 2) Order: [id number] Status: Preliminary result    Visible to patient: No (not released)    Next appt: 03/14/2022 at 10:00 AM in Radiology ([facility])    Specimen Information: BLOOD  0 Result Notes    Component 2 d ago  Specimen Description BLOOD LEFT ARM   Special Requests BOTTLES DRAWN AEROBIC AND ANAEROBIC Blood Culture adequate volume   Culture NO GROWTH 2 DAYS  Performed at [facility], [address], [place], [place] [address]   Report Status PENDING   Resulting Agency [facility]         Specimen Collected: 01/04/22 11:14 Last Resulted: 01/06/22 08:47      Lab Flowsheet    Order Details    View Encounter    Lab and Collection Details    Routing    Result History    View All Conversations on this Encounter        Result Care Coordination   Patient Communication   Not Released  Not seen Back to Top         Lactic acid, plasma Order: [id number] Status: Final result    Visible to patient: No (inaccessible in [place])    Next appt: 03/14/2022 at 10:00 AM in Radiology ([facility])    0 Result Notes         Component Ref Range & Units 2 d ago (01/04/22) 3 d ago (01/03/22) 1 yr ago (11/19/20) 2 yr ago (10/14/19) 2 yr ago (10/14/19)  Lactic Acid, Venous 0.5 - 1.9 mmol/L 1.9  2.9 High Panic  CM  1.2 CM  0.8 CM  0.9 CM   Comment: Performed at [facility], [address] [place], [place] [address]  [place] [facility] LAB [facility] LAB         Specimen Collected:  01/04/22 11:11 Last Resulted: 01/04/22 12:42      Lab Flowsheet    Order Details  Engineer, civil (consulting) History    View All Conversations on this Encounter      CM=Additional comments      Result Care Coordination   Patient Communication   Add Comments   Not seen Back to Top          Contains abnormal data Comprehensive metabolic panel Order: [id number] Status: Final result    Visible to patient: No (inaccessible in MyChart)    Next appt: 03/14/2022 at 10:00 AM in Radiology ([facility])    0 Result Notes           Component Ref Range & Units 2 d ago (01/04/22) 3 d ago (01/03/22) 3 wk ago (12/14/21) 1 mo ago (12/06/21) 1 yr ago (11/21/20) 1 yr ago (11/20/20) 1 yr ago (11/19/20)  Sodium 135 - 145 mmol/L 135  134 Low     132 Low   137    Potassium 3.5 - 5.1 mmol/L 3.8  4.0    4.4  4.6    Chloride 98 - 111 mmol/L 94 Low   93 Low     91 Low   101    CO2 22 - 32 mmol/L 30  30    35 High   30    Glucose, Bld 70 - 99 mg/dL 310 High   275 High  CM    159 High  CM  160 High  CM    Comment: Glucose reference range applies only to samples taken after fasting for at least 8 hours.  BUN 6 - 20 mg/dL 10  7    12  12     Creatinine, Ser 0.44 - 1.00 mg/dL 0.67  0.62  0.60  0.60  0.62  0.59    Calcium 8.9 - 10.3 mg/dL 8.8 Low   9.1    8.8 Low   8.8 Low     Total Protein 6.5 - 8.1 g/dL 7.6       8.1   Albumin 3.5 - 5.0 g/dL 3.6       3.7   AST 15 - 41 U/L 14 Low        16   ALT 0 - 44 U/L 17       17   Alkaline Phosphatase 38 - 126 U/L 106       119   Total Bilirubin 0.3 - 1.2 mg/dL 0.8       1.2   GFR, Estimated >60 mL/min >60  >60 CM    >60 CM  >60 CM    Comment: (NOTE)  Calculated using the CKD-EPI Creatinine Equation (2021)   Anion gap 5 - 15 11  11  CM    6 CM  6 CM    Comment: Performed at [facility], [address], [place], [place] [address]  Resulting Agency  [place] [facility] [place] [facility] [place] [facility] [place] [facility] [place] [facility] [place]  [facility]         Specimen Collected: 01/04/22 11:11 Last Resulted: 01/04/22 11:43

## 2022-01-09 LAB — CULTURE, BLOOD (ROUTINE X 2)
Culture: NO GROWTH
Culture: NO GROWTH
Special Requests: ADEQUATE

## 2022-01-11 ENCOUNTER — Ambulatory Visit: Payer: Medicaid Other

## 2022-01-11 ENCOUNTER — Inpatient Hospital Stay: Payer: Medicare Other | Attending: Oncology

## 2022-01-11 ENCOUNTER — Encounter: Payer: Self-pay | Admitting: Oncology

## 2022-01-11 ENCOUNTER — Inpatient Hospital Stay (HOSPITAL_BASED_OUTPATIENT_CLINIC_OR_DEPARTMENT_OTHER): Payer: Medicare Other | Admitting: Oncology

## 2022-01-11 ENCOUNTER — Encounter: Payer: Self-pay | Admitting: Internal Medicine

## 2022-01-11 ENCOUNTER — Other Ambulatory Visit: Payer: Self-pay

## 2022-01-11 VITALS — BP 106/58 | HR 99 | Temp 98.4°F | Resp 18 | Ht 62.0 in | Wt 221.0 lb

## 2022-01-11 DIAGNOSIS — R195 Other fecal abnormalities: Secondary | ICD-10-CM

## 2022-01-11 DIAGNOSIS — D649 Anemia, unspecified: Secondary | ICD-10-CM | POA: Diagnosis not present

## 2022-01-11 DIAGNOSIS — R918 Other nonspecific abnormal finding of lung field: Secondary | ICD-10-CM | POA: Insufficient documentation

## 2022-01-11 DIAGNOSIS — D509 Iron deficiency anemia, unspecified: Secondary | ICD-10-CM

## 2022-01-11 DIAGNOSIS — R0602 Shortness of breath: Secondary | ICD-10-CM | POA: Insufficient documentation

## 2022-01-11 DIAGNOSIS — R935 Abnormal findings on diagnostic imaging of other abdominal regions, including retroperitoneum: Secondary | ICD-10-CM

## 2022-01-11 DIAGNOSIS — R1084 Generalized abdominal pain: Secondary | ICD-10-CM

## 2022-01-11 LAB — CBC WITH DIFFERENTIAL/PLATELET
Abs Immature Granulocytes: 0.12 10*3/uL — ABNORMAL HIGH (ref 0.00–0.07)
Basophils Absolute: 0 10*3/uL (ref 0.0–0.1)
Basophils Relative: 0 %
Eosinophils Absolute: 0.2 10*3/uL (ref 0.0–0.5)
Eosinophils Relative: 2 %
HCT: 27.9 % — ABNORMAL LOW (ref 36.0–46.0)
Hemoglobin: 7.3 g/dL — ABNORMAL LOW (ref 12.0–15.0)
Immature Granulocytes: 1 %
Lymphocytes Relative: 13 %
Lymphs Abs: 1.1 10*3/uL (ref 0.7–4.0)
MCH: 17.2 pg — ABNORMAL LOW (ref 26.0–34.0)
MCHC: 26.2 g/dL — ABNORMAL LOW (ref 30.0–36.0)
MCV: 65.6 fL — ABNORMAL LOW (ref 80.0–100.0)
Monocytes Absolute: 0.7 10*3/uL (ref 0.1–1.0)
Monocytes Relative: 8 %
Neutro Abs: 6.3 10*3/uL (ref 1.7–7.7)
Neutrophils Relative %: 76 %
Platelets: 390 10*3/uL (ref 150–400)
RBC: 4.25 MIL/uL (ref 3.87–5.11)
RDW: 24.4 % — ABNORMAL HIGH (ref 11.5–15.5)
Smear Review: ADEQUATE
WBC: 8.4 10*3/uL (ref 4.0–10.5)
nRBC: 0.5 % — ABNORMAL HIGH (ref 0.0–0.2)

## 2022-01-11 LAB — COMPREHENSIVE METABOLIC PANEL
ALT: 17 U/L (ref 0–44)
AST: 16 U/L (ref 15–41)
Albumin: 3.7 g/dL (ref 3.5–5.0)
Alkaline Phosphatase: 97 U/L (ref 38–126)
Anion gap: 8 (ref 5–15)
BUN: 7 mg/dL (ref 6–20)
CO2: 31 mmol/L (ref 22–32)
Calcium: 8.9 mg/dL (ref 8.9–10.3)
Chloride: 97 mmol/L — ABNORMAL LOW (ref 98–111)
Creatinine, Ser: 0.69 mg/dL (ref 0.44–1.00)
GFR, Estimated: >60 mL/min (ref >60)
Glucose, Bld: 251 mg/dL — ABNORMAL HIGH (ref 70–99)
Potassium: 4.6 mmol/L (ref 3.5–5.1)
Sodium: 136 mmol/L (ref 135–145)
Total Bilirubin: 0.5 mg/dL (ref 0.3–1.2)
Total Protein: 7.5 g/dL (ref 6.5–8.1)

## 2022-01-11 LAB — IRON AND TIBC
Iron: 116 ug/dL (ref 28–170)
Saturation Ratios: 23 % (ref 10.4–31.8)
TIBC: 514 ug/dL — ABNORMAL HIGH (ref 250–450)
UIBC: 398 ug/dL

## 2022-01-11 LAB — FERRITIN: Ferritin: 8 ng/mL — ABNORMAL LOW (ref 11–307)

## 2022-01-11 NOTE — Progress Notes (Signed)
[place]  Telephone:([phone]) [phone] Fax:(336) [phone]  ID: Stephanie Ross OB: Oct 28, 1965  MR#: [id number]  GUY#:[id number]  Patient Care Team: [facility] as PCP - [facility] [place], [place], [place] as Oncology Nurse Navigator  CHIEF COMPLAINT: FDG avid left lower lobe lung mass, severe anemia.  INTERVAL HISTORY: Patient returns to clinic today for further evaluation, discussion of Stephanie Ross imaging results, and consideration of transfusion.  She was recently seen in the emergency room and noted to have a hemoglobin of 7.1.  She has chronic weakness and fatigue.  She also has chronic shortness of breath and requires oxygen 24 hours a day.  She has no neurologic complaints.  She denies any recent fevers.  She has a fair appetite, but denies weight loss.  She denies any chest pain or hemoptysis.  She has no nausea, vomiting, constipation, or diarrhea.  She denies any melena or hematochezia.  She has no urinary complaints.  Patient offers no further specific complaints today.  REVIEW OF SYSTEMS:   Review of Systems  Constitutional:  Positive for malaise/fatigue. Negative for fever and weight loss.  Respiratory:  Positive for shortness of breath. Negative for cough and hemoptysis.   Cardiovascular: Negative.  Negative for chest pain and leg swelling.  Gastrointestinal: Negative.  Negative for abdominal pain, blood in stool and melena.  Genitourinary: Negative.  Negative for dysuria.  Musculoskeletal: Negative.  Negative for back pain.  Skin: Negative.  Negative for rash.  Neurological: Negative.  Negative for dizziness, focal weakness and headaches.  Psychiatric/Behavioral: Negative.  The patient is not nervous/anxious.     As per HPI. Otherwise, a complete review of systems is negative.  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    PAST SURGICAL HISTORY: Past Surgical History:  Procedure Laterality Date   BILATERAL CARPAL TUNNEL RELEASE     CERVICAL ABLATION      FAMILY HISTORY: Family History  Problem Relation Age of Onset   Colon polyps Father    Breast cancer Sister    Colon polyps Sister    Breast cancer Paternal Grandmother    Colon cancer Neg Hx     ADVANCED DIRECTIVES (Y/N):  N  HEALTH MAINTENANCE: Social History   Tobacco Use   Smoking status: Former    Packs/day: 0.25    Types: Cigarettes    Quit date: 07/17/2019    Years since quitting: 2.4   Smokeless tobacco: Never  Vaping Use   Vaping Use: Former   Quit date: 06/26/2019  Substance Use Topics   Alcohol use: No   Drug use: Not Currently    Types: Marijuana    Comment: occasionally     Colonoscopy:  PAP:  Bone density:  Lipid panel:  Allergies  Allergen Reactions   Amoxicillin Other (See  Comments)    Has patient had a PCN reaction causing immediate rash, facial/tongue/throat swelling, SOB or lightheadedness with hypotension: Yes Has patient had a PCN reaction causing severe rash involving mucus membranes or skin necrosis: Yes Has patient had a PCN reaction that required hospitalization:yes Has patient had a PCN reaction occurring within the last 10 years: yes If all of the above answers are "NO", then may proceed with Cephalosporin use.    Prednisone Other (See Comments)    Full body rash    Doxycycline Other (See Comments)    Other reaction(s): Other (See Comments) "caused fluid in lungs" "caused  fluid in lungs"    Metoprolol     Other reaction(s): Dizziness   Other Other (See Comments)    Tide detergent - rash    Current Outpatient Medications  Medication Sig Dispense Refill   albuterol (PROVENTIL HFA;VENTOLIN HFA) 108 (90 Base) MCG/ACT inhaler Inhale 2 puffs into the lungs every 4 (four) hours as needed for wheezing or shortness of breath. 1 Inhaler 3   albuterol (PROVENTIL) (2.5 MG/3ML) 0.083% nebulizer solution Take 2.5 mg by nebulization every 6 (six) hours as needed for wheezing or shortness of breath.     aspirin 81 MG chewable tablet Chew 81 mg by mouth daily.     atorvastatin (LIPITOR) 40 MG tablet Take 40 mg by mouth daily.     baclofen (LIORESAL) 10 MG tablet Take 10 mg by mouth 3 (three) times daily as needed for muscle spasms.     budesonide-formoterol (SYMBICORT) 160-4.5 MCG/ACT inhaler Inhale 2 puffs into the lungs 2 (two) times daily.     cefadroxil (DURICEF) 500 MG capsule Take 1 capsule (500 mg total) by mouth 2 (two) times daily for 7 days. 14 capsule 0   cetirizine (ZYRTEC) 10 MG tablet Take 10 mg by mouth daily.     clopidogrel (PLAVIX) 75 MG tablet Take 75 mg by mouth daily.     Coenzyme Q10 (COQ10) 100 MG CAPS Take 100 mg by mouth daily.      DALIRESP 500 MCG TABS tablet Take 500 mcg by mouth daily.     ergocalciferol (VITAMIN D2) 1.25 MG (50000 UT) capsule Take 50,000 Units by mouth every Monday.     esomeprazole (NEXIUM) 20 MG capsule Take 20 mg by mouth daily at 12 noon.     meloxicam (MOBIC) 7.5 MG tablet 1 tablet     montelukast (SINGULAIR) 10 MG tablet Take 10 mg by mouth at bedtime.     tiotropium (SPIRIVA) 18 MCG inhalation capsule Place 1 capsule (18 mcg total) into inhaler and inhale daily. 30 capsule 12   furosemide (LASIX) 20 MG tablet Take 20 mg by mouth every other day. (Patient not taking: Reported on 01/11/2022)     losartan (COZAAR) 25 MG tablet Take 25 mg by mouth daily. (Patient not taking: Reported on 01/11/2022)     nortriptyline  (PAMELOR) 10 MG capsule Take 20 mg by mouth daily. (Patient not taking: Reported on 01/11/2022)     No current facility-administered medications for this visit.    OBJECTIVE: Vitals:   01/11/22 1039  BP: (!) 106/58  Pulse: 99  Resp: 18  Temp: 98.4 F (36.9 C)  SpO2: 99%     Body mass index is 40.42 kg/m.    ECOG FS:2 - Symptomatic, <50% confined to bed  General: Well-developed, well-nourished, no acute distress.  Sitting in a wheelchair. Eyes: Pink conjunctiva, anicteric sclera. HEENT: Normocephalic, moist mucous membranes. Lungs: No audible wheezing  or coughing. Heart: Regular rate and rhythm. Abdomen: Soft, nontender, no obvious distention. Musculoskeletal: No edema, cyanosis, or clubbing. Neuro: Alert, answering all questions appropriately. Cranial nerves grossly intact. Skin: No rashes or petechiae noted. Psych: Normal affect.  LAB RESULTS:  Lab Results  Component Value Date   NA 136 01/11/2022   K 4.6 01/11/2022   CL 97 (L) 01/11/2022   CO2 31 01/11/2022   GLUCOSE 251 (H) 01/11/2022   BUN 7 01/11/2022   CREATININE 0.69 01/11/2022   CALCIUM 8.9 01/11/2022   PROT 7.5 01/11/2022   ALBUMIN 3.7 01/11/2022   AST 16 01/11/2022   ALT 17 01/11/2022   ALKPHOS 97 01/11/2022   BILITOT 0.5 01/11/2022   GFRNONAA >60 01/11/2022   GFRAA >60 10/15/2019    Lab Results  Component Value Date   WBC 8.4 01/11/2022   NEUTROABS 6.3 01/11/2022   HGB 7.3 (L) 01/11/2022   HCT 27.9 (L) 01/11/2022   MCV 65.6 (L) 01/11/2022   PLT 390 01/11/2022     STUDIES: DG Hand Complete Right  Result Date: 01/03/2022 CLINICAL DATA:  dorsal hand STS/redness EXAM: RIGHT HAND - COMPLETE 3+ VIEW COMPARISON:  None Available. FINDINGS: No cortical erosion or destruction. There is no evidence of fracture or dislocation. First carpometacarpal joint moderate degenerative changes. Soft tissues are unremarkable. No retained radiopaque foreign body. IMPRESSION: 1. No radiographic finding of  osteomyelitis. 2.  No acute displaced fracture or dislocation. Electronically Signed   By: Iven Finn M.D.   On: 01/03/2022 16:08   US Venous Img Upper Uni Left (DVT)  Result Date: 12/27/2021 CLINICAL DATA:  Left upper extremity pain and muscle spasms for the past week. Evaluate for DVT. EXAM: LEFT UPPER EXTREMITY VENOUS DOPPLER ULTRASOUND TECHNIQUE: Gray-scale sonography with graded compression, as well as color Doppler and duplex ultrasound were performed to evaluate the upper extremity deep venous system from the level of the subclavian vein and including the jugular, axillary, basilic, radial, ulnar and upper cephalic vein. Spectral Doppler was utilized to evaluate flow at rest and with distal augmentation maneuvers. COMPARISON:  None Available. FINDINGS: Contralateral Subclavian Vein: Respiratory phasicity is normal and symmetric with the symptomatic side. No evidence of thrombus. Normal compressibility. Internal Jugular Vein: No evidence of thrombus. Normal compressibility, respiratory phasicity and response to augmentation. Subclavian Vein: No evidence of thrombus. Normal compressibility, respiratory phasicity and response to augmentation. Axillary Vein: No evidence of thrombus. Normal compressibility, respiratory phasicity and response to augmentation. Cephalic Vein: No evidence of thrombus. Normal compressibility, respiratory phasicity and response to augmentation. Basilic Vein: No evidence of thrombus. Normal compressibility, respiratory phasicity and response to augmentation. Brachial Veins: No evidence of thrombus. Normal compressibility, respiratory phasicity and response to augmentation. Radial Veins: No evidence of thrombus. Normal compressibility, respiratory phasicity and response to augmentation. Ulnar Veins: No evidence of thrombus. Normal compressibility, respiratory phasicity and response to augmentation. Other Findings:  None visualized. IMPRESSION: No evidence of DVT within the left  upper extremity. Electronically Signed   By: Sandi Mariscal M.D.   On: 12/27/2021 12:27   CT CHEST ABDOMEN PELVIS W CONTRAST  Result Date: 12/15/2021 CLINICAL DATA:  Chronic right lower quadrant abdominal pain. History of lung cancer. EXAM: CT CHEST, ABDOMEN, AND PELVIS WITH CONTRAST TECHNIQUE: Multidetector CT imaging of the chest, abdomen and pelvis was performed following the standard protocol during bolus administration of intravenous contrast. RADIATION DOSE REDUCTION: This exam was performed according to the departmental dose-optimization program which includes automated exposure control, adjustment of the mA and/or  kV according to patient size and/or use of iterative reconstruction technique. CONTRAST:  195mL OMNIPAQUE IOHEXOL 300 MG/ML  SOLN COMPARISON:  CT chest 12/06/2021; PET-CT 07/26/2021; CT abdomen and pelvis 10/14/2019; PET-CT 12/08/2020 FINDINGS: CT CHEST FINDINGS Cardiovascular: Heart size is at the upper limits of normal. No pericardial effusion. No thoracic aortic aneurysm atherosclerotic calcifications are seen within the aortic root and aortic arch. There are dense coronary artery calcifications. No large central pulmonary embolism is seen on this nondedicated study. Mediastinum/Nodes: No axillary, mediastinal, or hilar pathologically enlarged lymph nodes by CT criteria. The visualized thyroid is unremarkable. The esophagus follows a normal course of normal caliber. Lungs/Pleura: The central airways are patent. Moderate to high-grade centrilobular emphysematous changes within the upper lungs. No significant change in AP dimension bandlike area of scarring/atelectasis within the superomedial aspect of the left lower lobe adjacent to the descending thoracic aorta (axial series 5 images 76 through 84). Note is made that this region did not demonstrate increased uptake on the most recent 05/26/2021 PET-CT, and appears unchanged on noncontrast CT images from that date. Note is made this is the  region of the hypermetabolic superior segment of the left lower lobe primary lung cancer. Curvilinear scarring within the middle lobe and lingula are similar to prior. The middle lobe curvilinear scarring is overall decreased from 12/08/2020. Musculoskeletal: Mild-to-moderate multilevel degenerative disc space narrowing and endplate osteophytosis throughout the thoracic spine. CT ABDOMEN PELVIS FINDINGS Hepatobiliary: Smooth liver contours. Mildly decreased density is again seen throughout the liver suggesting fatty infiltration. No focal liver lesion is seen. The gallbladder is unremarkable. No intrahepatic or extrahepatic biliary ductal dilatation Pancreas: Unremarkable. No pancreatic ductal dilatation or surrounding inflammatory changes. Spleen: Normal in size without focal abnormality. Adrenals/Urinary Tract: Normal adrenals. The kidneys enhance uniformly are symmetric in size without hydronephrosis. There is a 9 mm low-density lesion within the upper pole of the left kidney that is decreased in size from 12 mm on 10/14/2019 suggesting a benign cyst. No follow-up imaging is recommended. No focal urinary bladder wall thickening. Stomach/Bowel: Note is made there was areas of increased uptake within the sigmoid colon on 05/26/2021 CT within the region of the sigmoid colon. There is relative underdistention in this region, and stool but limited evaluation for mass on this modality. Recommend correlation with the findings of recent colonoscopy, as previously recommended. Oral contrast is seen as distal as the distal transverse colon. The terminal ileum is unremarkable. Normal appendix. No dilated loops of bowel to indicate bowel obstruction. Vascular/Lymphatic: No abdominal aortic aneurysm. There is focal ectasia of the infrarenal abdominal aorta measuring up to 2.3 cm in transverse dimension (coronal series 6, image 105), unchanged from 10/14/2019. Moderate to high-grade atherosclerotic calcifications. No  mesenteric, retroperitoneal, or pelvic pathologically enlarged lymph nodes by CT criteria. Reproductive: The uterus is present.  No gross adnexal abnormality. Other: No free air or free fluid.  No ventral abdominal wall hernia. Musculoskeletal: No aggressive lytic or blastic osseous lesion is seen. Transitional lumbosacral anatomy with right-sided assimilation joint and mild associated degenerative change (coronal series 6, image 128). IMPRESSION: Chest: 1. Unchanged appearance of bandlike area of scarring/atelectasis within the medial left lower lobe adjacent to the aorta corresponding to location of the treated primary lung malignancy. 2. No definite thoracic metastatic disease. Abdomen and pelvis: 1. No CT explanation for the patient's right lower quadrant abdominal pain. Normal appendix. Note is made the appendix was inflamed on prior remote 10/14/2019 CT but now has a thin wall, normal caliber, and  normal mild internal air. 2. Please note there was focal hypermetabolic activity within the sigmoid colon on the most recent PET-CT 05/26/2021. On this prior 05/26/2021 PET-CT there was recommendation for colonoscopy if not recently performed. Recommend correlation with the findings of colonoscopy. Aortic Atherosclerosis (ICD10-I70.0) and Emphysema (ICD10-J43.9). Electronically Signed   By: Yvonne Kendall M.D.   On: 12/15/2021 17:30    ASSESSMENT: FDG avid left lower lobe lung mass.  PLAN:    FDG avid left lower lobe lung mass: Highly suspicious for underlying malignancy.  PET scan results from December 08, 2020 reviewed independently with a 3.5 cm hypermetabolic mass with no mediastinal or hilar adenopathy noted.  Given patient's underlying pulmonary and cardiac disease, she was not a surgical candidate and was unable to undergo EBUS/bronchoscopy for possible biopsy and lymph node sampling secondary to high risk with anesthesia.  Patient proceeded directly to XRT which she completed in approximately December  2022.  CT the scan results from December 15, 2021 reviewed independently and reported as above with no obvious evidence of recurrent or progressive disease.  Repeat CT scan in approximately March 2024.   PET positive sigmoid colon lesion: Given suspicious lesion as well as progressive anemia, concern was for a second primary.  Patient has not undergone colonoscopy and expressed understanding that even if a colon mass were found, she would likely not be able to undergo surgical resection.  She is already stated if it comes to that point, she just wants to "be comfortable".  Monitor.   Anemia: Patient's hemoglobin is 7.3 today.  Return to clinic tomorrow for 2 units packed red blood cells.   Disposition: Return to clinic in 1 month with repeat laboratory work and consideration of additional blood.  CT scan in March 2024.    Patient expressed understanding and was in agreement with this plan. She also understands that She can call clinic at any time with any questions, concerns, or complaints.    Cancer Staging  No matching staging information was found for the patient.  Lloyd Huger, MD   01/11/2022 1:02 PM

## 2022-01-12 ENCOUNTER — Inpatient Hospital Stay: Payer: Medicare Other

## 2022-01-12 ENCOUNTER — Other Ambulatory Visit: Payer: Self-pay | Admitting: Oncology

## 2022-01-12 ENCOUNTER — Other Ambulatory Visit: Payer: Self-pay | Admitting: Nurse Practitioner

## 2022-01-12 ENCOUNTER — Other Ambulatory Visit: Payer: Self-pay

## 2022-01-12 DIAGNOSIS — R918 Other nonspecific abnormal finding of lung field: Secondary | ICD-10-CM | POA: Diagnosis not present

## 2022-01-12 DIAGNOSIS — D509 Iron deficiency anemia, unspecified: Secondary | ICD-10-CM

## 2022-01-12 DIAGNOSIS — D649 Anemia, unspecified: Secondary | ICD-10-CM

## 2022-01-12 LAB — TYPE AND SCREEN
ABO/RH(D): A POS
Antibody Screen: NEGATIVE
Unit division: 0
Unit division: 0

## 2022-01-12 LAB — BPAM RBC
Blood Product Expiration Date: 202311172359
Blood Product Expiration Date: 202311172359
Unit Type and Rh: 6200
Unit Type and Rh: 6200

## 2022-01-12 LAB — PREPARE RBC (CROSSMATCH)

## 2022-01-12 MED ORDER — ACETAMINOPHEN 325 MG PO TABS
650.0000 mg | ORAL_TABLET | Freq: Once | ORAL | Status: AC
  Administered 2022-01-12: 650 mg via ORAL
  Filled 2022-01-12: qty 2

## 2022-01-12 MED ORDER — SODIUM CHLORIDE 0.9% IV SOLUTION
250.0000 mL | Freq: Once | INTRAVENOUS | Status: AC
  Administered 2022-01-12: 250 mL via INTRAVENOUS
  Filled 2022-01-12: qty 250

## 2022-01-12 MED ORDER — DIPHENHYDRAMINE HCL 50 MG/ML IJ SOLN
25.0000 mg | Freq: Once | INTRAMUSCULAR | Status: AC
  Administered 2022-01-12: 25 mg via INTRAVENOUS
  Filled 2022-01-12: qty 1

## 2022-01-13 LAB — BPAM RBC
Blood Product Expiration Date: 202311082359
Blood Product Expiration Date: 202311172359
ISSUE DATE / TIME: 202310191146
ISSUE DATE / TIME: 202310191354
Unit Type and Rh: 6200
Unit Type and Rh: 6200

## 2022-01-13 LAB — TYPE AND SCREEN
ABO/RH(D): A POS
Antibody Screen: NEGATIVE
Unit division: 0
Unit division: 0

## 2022-01-22 ENCOUNTER — Other Ambulatory Visit: Payer: Self-pay

## 2022-01-22 ENCOUNTER — Emergency Department (HOSPITAL_COMMUNITY)
Admission: EM | Admit: 2022-01-22 | Discharge: 2022-01-23 | Disposition: A | Discharge status: Home / Self Care | Payer: Medicare Other | Source: Home / Self Care | Attending: Student | Admitting: Student

## 2022-01-22 ENCOUNTER — Encounter (HOSPITAL_COMMUNITY): Payer: Self-pay | Admitting: Emergency Medicine

## 2022-01-22 DIAGNOSIS — Z7902 Long term (current) use of antithrombotics/antiplatelets: Secondary | ICD-10-CM | POA: Insufficient documentation

## 2022-01-22 DIAGNOSIS — Z87891 Personal history of nicotine dependence: Secondary | ICD-10-CM | POA: Insufficient documentation

## 2022-01-22 DIAGNOSIS — Z7982 Long term (current) use of aspirin: Secondary | ICD-10-CM | POA: Insufficient documentation

## 2022-01-22 DIAGNOSIS — J45909 Unspecified asthma, uncomplicated: Secondary | ICD-10-CM | POA: Insufficient documentation

## 2022-01-22 DIAGNOSIS — L03114 Cellulitis of left upper limb: Secondary | ICD-10-CM | POA: Insufficient documentation

## 2022-01-22 DIAGNOSIS — Z7951 Long term (current) use of inhaled steroids: Secondary | ICD-10-CM | POA: Insufficient documentation

## 2022-01-22 DIAGNOSIS — J441 Chronic obstructive pulmonary disease with (acute) exacerbation: Secondary | ICD-10-CM | POA: Insufficient documentation

## 2022-01-22 DIAGNOSIS — Z79899 Other long term (current) drug therapy: Secondary | ICD-10-CM | POA: Insufficient documentation

## 2022-01-22 DIAGNOSIS — Z85118 Personal history of other malignant neoplasm of bronchus and lung: Secondary | ICD-10-CM | POA: Insufficient documentation

## 2022-01-22 DIAGNOSIS — L0390 Cellulitis, unspecified: Secondary | ICD-10-CM

## 2022-01-22 DIAGNOSIS — I2510 Atherosclerotic heart disease of native coronary artery without angina pectoris: Secondary | ICD-10-CM | POA: Insufficient documentation

## 2022-01-22 DIAGNOSIS — I110 Hypertensive heart disease with heart failure: Secondary | ICD-10-CM | POA: Insufficient documentation

## 2022-01-22 DIAGNOSIS — I509 Heart failure, unspecified: Secondary | ICD-10-CM | POA: Insufficient documentation

## 2022-01-22 LAB — COMPREHENSIVE METABOLIC PANEL
ALT: 21 U/L (ref 0–44)
AST: 17 U/L (ref 15–41)
Albumin: 3.8 g/dL (ref 3.5–5.0)
Alkaline Phosphatase: 83 U/L (ref 38–126)
Anion gap: 10 (ref 5–15)
BUN: 10 mg/dL (ref 6–20)
CO2: 30 mmol/L (ref 22–32)
Calcium: 9 mg/dL (ref 8.9–10.3)
Chloride: 95 mmol/L — ABNORMAL LOW (ref 98–111)
Creatinine, Ser: 0.6 mg/dL (ref 0.44–1.00)
GFR, Estimated: >60 mL/min (ref >60)
Glucose, Bld: 171 mg/dL — ABNORMAL HIGH (ref 70–99)
Potassium: 3.2 mmol/L — ABNORMAL LOW (ref 3.5–5.1)
Sodium: 135 mmol/L (ref 135–145)
Total Bilirubin: 0.7 mg/dL (ref 0.3–1.2)
Total Protein: 7.7 g/dL (ref 6.5–8.1)

## 2022-01-22 LAB — CBC
HCT: 35.7 % — ABNORMAL LOW (ref 36.0–46.0)
Hemoglobin: 10.3 g/dL — ABNORMAL LOW (ref 12.0–15.0)
MCH: 20.9 pg — ABNORMAL LOW (ref 26.0–34.0)
MCHC: 28.9 g/dL — ABNORMAL LOW (ref 30.0–36.0)
MCV: 72.4 fL — ABNORMAL LOW (ref 80.0–100.0)
Platelets: 344 10*3/uL (ref 150–400)
RBC: 4.93 MIL/uL (ref 3.87–5.11)
RDW: 31.2 % — ABNORMAL HIGH (ref 11.5–15.5)
WBC: 7.7 10*3/uL (ref 4.0–10.5)
nRBC: 0 % (ref 0.0–0.2)

## 2022-01-22 LAB — LACTIC ACID, PLASMA
Lactic Acid, Venous: 0.9 mmol/L (ref 0.5–1.9)
Lactic Acid, Venous: 1.2 mmol/L (ref 0.5–1.9)

## 2022-01-22 MED ORDER — DEXTROSE 5 % IV SOLN
1500.0000 mg | Freq: Once | INTRAVENOUS | Status: AC
  Administered 2022-01-23: 1500 mg via INTRAVENOUS
  Filled 2022-01-22: qty 75

## 2022-01-22 MED ORDER — HYDROCODONE-ACETAMINOPHEN 5-325 MG PO TABS
1.0000 | ORAL_TABLET | Freq: Once | ORAL | Status: AC
  Administered 2022-01-22: 1 via ORAL
  Filled 2022-01-22: qty 1

## 2022-01-22 NOTE — Progress Notes (Signed)
Pharmacy Note:  [facility] for Acute Bacterial Skin and Skin Structure Infection (ABSSSI) Patients to [facility] Discharge Stephanie Ross is an 56 y.o. female who presented to [facility] on 01/22/2022 with an Acute Bacterial Skin and Skin Structure Infection  Inclusion criteria - Indication []  Moderately large skin lesion (>=75 cm2 or larger - about the size of a baseball) [x]  Cellulitis  Inclusion Criteria - at least one SIRS criteria present []  WBC > 12,000 or < 4000 []  temp >100.9 or < 96.8 [x]  heart rate >90[]  respiratory rate >20  Patient was evaluated for the following exclusion criteria and no exclusions were found  Hardware involvement, Hypotension / shock, Elevated lactate (>2) without other explanation, gram-negative infection risk factors (bites, water exposure, infection after trauma, infection after skin graft, neutropenia, burns, severe immunocompromise), necrotizing fasciitis possible or confirmed, Known or suspected osteomyelitis or septic arthritis, endocarditis, diabetic foot infection, ischemic ulcers, post-operative wound infection, perirectal infections, need for drainage in the operating room, hand or facial infections, injection drug users with a fever, bacteremia, pregnancy or breastfeeding, allergy to related antibiotics like vancomycin, known liver disease (t.bili >2x ULN or AST/ALT 3x ULN)  Wynona Neat, PharmD, BCPS  01/22/2022, 11:49 PM Clinical Pharmacist

## 2022-01-22 NOTE — ED Notes (Signed)
Patient unable to sign MSE d/t being left handed and pain is in her left hand.

## 2022-01-22 NOTE — ED Triage Notes (Signed)
Patient endorses on cellulitis on left hand that started last night. Patient has obvious redness and swelling to left hand.  Patient thinks that her cat scratched her but isn't sure.

## 2022-01-22 NOTE — ED Provider Notes (Signed)
[facility] EMERGENCY DEPARTMENT Provider Note  CSN: [id number] Arrival date & time: 01/22/22 1610  Chief Complaint(s) Cellulitis  HPI Stephanie Ross is a 56 y.o. female with PMH bipolar disorder, CHF, COPD on 3 L home O2, CAD, PVD who presents emergency department for evaluation of hand cellulitis.  Patient was actually seen on 01/04/2022 for right hand cellulitis and discharged on oral antibiotics with improvement of the symptoms.  Between now and then her cat scratched her on the left hand and she began to develop cellulitis of the left hand.  She was seen by her primary care physician who gave her a shot of Rocephin and started her back on the [facility] but unfortunately the patient's symptoms have not resolved.  She endorses left hand pain but denies fever, chest pain, shortness breath, abdominal pain, nausea, vomiting or other systemic symptoms.   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   Patient Active Problem List   Diagnosis Date Noted   Heme positive stool 06/09/2021   Mass of lower lobe of left lung 11/19/2020   Lobar pneumonia ([place]) 11/19/2020   Sepsis ([place]) 11/19/2020   HLD (hyperlipidemia) 11/19/2020   COPD exacerbation ([place]) 11/19/2020   Stroke ([place]) 11/19/2020   Depression 11/19/2020   Acute on chronic respiratory failure with hypoxia ([place]) 11/19/2020   HTN (hypertension) 11/19/2020   Elevated troponin 11/19/2020   Malignant  neoplasm of left lung [facility])    Pre-op evaluation    Essential hypertension    Aortic valve regurgitation    Acute appendicitis 10/14/2019   CVA (cerebral vascular accident) ([place]) 08/17/2019   Home Medication(s) Prior to Admission medications   Medication Sig Start Date End Date Taking? Authorizing Provider  albuterol (PROVENTIL HFA;VENTOLIN HFA) 108 (90 Base) MCG/ACT inhaler Inhale 2 puffs into the lungs every 4 (four) hours as needed for wheezing or shortness of breath. 05/07/17   Noemi Chapel, MD  albuterol (PROVENTIL) (2.5 MG/3ML) 0.083% nebulizer solution Take 2.5 mg by nebulization every 6 (six) hours as needed for wheezing or shortness of breath.    [provider]  aspirin 81 MG chewable tablet Chew 81 mg by mouth daily.    [provider]  atorvastatin (LIPITOR) 40 MG tablet Take 40 mg by mouth daily. 05/30/21   [provider]  baclofen (LIORESAL) 10 MG tablet Take 10 mg by mouth 3 (three) times daily as needed for muscle spasms. 10/26/20   [provider]  budesonide-formoterol (SYMBICORT) 160-4.5 MCG/ACT inhaler Inhale 2 puffs into the lungs 2 (two) times daily.    [provider]  cetirizine (ZYRTEC) 10 MG tablet Take 10 mg by mouth daily.    [provider]  clopidogrel (PLAVIX) 75 MG tablet Take 75 mg by mouth daily. 09/08/19   [provider]  Coenzyme Q10 (  COQ10) 100 MG CAPS Take 100 mg by mouth daily.     [provider]  DALIRESP 500 MCG TABS tablet Take 500 mcg by mouth daily. 11/15/20   [provider]  ergocalciferol (VITAMIN D2) 1.25 MG (50000 UT) capsule Take 50,000 Units by mouth every Monday.    [provider]  esomeprazole (NEXIUM) 20 MG capsule Take 20 mg by mouth daily at 12 noon.    [provider]  furosemide (LASIX) 20 MG tablet Take 20 mg by mouth every other day. Patient not taking: Reported on 01/11/2022 11/08/20   [provider]  losartan (COZAAR) 25 MG  tablet Take 25 mg by mouth daily. Patient not taking: Reported on 01/11/2022 03/03/21   [provider]  meloxicam (MOBIC) 7.5 MG tablet 1 tablet 04/25/21   [provider]  montelukast (SINGULAIR) 10 MG tablet Take 10 mg by mouth at bedtime. 08/02/19   [provider]  nortriptyline (PAMELOR) 10 MG capsule Take 20 mg by mouth daily. Patient not taking: Reported on 01/11/2022 10/06/19   [provider]  tiotropium (SPIRIVA) 18 MCG inhalation capsule Place 1 capsule (18 mcg total) into inhaler and inhale daily. 10/06/20   Fisher, Linden Dolin, PA-C                                                                                                                                    Past Surgical History Past Surgical History:  Procedure Laterality Date   BILATERAL CARPAL TUNNEL RELEASE     CERVICAL ABLATION     Family History Family History  Problem Relation Age of Onset   Colon polyps Father    Breast cancer Sister    Colon polyps Sister    Breast cancer Paternal Grandmother    Colon cancer Neg Hx     Social History Social History   Tobacco Use   Smoking status: Former    Packs/day: 0.25    Types: Cigarettes    Quit date: 07/17/2019    Years since quitting: 2.5   Smokeless tobacco: Never  Vaping Use   Vaping Use: Former   Quit date: 06/26/2019  Substance Use Topics   Alcohol use: No   Drug use: Not Currently    Types: Marijuana    Comment: occasionally   Allergies Amoxicillin, Prednisone, Doxycycline, Metoprolol, and Other  Review of Systems Review of Systems  Skin:  Positive for rash.    Physical Exam Vital Signs  I have reviewed the triage vital signs BP 133/62   Pulse 92   Temp 98.2 F (36.8 C) (Oral)   Resp 18   Ht 5\' 2"  (1.575 m)   Wt 98.3 kg   SpO2 94%   BMI 39.65 kg/m   Physical Exam Vitals and nursing note reviewed.  Constitutional:      General: She is not in acute distress.    Appearance: She is well-developed.  HENT:  Head: Normocephalic and atraumatic.  Eyes:     Conjunctiva/sclera: Conjunctivae normal.  Cardiovascular:     Rate and Rhythm: Normal rate and regular rhythm.     Heart sounds: No murmur heard. Pulmonary:     Effort: Pulmonary effort is normal. No respiratory distress.     Breath sounds: Normal breath sounds.  Abdominal:     Palpations: Abdomen is soft.     Tenderness: There is no abdominal tenderness.  Musculoskeletal:        General: No swelling.     Cervical back: Neck supple.  Skin:    General: Skin is warm and dry.     Capillary Refill: Capillary refill takes less than 2 seconds.     Findings: Erythema and rash present.  Neurological:     Mental Status: She is alert.  Psychiatric:        Mood and Affect: Mood normal.     ED Results and Treatments Labs (all labs ordered are listed, but only abnormal results are displayed) Labs Reviewed  CBC - Abnormal; Notable for the following components:      Result Value   Hemoglobin 10.3 (*)    HCT 35.7 (*)    MCV 72.4 (*)    MCH 20.9 (*)    MCHC 28.9 (*)    RDW 31.2 (*)    All other components within normal limits  COMPREHENSIVE METABOLIC PANEL - Abnormal; Notable for the following components:   Potassium 3.2 (*)    Chloride 95 (*)    Glucose, Bld 171 (*)    All other components within normal limits  CULTURE, BLOOD (SINGLE)  LACTIC ACID, PLASMA  LACTIC ACID, PLASMA                                                                                                                          Radiology No results found.  Pertinent labs & imaging results that were available during my care of the patient were reviewed by me and considered in my medical decision making (see MDM for details).  Medications Ordered in ED Medications  HYDROcodone-acetaminophen (NORCO/VICODIN) 5-325 MG per tablet 1 tablet (1 tablet Oral Given 01/22/22 2333)                                                                                                                                      Procedures Procedures  (  including critical care time)  Medical Decision Making / ED Course   This patient presents to the ED for concern of hand pain and swelling, this involves an extensive number of treatment options, and is a complaint that carries with it a high risk of complications and morbidity.  The differential diagnosis includes cellulitis, fracture, ligamentous injury, retained foreign body  MDM: Seen emergency room for evaluation of left hand pain, erythema and swelling.  Physical exam with swelling and erythema of the dorsum of the hand extending to the mid forearm that is tender to palpation but no obvious puncture wound or traumatic injury seen externally.  Laboratory evaluation with some mild hypokalemia to 3.2, hemoglobin 10.3 with MCV of 72.4 but is otherwise unremarkable.  Given that patient has already failed outpatient Rocephin and Duricef, I gave the patient options including IV antibiotics and admission to the hospital or single dose dalbavancin with outpatient follow-up and return precautions if symptoms not improved in 72 hours.  Patient would like to trial dalbavancin at this time and that she received this medication and was discharged with pain control.  I have very low suspicion for fracture given no traumatic injury to the area and thus x-ray imaging deferred.   Additional history obtained: -Additional history obtained from husband -External records from outside source obtained and reviewed including: Chart review including previous notes, labs, imaging, consultation notes   Lab Tests: -I ordered, reviewed, and interpreted labs.   The pertinent results include:   Labs Reviewed  CBC - Abnormal; Notable for the following components:      Result Value   Hemoglobin 10.3 (*)    HCT 35.7 (*)    MCV 72.4 (*)    MCH 20.9 (*)    MCHC 28.9 (*)    RDW 31.2 (*)    All other components within normal limits  COMPREHENSIVE  METABOLIC PANEL - Abnormal; Notable for the following components:   Potassium 3.2 (*)    Chloride 95 (*)    Glucose, Bld 171 (*)    All other components within normal limits  CULTURE, BLOOD (SINGLE)  LACTIC ACID, PLASMA  LACTIC ACID, PLASMA       Medicines ordered and prescription drug management: Meds ordered this encounter  Medications   HYDROcodone-acetaminophen (NORCO/VICODIN) 5-325 MG per tablet 1 tablet    -I have reviewed the patients home medicines and have made adjustments as needed  Critical interventions none   Cardiac Monitoring: The patient was maintained on a cardiac monitor.  I personally viewed and interpreted the cardiac monitored which showed an underlying rhythm of: NSR  Social Determinants of Health:  Factors impacting patients care include: none   Reevaluation: After the interventions noted above, I reevaluated the patient and found that they have :improved  Co morbidities that complicate the patient evaluation  Past Medical History:  Diagnosis Date   ADHD (attention deficit hyperactivity disorder)    Anxiety    Arthritis    Asthma    Bipolar disorder ([place])    Cancer ([place])    left lung cancer   CHF (congestive heart failure) (HCC)    COPD (chronic obstructive pulmonary disease) (HCC)    Coronary artery disease    Depression    Dyspnea    GERD (gastroesophageal reflux disease)    Hypertension    On home oxygen therapy    3L O2 via [place] continuously   Peripheral vascular disease ([place])    Pneumonia    Stroke ([place])  x 2 with left sided sensation deficits      Dispostion: I considered admission for this patient, but at this time we use shared decision making to trial dalbavancin with outpatient follow-up and return precautions instead of admission for IV antibiotics.     Final Clinical Impression(s) / ED Diagnoses Final diagnoses:  None     @PCDICTATION @    Latroy Gaymon, Debe Coder, MD 01/23/22 0110

## 2022-01-23 ENCOUNTER — Other Ambulatory Visit: Payer: Self-pay

## 2022-01-23 ENCOUNTER — Inpatient Hospital Stay (HOSPITAL_COMMUNITY)
Admission: EM | Admit: 2022-01-23 | Discharge: 2022-01-25 | DRG: 603 | Disposition: A | Discharge status: Home / Self Care | Payer: Medicare Other | Attending: Internal Medicine | Admitting: Internal Medicine

## 2022-01-23 ENCOUNTER — Emergency Department (HOSPITAL_COMMUNITY): Payer: Medicare Other

## 2022-01-23 ENCOUNTER — Encounter (HOSPITAL_COMMUNITY): Payer: Self-pay

## 2022-01-23 DIAGNOSIS — Z888 Allergy status to other drugs, medicaments and biological substances status: Secondary | ICD-10-CM | POA: Diagnosis not present

## 2022-01-23 DIAGNOSIS — Z6841 Body Mass Index (BMI) 40.0 and over, adult: Secondary | ICD-10-CM | POA: Diagnosis not present

## 2022-01-23 DIAGNOSIS — E876 Hypokalemia: Secondary | ICD-10-CM | POA: Diagnosis present

## 2022-01-23 DIAGNOSIS — Z803 Family history of malignant neoplasm of breast: Secondary | ICD-10-CM

## 2022-01-23 DIAGNOSIS — I2510 Atherosclerotic heart disease of native coronary artery without angina pectoris: Secondary | ICD-10-CM | POA: Diagnosis present

## 2022-01-23 DIAGNOSIS — Z8673 Personal history of transient ischemic attack (TIA), and cerebral infarction without residual deficits: Secondary | ICD-10-CM

## 2022-01-23 DIAGNOSIS — C3492 Malignant neoplasm of unspecified part of left bronchus or lung: Secondary | ICD-10-CM | POA: Diagnosis present

## 2022-01-23 DIAGNOSIS — W5503XA Scratched by cat, initial encounter: Secondary | ICD-10-CM

## 2022-01-23 DIAGNOSIS — Z9981 Dependence on supplemental oxygen: Secondary | ICD-10-CM

## 2022-01-23 DIAGNOSIS — Z91048 Other nonmedicinal substance allergy status: Secondary | ICD-10-CM | POA: Diagnosis not present

## 2022-01-23 DIAGNOSIS — Z85118 Personal history of other malignant neoplasm of bronchus and lung: Secondary | ICD-10-CM | POA: Diagnosis not present

## 2022-01-23 DIAGNOSIS — Z7902 Long term (current) use of antithrombotics/antiplatelets: Secondary | ICD-10-CM | POA: Diagnosis not present

## 2022-01-23 DIAGNOSIS — F319 Bipolar disorder, unspecified: Secondary | ICD-10-CM | POA: Diagnosis present

## 2022-01-23 DIAGNOSIS — I10 Essential (primary) hypertension: Secondary | ICD-10-CM | POA: Diagnosis present

## 2022-01-23 DIAGNOSIS — Z83719 Family history of colon polyps, unspecified: Secondary | ICD-10-CM | POA: Diagnosis not present

## 2022-01-23 DIAGNOSIS — Z79899 Other long term (current) drug therapy: Secondary | ICD-10-CM

## 2022-01-23 DIAGNOSIS — I739 Peripheral vascular disease, unspecified: Secondary | ICD-10-CM | POA: Diagnosis present

## 2022-01-23 DIAGNOSIS — J9611 Chronic respiratory failure with hypoxia: Secondary | ICD-10-CM | POA: Diagnosis present

## 2022-01-23 DIAGNOSIS — I639 Cerebral infarction, unspecified: Secondary | ICD-10-CM | POA: Diagnosis not present

## 2022-01-23 DIAGNOSIS — Z87891 Personal history of nicotine dependence: Secondary | ICD-10-CM | POA: Diagnosis not present

## 2022-01-23 DIAGNOSIS — Z7982 Long term (current) use of aspirin: Secondary | ICD-10-CM | POA: Diagnosis not present

## 2022-01-23 DIAGNOSIS — F419 Anxiety disorder, unspecified: Secondary | ICD-10-CM | POA: Diagnosis present

## 2022-01-23 DIAGNOSIS — Z881 Allergy status to other antibiotic agents status: Secondary | ICD-10-CM

## 2022-01-23 DIAGNOSIS — F909 Attention-deficit hyperactivity disorder, unspecified type: Secondary | ICD-10-CM | POA: Diagnosis present

## 2022-01-23 DIAGNOSIS — Z9221 Personal history of antineoplastic chemotherapy: Secondary | ICD-10-CM | POA: Diagnosis not present

## 2022-01-23 DIAGNOSIS — L03114 Cellulitis of left upper limb: Secondary | ICD-10-CM | POA: Diagnosis present

## 2022-01-23 LAB — CBC WITH DIFFERENTIAL/PLATELET
Abs Immature Granulocytes: 0.03 10*3/uL (ref 0.00–0.07)
Basophils Absolute: 0 10*3/uL (ref 0.0–0.1)
Basophils Relative: 0 %
Eosinophils Absolute: 0.1 10*3/uL (ref 0.0–0.5)
Eosinophils Relative: 1 %
HCT: 34.6 % — ABNORMAL LOW (ref 36.0–46.0)
Hemoglobin: 10 g/dL — ABNORMAL LOW (ref 12.0–15.0)
Immature Granulocytes: 0 %
Lymphocytes Relative: 13 %
Lymphs Abs: 1 10*3/uL (ref 0.7–4.0)
MCH: 20.9 pg — ABNORMAL LOW (ref 26.0–34.0)
MCHC: 28.9 g/dL — ABNORMAL LOW (ref 30.0–36.0)
MCV: 72.4 fL — ABNORMAL LOW (ref 80.0–100.0)
Monocytes Absolute: 0.7 10*3/uL (ref 0.1–1.0)
Monocytes Relative: 9 %
Neutro Abs: 5.9 10*3/uL (ref 1.7–7.7)
Neutrophils Relative %: 77 %
Platelets: 335 10*3/uL (ref 150–400)
RBC: 4.78 MIL/uL (ref 3.87–5.11)
RDW: 30.7 % — ABNORMAL HIGH (ref 11.5–15.5)
WBC: 7.7 10*3/uL (ref 4.0–10.5)
nRBC: 0 % (ref 0.0–0.2)

## 2022-01-23 LAB — COMPREHENSIVE METABOLIC PANEL
ALT: 23 U/L (ref 0–44)
AST: 19 U/L (ref 15–41)
Albumin: 3.6 g/dL (ref 3.5–5.0)
Alkaline Phosphatase: 79 U/L (ref 38–126)
Anion gap: 10 (ref 5–15)
BUN: 11 mg/dL (ref 6–20)
CO2: 29 mmol/L (ref 22–32)
Calcium: 9.3 mg/dL (ref 8.9–10.3)
Chloride: 95 mmol/L — ABNORMAL LOW (ref 98–111)
Creatinine, Ser: 0.61 mg/dL (ref 0.44–1.00)
GFR, Estimated: >60 mL/min (ref >60)
Glucose, Bld: 173 mg/dL — ABNORMAL HIGH (ref 70–99)
Potassium: 3.7 mmol/L (ref 3.5–5.1)
Sodium: 134 mmol/L — ABNORMAL LOW (ref 135–145)
Total Bilirubin: 0.6 mg/dL (ref 0.3–1.2)
Total Protein: 7.6 g/dL (ref 6.5–8.1)

## 2022-01-23 LAB — C-REACTIVE PROTEIN: CRP: 7.4 mg/dL — ABNORMAL HIGH (ref <1.0)

## 2022-01-23 LAB — URINALYSIS, ROUTINE W REFLEX MICROSCOPIC
Bilirubin Urine: NEGATIVE
Glucose, UA: NEGATIVE mg/dL
Hgb urine dipstick: NEGATIVE
Ketones, ur: NEGATIVE mg/dL
Leukocytes,Ua: NEGATIVE
Nitrite: NEGATIVE
Protein, ur: NEGATIVE mg/dL
Specific Gravity, Urine: 1.008 (ref 1.005–1.030)
pH: 5 (ref 5.0–8.0)

## 2022-01-23 LAB — POC URINE PREG, ED: Preg Test, Ur: NEGATIVE

## 2022-01-23 LAB — SEDIMENTATION RATE: Sed Rate: 47 mm/hr — ABNORMAL HIGH (ref 0–22)

## 2022-01-23 LAB — LACTIC ACID, PLASMA
Lactic Acid, Venous: 1.3 mmol/L (ref 0.5–1.9)
Lactic Acid, Venous: 0.7 mmol/L (ref 0.5–1.9)

## 2022-01-23 LAB — PROTIME-INR
INR: 1 (ref 0.8–1.2)
Prothrombin Time: 12.7 seconds (ref 11.4–15.2)

## 2022-01-23 MED ORDER — MONTELUKAST SODIUM 10 MG PO TABS
10.0000 mg | ORAL_TABLET | Freq: Every day | ORAL | Status: DC
  Administered 2022-01-23 – 2022-01-24 (×2): 10 mg via ORAL
  Filled 2022-01-23 (×2): qty 1

## 2022-01-23 MED ORDER — CEFAZOLIN SODIUM-DEXTROSE 1-4 GM/50ML-% IV SOLN
1.0000 g | Freq: Once | INTRAVENOUS | Status: AC
  Administered 2022-01-23: 1 g via INTRAVENOUS
  Filled 2022-01-23 (×2): qty 50

## 2022-01-23 MED ORDER — UMECLIDINIUM BROMIDE 62.5 MCG/ACT IN AEPB
1.0000 | INHALATION_SPRAY | Freq: Every day | RESPIRATORY_TRACT | Status: DC
  Administered 2022-01-24: 1 via RESPIRATORY_TRACT
  Filled 2022-01-23: qty 7

## 2022-01-23 MED ORDER — VANCOMYCIN HCL 2000 MG/400ML IV SOLN
2000.0000 mg | Freq: Once | INTRAVENOUS | Status: DC
  Filled 2022-01-23: qty 400

## 2022-01-23 MED ORDER — TIOTROPIUM BROMIDE MONOHYDRATE 18 MCG IN CAPS: 18.0000 ug | ORAL_CAPSULE | Freq: Every day | RESPIRATORY_TRACT | Status: DC

## 2022-01-23 MED ORDER — CEFAZOLIN SODIUM 1 G IJ SOLR: 1.0000 g | Freq: Four times a day (QID) | INTRAMUSCULAR | Status: DC

## 2022-01-23 MED ORDER — ACETAMINOPHEN 650 MG RE SUPP: 650.0000 mg | Freq: Four times a day (QID) | RECTAL | Status: DC | PRN

## 2022-01-23 MED ORDER — ALBUTEROL SULFATE (2.5 MG/3ML) 0.083% IN NEBU
2.5000 mg | INHALATION_SOLUTION | Freq: Four times a day (QID) | RESPIRATORY_TRACT | Status: DC | PRN
  Administered 2022-01-24: 2.5 mg via RESPIRATORY_TRACT
  Filled 2022-01-23: qty 3

## 2022-01-23 MED ORDER — ENOXAPARIN SODIUM 40 MG/0.4ML IJ SOSY
40.0000 mg | PREFILLED_SYRINGE | INTRAMUSCULAR | Status: DC
  Administered 2022-01-24 – 2022-01-25 (×2): 40 mg via SUBCUTANEOUS
  Filled 2022-01-23 (×2): qty 0.4

## 2022-01-23 MED ORDER — MORPHINE SULFATE (PF) 2 MG/ML IV SOLN
2.0000 mg | Freq: Once | INTRAVENOUS | Status: AC
  Administered 2022-01-23: 2 mg via INTRAVENOUS
  Filled 2022-01-23: qty 1

## 2022-01-23 MED ORDER — ONDANSETRON HCL 4 MG/2ML IJ SOLN: 4.0000 mg | Freq: Four times a day (QID) | INTRAMUSCULAR | Status: DC | PRN

## 2022-01-23 MED ORDER — OXYCODONE-ACETAMINOPHEN 5-325 MG PO TABS
1.0000 | ORAL_TABLET | Freq: Four times a day (QID) | ORAL | Status: DC | PRN
  Administered 2022-01-24 – 2022-01-25 (×4): 1 via ORAL
  Filled 2022-01-23 (×4): qty 1

## 2022-01-23 MED ORDER — HYDROCODONE-ACETAMINOPHEN 5-325 MG PO TABS: 2.0000 | ORAL_TABLET | ORAL | 0 refills | Status: DC | PRN

## 2022-01-23 MED ORDER — ONDANSETRON HCL 4 MG PO TABS: 4.0000 mg | ORAL_TABLET | Freq: Four times a day (QID) | ORAL | Status: DC | PRN

## 2022-01-23 MED ORDER — ROFLUMILAST 500 MCG PO TABS
500.0000 ug | ORAL_TABLET | Freq: Every day | ORAL | Status: DC
  Administered 2022-01-24 – 2022-01-25 (×2): 500 ug via ORAL
  Filled 2022-01-23 (×2): qty 1

## 2022-01-23 MED ORDER — OXYCODONE-ACETAMINOPHEN 5-325 MG PO TABS
1.0000 | ORAL_TABLET | Freq: Once | ORAL | Status: AC
  Administered 2022-01-23: 1 via ORAL
  Filled 2022-01-23: qty 1

## 2022-01-23 MED ORDER — SODIUM CHLORIDE 0.9 % IV SOLN
2.0000 g | INTRAVENOUS | Status: DC
  Administered 2022-01-23: 2 g via INTRAVENOUS
  Filled 2022-01-23 (×2): qty 20

## 2022-01-23 MED ORDER — ASPIRIN 81 MG PO CHEW
81.0000 mg | CHEWABLE_TABLET | Freq: Every day | ORAL | Status: DC
  Administered 2022-01-24 – 2022-01-25 (×2): 81 mg via ORAL
  Filled 2022-01-23 (×2): qty 1

## 2022-01-23 MED ORDER — ATORVASTATIN CALCIUM 40 MG PO TABS
40.0000 mg | ORAL_TABLET | Freq: Every day | ORAL | Status: DC
  Administered 2022-01-24 – 2022-01-25 (×2): 40 mg via ORAL
  Filled 2022-01-23 (×3): qty 1

## 2022-01-23 MED ORDER — CLOPIDOGREL BISULFATE 75 MG PO TABS
75.0000 mg | ORAL_TABLET | Freq: Every day | ORAL | Status: DC
  Administered 2022-01-24 – 2022-01-25 (×2): 75 mg via ORAL
  Filled 2022-01-23 (×2): qty 1

## 2022-01-23 MED ORDER — VANCOMYCIN HCL 750 MG/150ML IV SOLN: 750.0000 mg | Freq: Two times a day (BID) | INTRAVENOUS | Status: DC

## 2022-01-23 MED ORDER — LACTATED RINGERS IV BOLUS (SEPSIS)
1000.0000 mL | Freq: Once | INTRAVENOUS | Status: AC
  Administered 2022-01-23: 1000 mL via INTRAVENOUS

## 2022-01-23 MED ORDER — MOMETASONE FURO-FORMOTEROL FUM 200-5 MCG/ACT IN AERO
2.0000 | INHALATION_SPRAY | Freq: Two times a day (BID) | RESPIRATORY_TRACT | Status: DC
  Administered 2022-01-24 – 2022-01-25 (×3): 2 via RESPIRATORY_TRACT
  Filled 2022-01-23: qty 8.8

## 2022-01-23 MED ORDER — POLYETHYLENE GLYCOL 3350 17 G PO PACK: 17.0000 g | PACK | Freq: Every day | ORAL | Status: DC | PRN

## 2022-01-23 MED ORDER — ACETAMINOPHEN 325 MG PO TABS: 650.0000 mg | ORAL_TABLET | Freq: Four times a day (QID) | ORAL | Status: DC | PRN

## 2022-01-23 MED ORDER — PANTOPRAZOLE SODIUM 40 MG PO TBEC
40.0000 mg | DELAYED_RELEASE_TABLET | Freq: Every day | ORAL | Status: DC
  Administered 2022-01-24 – 2022-01-25 (×2): 40 mg via ORAL
  Filled 2022-01-23 (×2): qty 1

## 2022-01-23 NOTE — Assessment & Plan Note (Addendum)
Stable

## 2022-01-23 NOTE — ED Provider Notes (Signed)
[facility] EMERGENCY DEPARTMENT Provider Note   CSN: [id number] Arrival date & time: 01/23/22  1217     History  Chief Complaint  Patient presents with   Hand Pain    Stephanie Ross is a 56 y.o. female.   Hand Pain  Patient presents for redness, pain, and swelling to area of left hand and wrist.  She was seen in the ED initially on 10/11 for right hand cellulitis.  She was discharged on oral antibiotics.  Since that time, she had a scratch from her cat on her left hand.  She developed recurrence of cellulitis.  She went to her PCPs and got a shot of Rocephin and was started on Duricef.  She presented to the ED again yesterday due to persistent symptoms.  She was given Dalvance.  Since her ED visit yesterday, she reports that swelling and redness seem to improve but then worsened again.  She has worsened pain in the area of her swelling and erythema.  She has not had any systemic symptoms.     Home Medications Prior to Admission medications   Medication Sig Start Date End Date Taking? Authorizing Provider  albuterol (PROVENTIL HFA;VENTOLIN HFA) 108 (90 Base) MCG/ACT inhaler Inhale 2 puffs into the lungs every 4 (four) hours as needed for wheezing or shortness of breath. 05/07/17   Noemi Chapel, MD  albuterol (PROVENTIL) (2.5 MG/3ML) 0.083% nebulizer solution Take 2.5 mg by nebulization every 6 (six) hours as needed for wheezing or shortness of breath.    [provider]  aspirin 81 MG chewable tablet Chew 81 mg by mouth daily.    [provider]  atorvastatin (LIPITOR) 40 MG tablet Take 40 mg by mouth daily. 05/30/21   [provider]  baclofen (LIORESAL) 10 MG tablet Take 10 mg by mouth 3 (three) times daily as needed for muscle spasms. 10/26/20   [provider]  budesonide-formoterol (SYMBICORT) 160-4.5 MCG/ACT inhaler Inhale 2 puffs into the lungs 2 (two) times daily.    [provider]  cetirizine (ZYRTEC) 10 MG tablet Take 10 mg by  mouth daily.    [provider]  clopidogrel (PLAVIX) 75 MG tablet Take 75 mg by mouth daily. 09/08/19   [provider]  Coenzyme Q10 (COQ10) 100 MG CAPS Take 100 mg by mouth daily.     [provider]  DALIRESP 500 MCG TABS tablet Take 500 mcg by mouth daily. 11/15/20   [provider]  ergocalciferol (VITAMIN D2) 1.25 MG (50000 UT) capsule Take 50,000 Units by mouth every Monday.    [provider]  esomeprazole (NEXIUM) 20 MG capsule Take 20 mg by mouth daily at 12 noon.    [provider]  furosemide (LASIX) 20 MG tablet Take 20 mg by mouth every other day. Patient not taking: Reported on 01/11/2022 11/08/20   [provider]  HYDROcodone-acetaminophen (NORCO/VICODIN) 5-325 MG tablet Take 2 tablets by mouth every 4 (four) hours as needed. 01/23/22   Kommor, [place], MD  losartan (COZAAR) 25 MG tablet Take 25 mg by mouth daily. Patient not taking: Reported on 01/11/2022 03/03/21   [provider]  meloxicam (MOBIC) 7.5 MG tablet 1 tablet 04/25/21   [provider]  montelukast (SINGULAIR) 10 MG tablet Take 10 mg by mouth at bedtime. 08/02/19   [provider]  nortriptyline (PAMELOR) 10 MG capsule Take 20 mg by mouth daily. Patient not taking: Reported on 01/11/2022 10/06/19   [provider]  tiotropium [facility])  18 MCG inhalation capsule Place 1 capsule (18 mcg total) into inhaler and inhale daily. 10/06/20   Caryn Section Linden Dolin, PA-C      Allergies    Amoxicillin, Prednisone, Doxycycline, Metoprolol, and Other    Review of Systems   Review of Systems  Musculoskeletal:  Positive for arthralgias.  Skin:  Positive for color change.  All other systems reviewed and are negative.   Physical Exam Updated Vital Signs BP 114/72   Pulse 80   Temp 97.9 F (36.6 C) (Oral)   Resp 18   Ht 5\' 2"  (1.575 m)   Wt 97.4 kg   SpO2 98%   BMI 39.29 kg/m  Physical Exam Vitals and nursing note reviewed.   Constitutional:      General: She is not in acute distress.    Appearance: Normal appearance. She is well-developed. She is not ill-appearing, toxic-appearing or diaphoretic.  HENT:     Head: Normocephalic and atraumatic.     Right Ear: External ear normal.     Left Ear: External ear normal.     Nose: Nose normal.     Mouth/Throat:     Mouth: Mucous membranes are moist.     Pharynx: Oropharynx is clear.  Eyes:     Extraocular Movements: Extraocular movements intact.     Conjunctiva/sclera: Conjunctivae normal.  Cardiovascular:     Rate and Rhythm: Normal rate and regular rhythm.  Pulmonary:     Effort: Pulmonary effort is normal. No respiratory distress.  Abdominal:     General: There is no distension.     Palpations: Abdomen is soft.     Tenderness: There is no abdominal tenderness.  Musculoskeletal:        General: Swelling and tenderness present. No deformity. Normal range of motion.     Cervical back: Normal range of motion and neck supple.  Skin:    General: Skin is warm and dry.     Capillary Refill: Capillary refill takes less than 2 seconds.     Findings: Erythema present.  Neurological:     General: No focal deficit present.     Mental Status: She is alert and oriented to person, place, and time.     Cranial Nerves: No cranial nerve deficit.     Sensory: No sensory deficit.     Motor: No weakness.     Coordination: Coordination normal.  Psychiatric:        Mood and Affect: Mood normal.        Behavior: Behavior normal.        Thought Content: Thought content normal.        Judgment: Judgment normal.      ED Results / Procedures / Treatments   Labs (all labs ordered are listed, but only abnormal results are displayed) Labs Reviewed  COMPREHENSIVE METABOLIC PANEL - Abnormal; Notable for the following components:      Result Value   Sodium 134 (*)    Chloride 95 (*)    Glucose, Bld 173 (*)    All other components within normal limits  CBC WITH  DIFFERENTIAL/PLATELET - Abnormal; Notable for the following components:   Hemoglobin 10.0 (*)    HCT 34.6 (*)    MCV 72.4 (*)    MCH 20.9 (*)    MCHC 28.9 (*)    RDW 30.7 (*)    All other components within normal limits  CULTURE, BLOOD (ROUTINE X 2)  CULTURE, BLOOD (ROUTINE X 2)  LACTIC ACID, PLASMA  LACTIC ACID, PLASMA  PROTIME-INR  URINALYSIS, ROUTINE W REFLEX MICROSCOPIC  SEDIMENTATION RATE  C-REACTIVE PROTEIN  POC URINE PREG, ED    EKG None  Radiology DG Hand 2 View Left  Result Date: 01/23/2022 CLINICAL DATA:  Cellulitis to left hand with swelling redness and pain. EXAM: LEFT HAND - 2 VIEW COMPARISON:  none available FINDINGS: AP and lateral views demonstrate degenerative changes of the base of the thumb. No acute fracture or dislocation. No osseous destruction. No soft tissue gas. There may be dorsal soft tissue swelling about the carpal and metacarpal bones on the lateral view. No radiopaque foreign object. IMPRESSION: No acute osseous abnormality. Electronically Signed   By: Abigail Miyamoto M.D.   On: 01/23/2022 17:51    Procedures Procedures    Medications Ordered in ED Medications  lactated ringers bolus 1,000 mL (has no administration in time range)  ceFAZolin (ANCEF) IVPB 1 g/50 mL premix (has no administration in time range)  oxyCODONE-acetaminophen (PERCOCET/ROXICET) 5-325 MG per tablet 1 tablet (1 tablet Oral Given 01/23/22 1715)    ED Course/ Medical Decision Making/ A&P                           Medical Decision Making Amount and/or Complexity of Data Reviewed Labs: ordered. Radiology: ordered. ECG/medicine tests: ordered.  Risk Prescription drug management. Decision regarding hospitalization.   This patient presents to the ED for concern of skin swelling, pain, and redness, this involves an extensive number of treatment options, and is a complaint that carries with it a high risk of complications and morbidity.  The differential diagnosis includes  cellulitis, allergic reaction, vasculitis, fungal infection   Co morbidities that complicate the patient evaluation  CVA, COPD, CHF, anxiety, arthritis, GERD, HTN, PVD   Additional history obtained:  Additional history obtained from N/A External records from outside source obtained and reviewed including EMR   Lab Tests:  I Ordered, and personally interpreted labs.  The pertinent results include: Anemia is improved from baseline, no leukocytosis is present.  Kidney function and electrolytes are normal.   Imaging Studies ordered:  I ordered imaging studies including x-ray of her left hand I independently visualized and interpreted imaging which showed no foreign bodies, no osseous involvement I agree with the radiologist interpretation   Cardiac Monitoring: / EKG:  The patient was maintained on a cardiac monitor.  I personally viewed and interpreted the cardiac monitored which showed an underlying rhythm of: Sinus rhythm  Problem List / ED Course / Critical interventions / Medication management  Patient resents for worsening cellulitis.  This is despite trial of oral antibiotics as well as a dose of Dalvance yesterday.  Since yesterday, patient has had spread of swelling, erythema, and pain up her forearm.  She has not yet developed any systemic symptoms.  On exam, she is well-appearing.  Exam findings of left hand and wrist are consistent with cellulitis.  Given her failure of Dalvance, patient to be treated with Ancef for nonpurulent cellulitis.  Given her worsening spread, patient to be admitted for observation.  If IV Ancef fails to improve, fungal source should be considered.  She remained hemodynamically stable in the ED.  Lab work is reassuring.  X-ray of left hand area did not show any presence of foreign bodies.  She was admitted for further management. I ordered medication including Ancef for cellulitis; Percocet for analgesia; IV fluids for hydration Reevaluation of the  patient after these medicines showed  that the patient improved I have reviewed the patients home medicines and have made adjustments as needed   Social Determinants of Health:  Has access to outpatient care         Final Clinical Impression(s) / ED Diagnoses Final diagnoses:  Cellulitis of left upper extremity    Rx / DC Orders ED Discharge Orders     None         Godfrey Pick, MD 01/23/22 [phone]

## 2022-01-23 NOTE — H&P (Signed)
History and Physical    Stephanie Ross [id number]:[id number] DOB: 1966-03-14 DOA: 01/23/2022  PCP: [facility]   Patient coming from: Home  I have personally briefly reviewed patient's old medical records in [place]  Chief Complaint: Right hand swelling  HPI: Stephanie Ross is a 56 y.o. female with medical history significant for COPD, asthma, chronic respiratory failure on 3 L, bipolar disorder, ADHD, coronary artery disease, hypertension, stroke. Patient presented to the ED with complaints of swelling to her left hand of 5 days duration.  A week ago- 10/23, her cat jumped onto her hand, given its nails into her hand- the wound has since healed.  2 days later she started having redness pain and swelling to same extremity. She went to her primary care provider's office, she was given a shot of Rocephin, and started on Duricef-cefadroxil, which she reports compliance with that she took for 3-1/2 days.  She came to the ED yesterday due to persistent symptoms, was given the option of admission, or a single dose of dalbavancin.  She opted for the latter, and was given return precautions also.   Swelling and redness has remained the same or slightly improved, but due to worsening pain patient came back to the ED today. No fever no chills.  No vomiting.  Patient was in the ED 10/11, for cellulitis involving the right hand, which started after she was scratched by her cat, she was given ceftriaxone and a course of Duricef which she took with resolution of her cellulitis.  ED Course: Afebrile, temperature 97.9.  Stable vitals.  WBC 7.7.  Lactic acid 1.3 > 2.7.  X-ray of the left hand without acute osseous abnormality. IV cefazolin given.  1 L bolus given.  Hospitalist to admit for left upper extremity cellulitis having failed outpatient therapy.  Review of Systems: As per HPI all other systems reviewed and negative.  Past Medical History:  Diagnosis Date    ADHD (attention deficit hyperactivity disorder)    Anxiety    Arthritis    Asthma    Bipolar disorder ([place])    Cancer (HCC)    left lung cancer   CHF (congestive heart failure) (HCC)    COPD (chronic obstructive pulmonary disease) (HCC)    Coronary artery disease    Depression    Dyspnea    GERD (gastroesophageal reflux disease)    Hypertension    On home oxygen therapy    3L O2 via [place] continuously   Peripheral vascular disease ([place])    Pneumonia    Stroke ([place])    x 2 with left sided sensation deficits    Past Surgical History:  Procedure Laterality Date   BILATERAL CARPAL TUNNEL RELEASE     CERVICAL ABLATION       reports that she quit smoking about 2 years ago. Her smoking use included cigarettes. She smoked an average of .25 packs per day. She has never used smokeless tobacco. She reports that she does not currently use drugs after having used the following drugs: Marijuana. She reports that she does not drink alcohol.  Allergies  Allergen Reactions   Amoxicillin Other (See Comments)    Has patient had a PCN reaction causing immediate rash, facial/tongue/throat swelling, SOB or lightheadedness with hypotension: Yes Has patient had a PCN reaction causing severe rash involving mucus membranes or skin necrosis: Yes Has patient had a PCN reaction that required hospitalization:yes Has patient had a PCN reaction occurring within the  last 10 years: yes If all of the above answers are "NO", then may proceed with Cephalosporin use.    Prednisone Other (See Comments)    Full body rash    Doxycycline Other (See Comments)    Other reaction(s): Other (See Comments) "caused fluid in lungs" "caused fluid in lungs"    Metoprolol     Other reaction(s): Dizziness   Other Other (See Comments)    Tide detergent - rash    Family History  Problem Relation Age of Onset   Colon polyps Father    Breast cancer Sister    Colon polyps Sister    Breast cancer Paternal Grandmother     Colon cancer Neg Hx     Prior to Admission medications   Medication Sig Start Date End Date Taking? Authorizing Provider  albuterol (PROVENTIL HFA;VENTOLIN HFA) 108 (90 Base) MCG/ACT inhaler Inhale 2 puffs into the lungs every 4 (four) hours as needed for wheezing or shortness of breath. 05/07/17   Noemi Chapel, MD  albuterol (PROVENTIL) (2.5 MG/3ML) 0.083% nebulizer solution Take 2.5 mg by nebulization every 6 (six) hours as needed for wheezing or shortness of breath.    [provider]  aspirin 81 MG chewable tablet Chew 81 mg by mouth daily.    [provider]  atorvastatin (LIPITOR) 40 MG tablet Take 40 mg by mouth daily. 05/30/21   [provider]  baclofen (LIORESAL) 10 MG tablet Take 10 mg by mouth 3 (three) times daily as needed for muscle spasms. 10/26/20   [provider]  budesonide-formoterol (SYMBICORT) 160-4.5 MCG/ACT inhaler Inhale 2 puffs into the lungs 2 (two) times daily.    [provider]  cetirizine (ZYRTEC) 10 MG tablet Take 10 mg by mouth daily.    [provider]  clopidogrel (PLAVIX) 75 MG tablet Take 75 mg by mouth daily. 09/08/19   [provider]  Coenzyme Q10 (COQ10) 100 MG CAPS Take 100 mg by mouth daily.     [provider]  DALIRESP 500 MCG TABS tablet Take 500 mcg by mouth daily. 11/15/20   [provider]  ergocalciferol (VITAMIN D2) 1.25 MG (50000 UT) capsule Take 50,000 Units by mouth every Monday.    [provider]  esomeprazole (NEXIUM) 20 MG capsule Take 20 mg by mouth daily at 12 noon.    [provider]  furosemide (LASIX) 20 MG tablet Take 20 mg by mouth every other day. Patient not taking: Reported on 01/11/2022 11/08/20   [provider]  HYDROcodone-acetaminophen (NORCO/VICODIN) 5-325 MG tablet Take 2 tablets by mouth every 4 (four) hours as needed. 01/23/22   Kommor, [place], MD  losartan (COZAAR) 25 MG tablet Take 25 mg by mouth daily. Patient not  taking: Reported on 01/11/2022 03/03/21   [provider]  meloxicam (MOBIC) 7.5 MG tablet 1 tablet 04/25/21   [provider]  montelukast (SINGULAIR) 10 MG tablet Take 10 mg by mouth at bedtime. 08/02/19   [provider]  nortriptyline (PAMELOR) 10 MG capsule Take 20 mg by mouth daily. Patient not taking: Reported on 01/11/2022 10/06/19   [provider]  tiotropium (SPIRIVA) 18 MCG inhalation capsule Place 1 capsule (18 mcg total) into inhaler and inhale daily. 10/06/20   Versie Starks, PA-C    Physical Exam: Vitals:   01/23/22 1255 01/23/22 1256 01/23/22 1800  BP:  128/60 114/72  Pulse:  90 80  Resp:  20 18  Temp:  97.9 F (36.6 C)  TempSrc:  Oral   SpO2:  92% 98%  Weight: 97.4 kg    Height: 5\' 2"  (1.575 m)      Constitutional: NAD, calm, comfortable Vitals:   01/23/22 1255 01/23/22 1256 01/23/22 1800  BP:  128/60 114/72  Pulse:  90 80  Resp:  20 18  Temp:  97.9 F (36.6 C)   TempSrc:  Oral   SpO2:  92% 98%  Weight: 97.4 kg    Height: 5\' 2"  (1.575 m)     Eyes: PERRL, lids and conjunctivae normal ENMT: Mucous membranes are moist.   Neck: normal, supple, no masses, no thyromegaly Respiratory: clear to auscultation bilaterally, no wheezing, no crackles. Normal respiratory effort. No accessory muscle use.  Cardiovascular: Regular rate and rhythm, no murmurs / rubs / gallops. No extremity edema.  Abdomen: no tenderness, no masses palpated. No hepatosplenomegaly..  Musculoskeletal: no clubbing / cyanosis. No joint deformity upper and lower extremities.  Skin: Swelling and redness to left upper extremity, tenderness, with differential warmth, pulses present, No induration.  No open wounds.  No purulence. Neurologic: No apparent cranial nerve abnormality normal extremities spontaneously Psychiatric: Normal judgment and insight. Alert and oriented x 3. Normal mood.      Labs on Admission: I have personally reviewed following labs and  imaging studies  CBC: Recent Labs  Lab 01/22/22 1951 01/23/22 1328  WBC 7.7 7.7  NEUTROABS  --  5.9  HGB 10.3* 10.0*  HCT 35.7* 34.6*  MCV 72.4* 72.4*  PLT 344 381   Basic Metabolic Panel: Recent Labs  Lab 01/22/22 1951 01/23/22 1328  NA 135 134*  K 3.2* 3.7  CL 95* 95*  CO2 30 29  GLUCOSE 171* 173*  BUN 10 11  CREATININE 0.60 0.61  CALCIUM 9.0 9.3   GFR: Estimated Creatinine Clearance: 84.5 mL/min (by C-G formula based on SCr of 0.61 mg/dL). Liver Function Tests: Recent Labs  Lab 01/22/22 1951 01/23/22 1328  AST 17 19  ALT 21 23  ALKPHOS 83 79  BILITOT 0.7 0.6  PROT 7.7 7.6  ALBUMIN 3.8 3.6   Coagulation Profile: Recent Labs  Lab 01/23/22 1328  INR 1.0    Radiological Exams on Admission: DG Hand 2 View Left  Result Date: 01/23/2022 CLINICAL DATA:  Cellulitis to left hand with swelling redness and pain. EXAM: LEFT HAND - 2 VIEW COMPARISON:  none available FINDINGS: AP and lateral views demonstrate degenerative changes of the base of the thumb. No acute fracture or dislocation. No osseous destruction. No soft tissue gas. There may be dorsal soft tissue swelling about the carpal and metacarpal bones on the lateral view. No radiopaque foreign object. IMPRESSION: No acute osseous abnormality. Electronically Signed   By: Abigail Miyamoto M.D.   On: 01/23/2022 17:51    EKG: Independently reviewed.  Sinus rhythm rate 79, QTc 476.  No significant change from prior.  Assessment/Plan Principal Problem:   Cellulitis of left hand Active Problems:   CVA (cerebral vascular accident) ([place])   Essential hypertension   Malignant neoplasm of left lung ([place])   Chronic respiratory failure with hypoxia (HCC)  Assessment and Plan: * Cellulitis of left hand With erythema redness swelling differential warmth, worsening tenderness.  Symptoms started 5 days ago, and has persisted.  Her cat dog its needles into dorsal aspect of hand.  She received a dose of Rocephin and at least 3  days of Duricef, and a dose of dalbavancin yesterday.  Close out for sepsis.  Lactic acid 1.3 >  3.7.  WBC 7.7.  X-ray right hand -negative for acute abnormality.  -IV ceftriaxone and IV vancomycin -IV morphine 2 mg x 1,, continue Oxycodone/acetaminophen 5/325 every 4 hourly -1 L bolus given - HGbA1c, HIV -Follow-up blood cultures drawn in the ED  Chronic respiratory failure with hypoxia (HCC) History of COPD with chronic respiratory failure on 3 L.  Stable. -Resume albuterol nebs as needed, Daliresp, Singulair, Spiriva inhaler, Symbicort  Malignant neoplasm of left lung ([place]) Follows with Dr. Grayland Ormond. -Completed chemotherapy ~December 2022.  CT scan results from December 15, 2021, without obvious evidence of recurrent or progressive disease.  Essential hypertension Stable.   CVA (cerebral vascular accident) ([place]) Stable.   - Resume aspirin, atorvastatin, Plavix.   DVT prophylaxis: Lovenox Code Status: Full Family Communication: None at bedside Disposition Plan: ~ 2 days Consults called: None Admission status: Inpt Med-surg I certify that at the point of admission it is my clinical judgment that the patient will require inpatient hospital care spanning beyond 2 midnights from the point of admission due to high intensity of service, high risk for further deterioration and high frequency of surveillance required.   Author: Bethena Roys, MD 01/23/2022 10:44 PM  For on call review [email or web address].

## 2022-01-23 NOTE — Assessment & Plan Note (Addendum)
With erythema redness swelling differential warmth, worsening tenderness.  Symptoms started 5 days ago, and has persisted.  Her cat dog its needles into dorsal aspect of hand.  She received a dose of Rocephin and at least 3 days of Duricef, and a dose of dalbavancin yesterday.  Close out for sepsis.  Lactic acid 1.3 > 3.7.  WBC 7.7.  X-ray right hand -negative for acute abnormality.  -IV ceftriaxone and IV vancomycin -IV morphine 2 mg x 1,, continue Oxycodone/acetaminophen 5/325 every 4 hourly -1 L bolus given - HGbA1c, HIV -Follow-up blood cultures drawn in the ED

## 2022-01-23 NOTE — ED Notes (Signed)
Pt removed self from monitor, updated vitals not taken. Went over [employer] verbalized understanding. Family member wheeled to out to lobby with home O2

## 2022-01-23 NOTE — Progress Notes (Cosign Needed)
Pharmacy Antibiotic Note  Stephanie Ross is a 56 y.o. female admitted on 01/23/2022 with cellulitis.  Pharmacy has been consulted for vancomycin dosing.  Plan: Vancomycin 2000 mg IV x1. Vancomycin 750 IV every 12 hours.  Goal AUC 400-550. Calculated AUC 424.  Height: 5\' 2"  (157.5 cm) Weight: 99.3 kg (218 lb 14.7 oz) IBW/kg (Calculated) : 50.1  Temp (24hrs), Avg:98 F (36.7 C), Min:97.7 F (36.5 C), Max:98.7 F (37.1 C)  Recent Labs  Lab 01/22/22 1951 01/22/22 1952 01/22/22 2200 01/23/22 1328 01/23/22 1537  WBC 7.7  --   --  7.7  --   CREATININE 0.60  --   --  0.61  --   LATICACIDVEN  --  0.9 1.2 1.3 0.7    Estimated Creatinine Clearance: 85.5 mL/min (by C-G formula based on SCr of 0.61 mg/dL).    Allergies  Allergen Reactions   Amoxicillin Other (See Comments)    Has patient had a PCN reaction causing immediate rash, facial/tongue/throat swelling, SOB or lightheadedness with hypotension: Yes Has patient had a PCN reaction causing severe rash involving mucus membranes or skin necrosis: Yes Has patient had a PCN reaction that required hospitalization:yes Has patient had a PCN reaction occurring within the last 10 years: yes If all of the above answers are "NO", then may proceed with Cephalosporin use.    Prednisone Other (See Comments)    Full body rash    Doxycycline Other (See Comments)    Other reaction(s): Other (See Comments) "caused fluid in lungs" "caused fluid in lungs"    Metoprolol     Other reaction(s): Dizziness   Other Other (See Comments)    Tide detergent - rash    Antimicrobials this admission: Cefazolin 10/30 x1 Cefadroxil 10/11 >> 10/18 Ceftriaxone 10/11 x1  Thank you for allowing pharmacy to be a part of this patient's care.  Lerry Liner, PharmD Candidate class of 2024 01/23/2022 10:54 PM

## 2022-01-23 NOTE — ED Triage Notes (Signed)
Pt presents to ED with complaints of cellulitis to left hand swelling, redness and pain. Pt was in ED yesterday. Pt thinks cat scratched her. Pt states was given new antibiotic last night and took 1 dose per IV last night.

## 2022-01-23 NOTE — Assessment & Plan Note (Signed)
Stable.   - Resume aspirin, atorvastatin, Plavix.

## 2022-01-23 NOTE — ED Notes (Signed)
Report given to [place], RN on floor.

## 2022-01-23 NOTE — Assessment & Plan Note (Addendum)
Follows with Dr. Grayland Ormond. -Completed chemotherapy ~December 2022.  CT scan results from December 15, 2021, without obvious evidence of recurrent or progressive disease.

## 2022-01-23 NOTE — Assessment & Plan Note (Signed)
History of COPD with chronic respiratory failure on 3 L.  Stable. -Resume albuterol nebs as needed, Daliresp, Singulair, Spiriva inhaler, Symbicort

## 2022-01-24 ENCOUNTER — Other Ambulatory Visit (HOSPITAL_COMMUNITY): Payer: Self-pay

## 2022-01-24 DIAGNOSIS — I639 Cerebral infarction, unspecified: Secondary | ICD-10-CM | POA: Diagnosis not present

## 2022-01-24 DIAGNOSIS — J9611 Chronic respiratory failure with hypoxia: Secondary | ICD-10-CM | POA: Diagnosis not present

## 2022-01-24 DIAGNOSIS — I10 Essential (primary) hypertension: Secondary | ICD-10-CM | POA: Diagnosis not present

## 2022-01-24 DIAGNOSIS — L03114 Cellulitis of left upper limb: Secondary | ICD-10-CM | POA: Diagnosis not present

## 2022-01-24 LAB — BASIC METABOLIC PANEL
Anion gap: 7 (ref 5–15)
BUN: 11 mg/dL (ref 6–20)
CO2: 35 mmol/L — ABNORMAL HIGH (ref 22–32)
Calcium: 9.1 mg/dL (ref 8.9–10.3)
Chloride: 96 mmol/L — ABNORMAL LOW (ref 98–111)
Creatinine, Ser: 0.67 mg/dL (ref 0.44–1.00)
GFR, Estimated: >60 mL/min (ref >60)
Glucose, Bld: 194 mg/dL — ABNORMAL HIGH (ref 70–99)
Potassium: 3.4 mmol/L — ABNORMAL LOW (ref 3.5–5.1)
Sodium: 138 mmol/L (ref 135–145)

## 2022-01-24 LAB — CBC
HCT: 33.8 % — ABNORMAL LOW (ref 36.0–46.0)
Hemoglobin: 9.5 g/dL — ABNORMAL LOW (ref 12.0–15.0)
MCH: 21 pg — ABNORMAL LOW (ref 26.0–34.0)
MCHC: 28.1 g/dL — ABNORMAL LOW (ref 30.0–36.0)
MCV: 74.6 fL — ABNORMAL LOW (ref 80.0–100.0)
Platelets: 311 10*3/uL (ref 150–400)
RBC: 4.53 MIL/uL (ref 3.87–5.11)
RDW: 30.7 % — ABNORMAL HIGH (ref 11.5–15.5)
WBC: 6.1 10*3/uL (ref 4.0–10.5)
nRBC: 0 % (ref 0.0–0.2)

## 2022-01-24 LAB — GLUCOSE, CAPILLARY: Glucose-Capillary: 208 mg/dL — ABNORMAL HIGH (ref 70–99)

## 2022-01-24 LAB — HEMOGLOBIN A1C
Hgb A1c MFr Bld: 6.6 % — ABNORMAL HIGH (ref 4.8–5.6)
Mean Plasma Glucose: 142.72 mg/dL

## 2022-01-24 LAB — HIV ANTIBODY (ROUTINE TESTING W REFLEX): HIV Screen 4th Generation wRfx: NONREACTIVE

## 2022-01-24 MED ORDER — CEFAZOLIN SODIUM-DEXTROSE 1-4 GM/50ML-% IV SOLN
1.0000 g | Freq: Three times a day (TID) | INTRAVENOUS | Status: DC
  Administered 2022-01-24 – 2022-01-25 (×4): 1 g via INTRAVENOUS
  Filled 2022-01-24 (×9): qty 50

## 2022-01-24 MED ORDER — ALBUTEROL SULFATE (2.5 MG/3ML) 0.083% IN NEBU
2.5000 mg | INHALATION_SOLUTION | Freq: Three times a day (TID) | RESPIRATORY_TRACT | Status: DC
  Administered 2022-01-24 – 2022-01-25 (×2): 2.5 mg via RESPIRATORY_TRACT
  Filled 2022-01-24 (×2): qty 3

## 2022-01-24 MED ORDER — POTASSIUM CHLORIDE CRYS ER 20 MEQ PO TBCR
40.0000 meq | EXTENDED_RELEASE_TABLET | Freq: Once | ORAL | Status: AC
  Administered 2022-01-24: 40 meq via ORAL
  Filled 2022-01-24: qty 2

## 2022-01-24 NOTE — TOC Progression Note (Signed)
  Transition of Care [facility]) Screening Note   Patient Details  Name: Stephanie Ross Date of Birth: 14-[name]-1965   Transition of Care [facility]) CM/SW Contact:    Boneta Lucks, RN Phone Number: 01/24/2022, 2:36 PM  Consulting ID  Transition of Care Department [facility]) has reviewed patient and no TOC needs have been identified at this time. We will continue to monitor patient advancement through interdisciplinary progression rounds. If new patient transition needs arise, please place a TOC consult.      Barriers to Discharge: Continued Medical Work up

## 2022-01-24 NOTE — Progress Notes (Signed)
PROGRESS NOTE    Stephanie Ross  VOZ:[id number] DOB: Aug 06, 1965 DOA: 01/23/2022 PCP: [facility]    Brief Narrative:  56 year old female admitted to the hospital after she developed cellulitis from a cat scratch on her left hand.  She has been taking antibiotics as an outpatient including oral cefadroxil and received a dose of IV dalbavancin.  Her symptoms persisted.  She was admitted for IV antibiotics.   Assessment & Plan:   Principal Problem:   Cellulitis of left hand Active Problems:   CVA (cerebral vascular accident) ([place])   Essential hypertension   Malignant neoplasm of left lung (HCC)   Chronic respiratory failure with hypoxia (HCC)   Cellulitis of left hand -She had an injury related to her cat on the dorsal aspect of her left hand -She was treated with cefadroxil as an outpatient if needed versus a dose of dalbavancin -Symptoms have persisted which caused her to come back to the emergency room -Received ceftriaxone and IV vancomycin -Currently on Ancef -Overall erythema appears to be improving -She did not have any palpable fluctuations or signs of underlying abscess -Keep left upper extremity elevated  Chronic respiratory failure with hypoxia COPD -No shortness of breath at this time -On baseline oxygen requirement of 3 L -Continue bronchodilators, inhaled steroids  Lung cancer, left lung -Continue follow-up with oncology  Hypertension -Stable  History of CVA -Continue aspirin, Plavix and statin   DVT prophylaxis: enoxaparin (LOVENOX) injection 40 mg Start: 01/24/22 1000  Code Status: Full code Family Communication: Discussed with patient Disposition Plan: Status is: Inpatient Remains inpatient appropriate because: Discharge home once stable This has improved     Consultants:    Procedures:    Antimicrobials:  Ancef   Subjective: She has swelling in her right hand.  Erythema does not appear to be  extending beyond margins and does not appear to be as prominent  Objective: Vitals:   01/24/22 0800 01/24/22 0830 01/24/22 1219 01/24/22 1400  BP: 111/71   (!) 148/58  Pulse: 83   95  Resp: 16   18  Temp: (!) 97.1 F (36.2 C)   98.2 F (36.8 C)  TempSrc:    Oral  SpO2: 97% 97% 94% 98%  Weight:      Height:        Intake/Output Summary (Last 24 hours) at 01/24/2022 1554 Last data filed at 01/24/2022 1502 Gross per 24 hour  Intake 820 ml  Output --  Net 820 ml   Filed Weights   01/23/22 1255 01/23/22 2000  Weight: 97.4 kg 99.3 kg    Examination:  General exam: Appears calm and comfortable  Respiratory system: Clear to auscultation. Respiratory effort normal. Cardiovascular system: S1 & S2 heard, RRR. No JVD, murmurs, rubs, gallops or clicks. No pedal edema. Gastrointestinal system: Abdomen is nondistended, soft and nontender. No organomegaly or masses felt. Normal bowel sounds heard. Central nervous system: Alert and oriented. No focal neurological deficits. Extremities: Patient feels that overall erythema is improving, her hand is also less painful Skin: No rashes, lesions or ulcers Psychiatry: Judgement and insight appear normal. Mood & affect appropriate.        Data Reviewed: I have personally reviewed following labs and imaging studies  CBC: Recent Labs  Lab 01/22/22 1951 01/23/22 1328 01/24/22 0423  WBC 7.7 7.7 6.1  NEUTROABS  --  5.9  --   HGB 10.3* 10.0* 9.5*  HCT 35.7* 34.6* 33.8*  MCV 72.4* 72.4* 74.6*  PLT  344 335 970   Basic Metabolic Panel: Recent Labs  Lab 01/22/22 1951 01/23/22 1328 01/24/22 0423  NA 135 134* 138  K 3.2* 3.7 3.4*  CL 95* 95* 96*  CO2 30 29 35*  GLUCOSE 171* 173* 194*  BUN 10 11 11   CREATININE 0.60 0.61 0.67  CALCIUM 9.0 9.3 9.1   GFR: Estimated Creatinine Clearance: 85.5 mL/min (by C-G formula based on SCr of 0.67 mg/dL). Liver Function Tests: Recent Labs  Lab 01/22/22 1951 01/23/22 1328  AST 17 19  ALT  21 23  ALKPHOS 83 79  BILITOT 0.7 0.6  PROT 7.7 7.6  ALBUMIN 3.8 3.6   No results for input(s): "LIPASE", "AMYLASE" in the last 168 hours. No results for input(s): "AMMONIA" in the last 168 hours. Coagulation Profile: Recent Labs  Lab 01/23/22 1328  INR 1.0   Cardiac Enzymes: No results for input(s): "CKTOTAL", "CKMB", "CKMBINDEX", "TROPONINI" in the last 168 hours. BNP (last 3 results) No results for input(s): "PROBNP" in the last 8760 hours. HbA1C: Recent Labs    01/24/22 0423  HGBA1C 6.6*   CBG: Recent Labs  Lab 01/23/22 2213  GLUCAP 208*   Lipid Profile: No results for input(s): "CHOL", "HDL", "LDLCALC", "TRIG", "CHOLHDL", "LDLDIRECT" in the last 72 hours. Thyroid Function Tests: No results for input(s): "TSH", "T4TOTAL", "FREET4", "T3FREE", "THYROIDAB" in the last 72 hours. Anemia Panel: No results for input(s): "VITAMINB12", "FOLATE", "FERRITIN", "TIBC", "IRON", "RETICCTPCT" in the last 72 hours. Sepsis Labs: Recent Labs  Lab 01/22/22 1952 01/22/22 2200 01/23/22 1328 01/23/22 1537  LATICACIDVEN 0.9 1.2 1.3 0.7    Recent Results (from the past 240 hour(s))  Culture, blood (single)     Status: None (Preliminary result)   Collection Time: 01/22/22  7:53 PM   Specimen: BLOOD LEFT ARM  Result Value Ref Range Status   Specimen Description BLOOD LEFT ARM  Final   Special Requests   Final    BOTTLES DRAWN AEROBIC AND ANAEROBIC Blood Culture adequate volume   Culture   Final    NO GROWTH 2 DAYS Performed at [facility], [address] [place], [place] [address]    Report Status PENDING  Incomplete  Blood Culture (routine x 2)     Status: None (Preliminary result)   Collection Time: 01/23/22  1:28 PM   Specimen: BLOOD LEFT ARM  Result Value Ref Range Status   Specimen Description BLOOD LEFT ARM  Final   Special Requests   Final    BOTTLES DRAWN AEROBIC AND ANAEROBIC Blood Culture results may not be optimal due to an excessive volume of blood received in  culture bottles   Culture   Final    NO GROWTH < 12 HOURS Performed at [facility], [address], [place], [place] [address]    Report Status PENDING  Incomplete  Blood Culture (routine x 2)     Status: None (Preliminary result)   Collection Time: 01/23/22  1:28 PM   Specimen: Right Antecubital; Blood  Result Value Ref Range Status   Specimen Description RIGHT ANTECUBITAL  Final   Special Requests   Final    BOTTLES DRAWN AEROBIC AND ANAEROBIC Blood Culture results may not be optimal due to an excessive volume of blood received in culture bottles   Culture   Final    NO GROWTH < 12 HOURS Performed at [facility], [address], [place], [place] [address]    Report Status PENDING  Incomplete  Radiology Studies: DG Hand 2 View Left  Result Date: 01/23/2022 CLINICAL DATA:  Cellulitis to left hand with swelling redness and pain. EXAM: LEFT HAND - 2 VIEW COMPARISON:  none available FINDINGS: AP and lateral views demonstrate degenerative changes of the base of the thumb. No acute fracture or dislocation. No osseous destruction. No soft tissue gas. There may be dorsal soft tissue swelling about the carpal and metacarpal bones on the lateral view. No radiopaque foreign object. IMPRESSION: No acute osseous abnormality. Electronically Signed   By: Abigail Miyamoto M.D.   On: 01/23/2022 17:51        Scheduled Meds:  aspirin  81 mg Oral Daily   atorvastatin  40 mg Oral Daily   clopidogrel  75 mg Oral Daily   enoxaparin (LOVENOX) injection  40 mg Subcutaneous Q24H   mometasone-formoterol  2 puff Inhalation BID   montelukast  10 mg Oral QHS   pantoprazole  40 mg Oral Daily   roflumilast  500 mcg Oral Daily   umeclidinium bromide  1 puff Inhalation Daily   Continuous Infusions:   ceFAZolin (ANCEF) IV 1 g (01/24/22 1420)     LOS: 1 day    Time spent: 74mins    Kathie Dike, MD [facility]   If 7PM-7AM, please contact  night-coverage [email or web address]  01/24/2022, 3:54 PM

## 2022-01-24 NOTE — Progress Notes (Signed)
Patient not sure if she can take incruse inhaler. Will notify morning therapist To hold tiil confirmed. Patient states last time it put fluid in her lungs???

## 2022-01-24 NOTE — Progress Notes (Signed)
Patient alert,and oriented x's 4. Ambulates in room with assist. Oxygen at 3 liters nasal canula, patient c/o pain to left arm today, pain management provided .Plan of care on going.

## 2022-01-25 DIAGNOSIS — L03114 Cellulitis of left upper limb: Secondary | ICD-10-CM | POA: Diagnosis not present

## 2022-01-25 LAB — BASIC METABOLIC PANEL
Anion gap: 7 (ref 5–15)
BUN: 7 mg/dL (ref 6–20)
CO2: 32 mmol/L (ref 22–32)
Calcium: 9 mg/dL (ref 8.9–10.3)
Chloride: 97 mmol/L — ABNORMAL LOW (ref 98–111)
Creatinine, Ser: 0.64 mg/dL (ref 0.44–1.00)
GFR, Estimated: >60 mL/min (ref >60)
Glucose, Bld: 180 mg/dL — ABNORMAL HIGH (ref 70–99)
Potassium: 3.7 mmol/L (ref 3.5–5.1)
Sodium: 136 mmol/L (ref 135–145)

## 2022-01-25 LAB — CBC
HCT: 34.4 % — ABNORMAL LOW (ref 36.0–46.0)
Hemoglobin: 9.7 g/dL — ABNORMAL LOW (ref 12.0–15.0)
MCH: 20.9 pg — ABNORMAL LOW (ref 26.0–34.0)
MCHC: 28.2 g/dL — ABNORMAL LOW (ref 30.0–36.0)
MCV: 74 fL — ABNORMAL LOW (ref 80.0–100.0)
Platelets: 320 10*3/uL (ref 150–400)
RBC: 4.65 MIL/uL (ref 3.87–5.11)
WBC: 5.7 10*3/uL (ref 4.0–10.5)
nRBC: 0 % (ref 0.0–0.2)

## 2022-01-25 MED ORDER — LEVOFLOXACIN 500 MG PO TABS: 500.0000 mg | ORAL_TABLET | Freq: Every day | ORAL | 0 refills | Status: DC

## 2022-01-25 MED ORDER — ESOMEPRAZOLE MAGNESIUM 20 MG PO CPDR: 20.0000 mg | DELAYED_RELEASE_CAPSULE | Freq: Every day | ORAL | 0 refills | Status: DC

## 2022-01-25 NOTE — Discharge Summary (Signed)
Physician Discharge Summary  STORY CONTI HYW:[id number] DOB: 1965/07/26 DOA: 01/23/2022  PCP: [facility] date: 01/23/2022  Discharge date: 01/25/2022  Admitted From:Home  Disposition:  Home  Recommendations for Outpatient Follow-up:  Follow up with PCP in 1-2 weeks Continue on Levaquin for approximately 1 week as recommended by ID to complete course of treatment for cellulitis related to cat scratch Continue other home medications as prior  Home Health: None  Equipment/Devices: Wears chronic 3 L nasal cannula oxygen  Discharge Condition:Stable  CODE STATUS: Full  Diet recommendation: Heart Healthy  Brief/Interim Summary: 56 year old female with history of COPD with chronic hypoxemia, prior lung cancer, hypertension, and CVA who was admitted to the hospital after she developed cellulitis from a cat scratch on her left hand.  She has been taking antibiotics as an outpatient including oral cefadroxil and received a dose of IV dalbavancin.  Her symptoms persisted.  She was admitted for IV antibiotics with Ancef for couple days and now appears stable for discharge.  ID recommending Levaquin for another week for gram-negative organism coverage.  Discharge Diagnoses:  Principal Problem:   Cellulitis of left hand Active Problems:   CVA (cerebral vascular accident) ([place])   Essential hypertension   Malignant neoplasm of left lung ([place])   Chronic respiratory failure with hypoxia ([place])  Principal discharge diagnosis: Left hand cellulitis related to cat scratch.  Discharge Instructions  Discharge Instructions     Diet - low sodium heart healthy   Complete by: As directed    Increase activity slowly   Complete by: As directed       Allergies as of 01/25/2022       Reactions   Amoxicillin Other (See Comments)   Has patient had a PCN reaction causing immediate rash, facial/tongue/throat swelling, SOB or lightheadedness with hypotension:  Yes Has patient had a PCN reaction causing severe rash involving mucus membranes or skin necrosis: Yes Has patient had a PCN reaction that required hospitalization:yes Has patient had a PCN reaction occurring within the last 10 years: yes If all of the above answers are "NO", then may proceed with Cephalosporin use.   Prednisone Other (See Comments)   Full body rash    Doxycycline Other (See Comments)   Other reaction(s): Other (See Comments) "caused fluid in lungs" "caused fluid in lungs"   Metoprolol    Other reaction(s): Dizziness   Other Other (See Comments)   Tide detergent - rash        Medication List     TAKE these medications    albuterol (2.5 MG/3ML) 0.083% nebulizer solution Commonly known as: PROVENTIL Take 2.5 mg by nebulization every 6 (six) hours as needed for wheezing or shortness of breath.   albuterol 108 (90 Base) MCG/ACT inhaler Commonly known as: VENTOLIN HFA Inhale 2 puffs into the lungs every 4 (four) hours as needed for wheezing or shortness of breath.   aspirin 81 MG chewable tablet Chew 81 mg by mouth daily.   atorvastatin 40 MG tablet Commonly known as: LIPITOR Take 40 mg by mouth daily.   baclofen 10 MG tablet Commonly known as: LIORESAL Take 10 mg by mouth 3 (three) times daily as needed for muscle spasms.   budesonide-formoterol 160-4.5 MCG/ACT inhaler Commonly known as: SYMBICORT Inhale 2 puffs into the lungs 2 (two) times daily.   cetirizine 10 MG tablet Commonly known as: ZYRTEC Take 10 mg by mouth daily.   clopidogrel 75 MG tablet Commonly known as: PLAVIX  Take 75 mg by mouth daily.   CoQ10 100 MG Caps Take 100 mg by mouth daily.   Daliresp 500 MCG Tabs tablet Generic drug: roflumilast Take 500 mcg by mouth daily.   ergocalciferol 1.25 MG (50000 UT) capsule Commonly known as: VITAMIN D2 Take 50,000 Units by mouth every Monday.   esomeprazole 20 MG capsule Commonly known as: NEXIUM Take 1 capsule (20 mg total) by  mouth daily at 12 noon.   glimepiride 2 MG tablet Commonly known as: AMARYL Take 2 mg by mouth every morning.   HYDROcodone-acetaminophen 5-325 MG tablet Commonly known as: NORCO/VICODIN Take 2 tablets by mouth every 4 (four) hours as needed. What changed: reasons to take this   ibuprofen 200 MG tablet Commonly known as: ADVIL Take 200 mg by mouth every 6 (six) hours as needed for mild pain or moderate pain.   levofloxacin 500 MG tablet Commonly known as: Levaquin Take 1 tablet (500 mg total) by mouth daily for 7 days.   losartan 25 MG tablet Commonly known as: COZAAR Take 25 mg by mouth daily.   montelukast 10 MG tablet Commonly known as: SINGULAIR Take 10 mg by mouth at bedtime.   tiotropium 18 MCG inhalation capsule Commonly known as: SPIRIVA Place 1 capsule (18 mcg total) into inhaler and inhale daily.        Follow-up Information     [facility] Schedule an appointment as soon as possible for a visit in 1 week(s).   Contact information: [address] [place] [address] [phone]                Allergies  Allergen Reactions   Amoxicillin Other (See Comments)    Has patient had a PCN reaction causing immediate rash, facial/tongue/throat swelling, SOB or lightheadedness with hypotension: Yes Has patient had a PCN reaction causing severe rash involving mucus membranes or skin necrosis: Yes Has patient had a PCN reaction that required hospitalization:yes Has patient had a PCN reaction occurring within the last 10 years: yes If all of the above answers are "NO", then may proceed with Cephalosporin use.    Prednisone Other (See Comments)    Full body rash    Doxycycline Other (See Comments)    Other reaction(s): Other (See Comments) "caused fluid in lungs" "caused fluid in lungs"    Metoprolol     Other reaction(s): Dizziness   Other Other (See Comments)    Tide detergent - rash    Consultations: Discussed with  ID   Procedures/Studies: DG Hand 2 View Left  Result Date: 01/23/2022 CLINICAL DATA:  Cellulitis to left hand with swelling redness and pain. EXAM: LEFT HAND - 2 VIEW COMPARISON:  none available FINDINGS: AP and lateral views demonstrate degenerative changes of the base of the thumb. No acute fracture or dislocation. No osseous destruction. No soft tissue gas. There may be dorsal soft tissue swelling about the carpal and metacarpal bones on the lateral view. No radiopaque foreign object. IMPRESSION: No acute osseous abnormality. Electronically Signed   By: Abigail Miyamoto M.D.   On: 01/23/2022 17:51   DG Hand Complete Right  Result Date: 01/03/2022 CLINICAL DATA:  dorsal hand STS/redness EXAM: RIGHT HAND - COMPLETE 3+ VIEW COMPARISON:  None Available. FINDINGS: No cortical erosion or destruction. There is no evidence of fracture or dislocation. First carpometacarpal joint moderate degenerative changes. Soft tissues are unremarkable. No retained radiopaque foreign body. IMPRESSION: 1. No radiographic finding of osteomyelitis. 2.  No acute  displaced fracture or dislocation. Electronically Signed   By: Iven Finn M.D.   On: 01/03/2022 16:08   US Venous Img Upper Uni Left (DVT)  Result Date: 12/27/2021 CLINICAL DATA:  Left upper extremity pain and muscle spasms for the past week. Evaluate for DVT. EXAM: LEFT UPPER EXTREMITY VENOUS DOPPLER ULTRASOUND TECHNIQUE: Gray-scale sonography with graded compression, as well as color Doppler and duplex ultrasound were performed to evaluate the upper extremity deep venous system from the level of the subclavian vein and including the jugular, axillary, basilic, radial, ulnar and upper cephalic vein. Spectral Doppler was utilized to evaluate flow at rest and with distal augmentation maneuvers. COMPARISON:  None Available. FINDINGS: Contralateral Subclavian Vein: Respiratory phasicity is normal and symmetric with the symptomatic side. No evidence of thrombus. Normal  compressibility. Internal Jugular Vein: No evidence of thrombus. Normal compressibility, respiratory phasicity and response to augmentation. Subclavian Vein: No evidence of thrombus. Normal compressibility, respiratory phasicity and response to augmentation. Axillary Vein: No evidence of thrombus. Normal compressibility, respiratory phasicity and response to augmentation. Cephalic Vein: No evidence of thrombus. Normal compressibility, respiratory phasicity and response to augmentation. Basilic Vein: No evidence of thrombus. Normal compressibility, respiratory phasicity and response to augmentation. Brachial Veins: No evidence of thrombus. Normal compressibility, respiratory phasicity and response to augmentation. Radial Veins: No evidence of thrombus. Normal compressibility, respiratory phasicity and response to augmentation. Ulnar Veins: No evidence of thrombus. Normal compressibility, respiratory phasicity and response to augmentation. Other Findings:  None visualized. IMPRESSION: No evidence of DVT within the left upper extremity. Electronically Signed   By: Sandi Mariscal M.D.   On: 12/27/2021 12:27     Discharge Exam: Vitals:   01/25/22 0758 01/25/22 0830  BP:  119/65  Pulse:  91  Resp:    Temp:  97.9 F (36.6 C)  SpO2: 93% 99%   Vitals:   01/24/22 2045 01/25/22 0515 01/25/22 0758 01/25/22 0830  BP: (!) 120/59 (!) 129/53  119/65  Pulse: 91 81  91  Resp: 19 13    Temp: 98.1 F (36.7 C) 97.9 F (36.6 C)  97.9 F (36.6 C)  TempSrc: Oral Oral  Oral  SpO2: 100% 96% 93% 99%  Weight:      Height:        General: Pt is alert, awake, not in acute distress Cardiovascular: RRR, S1/S2 +, no rubs, no gallops Respiratory: CTA bilaterally, no wheezing, no rhonchi Abdominal: Soft, NT, ND, bowel sounds + Extremities: no edema, no cyanosis    The results of significant diagnostics from this hospitalization (including imaging, microbiology, ancillary and laboratory) are listed below for reference.      Microbiology: Recent Results (from the past 240 hour(s))  Culture, blood (single)     Status: None (Preliminary result)   Collection Time: 01/22/22  7:53 PM   Specimen: BLOOD LEFT ARM  Result Value Ref Range Status   Specimen Description BLOOD LEFT ARM  Final   Special Requests   Final    BOTTLES DRAWN AEROBIC AND ANAEROBIC Blood Culture adequate volume   Culture   Final    NO GROWTH 3 DAYS Performed at [facility], [address] [place], [place] [address]    Report Status PENDING  Incomplete  Blood Culture (routine x 2)     Status: None (Preliminary result)   Collection Time: 01/23/22  1:28 PM   Specimen: BLOOD LEFT ARM  Result Value Ref Range Status   Specimen Description BLOOD LEFT ARM  Final   Special Requests  Final    BOTTLES DRAWN AEROBIC AND ANAEROBIC Blood Culture results may not be optimal due to an excessive volume of blood received in culture bottles   Culture   Final    NO GROWTH 2 DAYS Performed at [facility], [address] [place], [place] [address]    Report Status PENDING  Incomplete  Blood Culture (routine x 2)     Status: None (Preliminary result)   Collection Time: 01/23/22  1:28 PM   Specimen: Right Antecubital; Blood  Result Value Ref Range Status   Specimen Description RIGHT ANTECUBITAL  Final   Special Requests   Final    BOTTLES DRAWN AEROBIC AND ANAEROBIC Blood Culture results may not be optimal due to an excessive volume of blood received in culture bottles   Culture   Final    NO GROWTH 2 DAYS Performed at [facility], [address], [place], [place] [address]    Report Status PENDING  Incomplete     Labs: BNP (last 3 results) No results for input(s): "BNP" in the last 8760 hours. Basic Metabolic Panel: Recent Labs  Lab 01/22/22 1951 01/23/22 1328 01/24/22 0423 01/25/22 0438  NA 135 134* 138 136  K 3.2* 3.7 3.4* 3.7  CL 95* 95* 96* 97*  CO2 30 29 35* 32  GLUCOSE 171* 173* 194* 180*  BUN 10 11 11 7   CREATININE 0.60 0.61  0.67 0.64  CALCIUM 9.0 9.3 9.1 9.0   Liver Function Tests: Recent Labs  Lab 01/22/22 1951 01/23/22 1328  AST 17 19  ALT 21 23  ALKPHOS 83 79  BILITOT 0.7 0.6  PROT 7.7 7.6  ALBUMIN 3.8 3.6   No results for input(s): "LIPASE", "AMYLASE" in the last 168 hours. No results for input(s): "AMMONIA" in the last 168 hours. CBC: Recent Labs  Lab 01/22/22 1951 01/23/22 1328 01/24/22 0423 01/25/22 0438  WBC 7.7 7.7 6.1 5.7  NEUTROABS  --  5.9  --   --   HGB 10.3* 10.0* 9.5* 9.7*  HCT 35.7* 34.6* 33.8* 34.4*  MCV 72.4* 72.4* 74.6* 74.0*  PLT 344 335 311 320   Cardiac Enzymes: No results for input(s): "CKTOTAL", "CKMB", "CKMBINDEX", "TROPONINI" in the last 168 hours. BNP: Invalid input(s): "POCBNP" CBG: Recent Labs  Lab 01/23/22 2213  GLUCAP 208*   D-Dimer No results for input(s): "DDIMER" in the last 72 hours. Hgb A1c Recent Labs    01/24/22 0423  HGBA1C 6.6*   Lipid Profile No results for input(s): "CHOL", "HDL", "LDLCALC", "TRIG", "CHOLHDL", "LDLDIRECT" in the last 72 hours. Thyroid function studies No results for input(s): "TSH", "T4TOTAL", "T3FREE", "THYROIDAB" in the last 72 hours.  Invalid input(s): "FREET3" Anemia work up No results for input(s): "VITAMINB12", "FOLATE", "FERRITIN", "TIBC", "IRON", "RETICCTPCT" in the last 72 hours. Urinalysis    Component Value Date/Time   COLORURINE YELLOW 01/23/2022 1843   APPEARANCEUR HAZY (A) 01/23/2022 1843   LABSPEC 1.008 01/23/2022 1843   PHURINE 5.0 01/23/2022 1843   GLUCOSEU NEGATIVE 01/23/2022 1843   HGBUR NEGATIVE 01/23/2022 1843   BILIRUBINUR NEGATIVE 01/23/2022 1843   KETONESUR NEGATIVE 01/23/2022 1843   PROTEINUR NEGATIVE 01/23/2022 1843   NITRITE NEGATIVE 01/23/2022 1843   LEUKOCYTESUR NEGATIVE 01/23/2022 1843   Sepsis Labs Recent Labs  Lab 01/22/22 1951 01/23/22 1328 01/24/22 0423 01/25/22 0438  WBC 7.7 7.7 6.1 5.7   Microbiology Recent Results (from the past 240 hour(s))  Culture, blood  (single)     Status: None (Preliminary result)   Collection Time:  01/22/22  7:53 PM   Specimen: BLOOD LEFT ARM  Result Value Ref Range Status   Specimen Description BLOOD LEFT ARM  Final   Special Requests   Final    BOTTLES DRAWN AEROBIC AND ANAEROBIC Blood Culture adequate volume   Culture   Final    NO GROWTH 3 DAYS Performed at [facility], [address] [place], [place] [address]    Report Status PENDING  Incomplete  Blood Culture (routine x 2)     Status: None (Preliminary result)   Collection Time: 01/23/22  1:28 PM   Specimen: BLOOD LEFT ARM  Result Value Ref Range Status   Specimen Description BLOOD LEFT ARM  Final   Special Requests   Final    BOTTLES DRAWN AEROBIC AND ANAEROBIC Blood Culture results may not be optimal due to an excessive volume of blood received in culture bottles   Culture   Final    NO GROWTH 2 DAYS Performed at [facility], [address], [place], [place] [address]    Report Status PENDING  Incomplete  Blood Culture (routine x 2)     Status: None (Preliminary result)   Collection Time: 01/23/22  1:28 PM   Specimen: Right Antecubital; Blood  Result Value Ref Range Status   Specimen Description RIGHT ANTECUBITAL  Final   Special Requests   Final    BOTTLES DRAWN AEROBIC AND ANAEROBIC Blood Culture results may not be optimal due to an excessive volume of blood received in culture bottles   Culture   Final    NO GROWTH 2 DAYS Performed at [facility], [address], [place], [place] [address]    Report Status PENDING  Incomplete     Time coordinating discharge: 35 minutes  SIGNED:   Rodena Goldmann, DO [facility] 01/25/2022, 10:52 AM  If 7PM-7AM, please contact night-coverage [email or web address]

## 2022-01-25 NOTE — Progress Notes (Signed)
Patient discharged with instructions given on medications and follow up visits,patient verbalized understanding. Prescriptions sent to Pharmacy of choice documented on AVS. Discharged with oxygen at 3 liters nasal canula. IV discontinued,catheter intact. Accompanied by staff to an awaiting  vehicle.

## 2022-01-25 NOTE — Care Management Important Message (Signed)
Important Message  Patient Details  Name: Stephanie Ross MRN: [id number] Date of Birth: 10-07-1965   Medicare Important Message Given:  N/A - LOS <3 / Initial given by admissions     Tommy Medal 01/25/2022, 11:12 AM

## 2022-01-27 LAB — CULTURE, BLOOD (SINGLE)
Culture: NO GROWTH
Special Requests: ADEQUATE

## 2022-01-28 LAB — CULTURE, BLOOD (ROUTINE X 2)
Culture: NO GROWTH
Culture: NO GROWTH

## 2022-01-30 ENCOUNTER — Encounter (HOSPITAL_COMMUNITY): Payer: Self-pay | Admitting: Emergency Medicine

## 2022-01-30 ENCOUNTER — Observation Stay (HOSPITAL_COMMUNITY)
Admission: EM | Admit: 2022-01-30 | Discharge: 2022-01-31 | Disposition: A | Discharge status: Home / Self Care | Payer: Medicare Other | Attending: Family Medicine | Admitting: Family Medicine

## 2022-01-30 ENCOUNTER — Emergency Department (HOSPITAL_COMMUNITY): Payer: Medicare Other

## 2022-01-30 ENCOUNTER — Other Ambulatory Visit: Payer: Self-pay

## 2022-01-30 DIAGNOSIS — Z794 Long term (current) use of insulin: Secondary | ICD-10-CM | POA: Diagnosis not present

## 2022-01-30 DIAGNOSIS — I110 Hypertensive heart disease with heart failure: Secondary | ICD-10-CM | POA: Diagnosis not present

## 2022-01-30 DIAGNOSIS — L03114 Cellulitis of left upper limb: Principal | ICD-10-CM | POA: Insufficient documentation

## 2022-01-30 DIAGNOSIS — J45909 Unspecified asthma, uncomplicated: Secondary | ICD-10-CM | POA: Insufficient documentation

## 2022-01-30 DIAGNOSIS — Z7951 Long term (current) use of inhaled steroids: Secondary | ICD-10-CM | POA: Insufficient documentation

## 2022-01-30 DIAGNOSIS — Z7902 Long term (current) use of antithrombotics/antiplatelets: Secondary | ICD-10-CM | POA: Diagnosis not present

## 2022-01-30 DIAGNOSIS — I2510 Atherosclerotic heart disease of native coronary artery without angina pectoris: Secondary | ICD-10-CM | POA: Diagnosis not present

## 2022-01-30 DIAGNOSIS — E119 Type 2 diabetes mellitus without complications: Secondary | ICD-10-CM | POA: Insufficient documentation

## 2022-01-30 DIAGNOSIS — I509 Heart failure, unspecified: Secondary | ICD-10-CM | POA: Insufficient documentation

## 2022-01-30 DIAGNOSIS — Z79899 Other long term (current) drug therapy: Secondary | ICD-10-CM | POA: Diagnosis not present

## 2022-01-30 DIAGNOSIS — Z8673 Personal history of transient ischemic attack (TIA), and cerebral infarction without residual deficits: Secondary | ICD-10-CM | POA: Diagnosis not present

## 2022-01-30 DIAGNOSIS — E669 Obesity, unspecified: Secondary | ICD-10-CM

## 2022-01-30 DIAGNOSIS — J9611 Chronic respiratory failure with hypoxia: Secondary | ICD-10-CM | POA: Insufficient documentation

## 2022-01-30 DIAGNOSIS — J449 Chronic obstructive pulmonary disease, unspecified: Secondary | ICD-10-CM | POA: Diagnosis not present

## 2022-01-30 DIAGNOSIS — Z7984 Long term (current) use of oral hypoglycemic drugs: Secondary | ICD-10-CM | POA: Diagnosis not present

## 2022-01-30 DIAGNOSIS — Z7982 Long term (current) use of aspirin: Secondary | ICD-10-CM | POA: Diagnosis not present

## 2022-01-30 DIAGNOSIS — Z87891 Personal history of nicotine dependence: Secondary | ICD-10-CM | POA: Diagnosis not present

## 2022-01-30 DIAGNOSIS — Z85118 Personal history of other malignant neoplasm of bronchus and lung: Secondary | ICD-10-CM | POA: Insufficient documentation

## 2022-01-30 DIAGNOSIS — E1169 Type 2 diabetes mellitus with other specified complication: Secondary | ICD-10-CM | POA: Diagnosis not present

## 2022-01-30 DIAGNOSIS — I10 Essential (primary) hypertension: Secondary | ICD-10-CM

## 2022-01-30 DIAGNOSIS — Z8679 Personal history of other diseases of the circulatory system: Secondary | ICD-10-CM | POA: Diagnosis not present

## 2022-01-30 DIAGNOSIS — L0390 Cellulitis, unspecified: Secondary | ICD-10-CM | POA: Diagnosis present

## 2022-01-30 LAB — CBC WITH DIFFERENTIAL/PLATELET
Abs Immature Granulocytes: 0.03 10*3/uL (ref 0.00–0.07)
Basophils Absolute: 0 10*3/uL (ref 0.0–0.1)
Basophils Relative: 0 %
Eosinophils Absolute: 0.1 10*3/uL (ref 0.0–0.5)
Eosinophils Relative: 1 %
HCT: 33.9 % — ABNORMAL LOW (ref 36.0–46.0)
Hemoglobin: 9.8 g/dL — ABNORMAL LOW (ref 12.0–15.0)
Immature Granulocytes: 0 %
Lymphocytes Relative: 13 %
Lymphs Abs: 1.1 10*3/uL (ref 0.7–4.0)
MCH: 21.1 pg — ABNORMAL LOW (ref 26.0–34.0)
MCHC: 28.9 g/dL — ABNORMAL LOW (ref 30.0–36.0)
MCV: 73.1 fL — ABNORMAL LOW (ref 80.0–100.0)
Monocytes Absolute: 0.9 10*3/uL (ref 0.1–1.0)
Monocytes Relative: 11 %
Neutro Abs: 6.5 10*3/uL (ref 1.7–7.7)
Neutrophils Relative %: 75 %
Platelets: 320 10*3/uL (ref 150–400)
RBC: 4.64 MIL/uL (ref 3.87–5.11)
WBC: 8.7 10*3/uL (ref 4.0–10.5)
nRBC: 0 % (ref 0.0–0.2)

## 2022-01-30 LAB — C-REACTIVE PROTEIN: CRP: 4.3 mg/dL — ABNORMAL HIGH (ref <1.0)

## 2022-01-30 LAB — COMPREHENSIVE METABOLIC PANEL
ALT: 20 U/L (ref 0–44)
AST: 18 U/L (ref 15–41)
Albumin: 3.6 g/dL (ref 3.5–5.0)
Alkaline Phosphatase: 88 U/L (ref 38–126)
Anion gap: 8 (ref 5–15)
BUN: 9 mg/dL (ref 6–20)
CO2: 31 mmol/L (ref 22–32)
Calcium: 9.2 mg/dL (ref 8.9–10.3)
Chloride: 98 mmol/L (ref 98–111)
Creatinine, Ser: 0.55 mg/dL (ref 0.44–1.00)
GFR, Estimated: >60 mL/min (ref >60)
Glucose, Bld: 94 mg/dL (ref 70–99)
Potassium: 3.9 mmol/L (ref 3.5–5.1)
Sodium: 137 mmol/L (ref 135–145)
Total Bilirubin: 0.7 mg/dL (ref 0.3–1.2)
Total Protein: 7.4 g/dL (ref 6.5–8.1)

## 2022-01-30 LAB — GLUCOSE, CAPILLARY
Glucose-Capillary: 107 mg/dL — ABNORMAL HIGH (ref 70–99)
Glucose-Capillary: 151 mg/dL — ABNORMAL HIGH (ref 70–99)

## 2022-01-30 LAB — SEDIMENTATION RATE: Sed Rate: 45 mm/hr — ABNORMAL HIGH (ref 0–22)

## 2022-01-30 MED ORDER — FERROUS FUMARATE 324 (106 FE) MG PO TABS
1.0000 | ORAL_TABLET | Freq: Every day | ORAL | Status: DC
  Administered 2022-01-31: 106 mg via ORAL
  Filled 2022-01-30 (×3): qty 1

## 2022-01-30 MED ORDER — SALINE SPRAY 0.65 % NA SOLN
1.0000 | NASAL | Status: DC | PRN
  Administered 2022-01-30: 1 via NASAL
  Filled 2022-01-30: qty 44

## 2022-01-30 MED ORDER — ONDANSETRON HCL 4 MG/2ML IJ SOLN: 4.0000 mg | Freq: Four times a day (QID) | INTRAMUSCULAR | Status: DC | PRN

## 2022-01-30 MED ORDER — HYDRALAZINE HCL 20 MG/ML IJ SOLN: 10.0000 mg | Freq: Four times a day (QID) | INTRAMUSCULAR | Status: DC | PRN

## 2022-01-30 MED ORDER — ACETAMINOPHEN 650 MG RE SUPP: 650.0000 mg | Freq: Four times a day (QID) | RECTAL | Status: DC | PRN

## 2022-01-30 MED ORDER — METRONIDAZOLE 500 MG/100ML IV SOLN
500.0000 mg | Freq: Two times a day (BID) | INTRAVENOUS | Status: DC
  Administered 2022-01-30 – 2022-01-31 (×2): 500 mg via INTRAVENOUS
  Filled 2022-01-30 (×2): qty 100

## 2022-01-30 MED ORDER — INSULIN ASPART 100 UNIT/ML IJ SOLN
0.0000 [IU] | Freq: Three times a day (TID) | INTRAMUSCULAR | Status: DC
  Administered 2022-01-31 (×2): 2 [IU] via SUBCUTANEOUS

## 2022-01-30 MED ORDER — SODIUM CHLORIDE 0.9% FLUSH: 3.0000 mL | Freq: Two times a day (BID) | INTRAVENOUS | Status: DC

## 2022-01-30 MED ORDER — ATORVASTATIN CALCIUM 40 MG PO TABS
40.0000 mg | ORAL_TABLET | Freq: Every day | ORAL | Status: DC
  Administered 2022-01-31: 40 mg via ORAL
  Filled 2022-01-30: qty 1

## 2022-01-30 MED ORDER — SODIUM CHLORIDE 0.9% FLUSH
3.0000 mL | Freq: Two times a day (BID) | INTRAVENOUS | Status: DC
  Administered 2022-01-30: 3 mL via INTRAVENOUS

## 2022-01-30 MED ORDER — BISACODYL 10 MG RE SUPP: 10.0000 mg | Freq: Every day | RECTAL | Status: DC | PRN

## 2022-01-30 MED ORDER — SODIUM CHLORIDE 0.9 % IV SOLN
2.0000 g | Freq: Once | INTRAVENOUS | Status: AC
  Administered 2022-01-30: 2 g via INTRAVENOUS
  Filled 2022-01-30: qty 20

## 2022-01-30 MED ORDER — HEPARIN SODIUM (PORCINE) 5000 UNIT/ML IJ SOLN
5000.0000 [IU] | Freq: Three times a day (TID) | INTRAMUSCULAR | Status: DC
  Administered 2022-01-30 – 2022-01-31 (×2): 5000 [IU] via SUBCUTANEOUS
  Filled 2022-01-30 (×2): qty 1

## 2022-01-30 MED ORDER — CLOPIDOGREL BISULFATE 75 MG PO TABS
75.0000 mg | ORAL_TABLET | Freq: Every day | ORAL | Status: DC
  Administered 2022-01-31: 75 mg via ORAL
  Filled 2022-01-30: qty 1

## 2022-01-30 MED ORDER — SODIUM CHLORIDE 0.9% FLUSH
3.0000 mL | INTRAVENOUS | Status: DC | PRN
  Administered 2022-01-31: 3 mL via INTRAVENOUS

## 2022-01-30 MED ORDER — SODIUM CHLORIDE 0.9 % IV SOLN: INTRAVENOUS | Status: AC

## 2022-01-30 MED ORDER — ASPIRIN 81 MG PO CHEW
81.0000 mg | CHEWABLE_TABLET | Freq: Every day | ORAL | Status: DC
  Administered 2022-01-31: 81 mg via ORAL
  Filled 2022-01-30: qty 1

## 2022-01-30 MED ORDER — TRAZODONE HCL 50 MG PO TABS
50.0000 mg | ORAL_TABLET | Freq: Every evening | ORAL | Status: DC | PRN
  Administered 2022-01-30: 50 mg via ORAL
  Filled 2022-01-30: qty 1

## 2022-01-30 MED ORDER — OXYCODONE HCL 5 MG PO TABS
5.0000 mg | ORAL_TABLET | Freq: Three times a day (TID) | ORAL | Status: DC | PRN
  Administered 2022-01-30 – 2022-01-31 (×3): 5 mg via ORAL
  Filled 2022-01-30 (×3): qty 1

## 2022-01-30 MED ORDER — SODIUM CHLORIDE 0.9 % IV SOLN
2.0000 g | Freq: Once | INTRAVENOUS | Status: AC
  Administered 2022-01-30: 2 g via INTRAVENOUS
  Filled 2022-01-30: qty 12.5

## 2022-01-30 MED ORDER — INSULIN ASPART 100 UNIT/ML IJ SOLN: 0.0000 [IU] | Freq: Every day | INTRAMUSCULAR | Status: DC

## 2022-01-30 MED ORDER — SODIUM CHLORIDE 0.9 % IV SOLN: INTRAVENOUS | Status: DC | PRN

## 2022-01-30 MED ORDER — VANCOMYCIN HCL 1250 MG/250ML IV SOLN: 1250.0000 mg | INTRAVENOUS | Status: DC

## 2022-01-30 MED ORDER — ACETAMINOPHEN 325 MG PO TABS: 650.0000 mg | ORAL_TABLET | Freq: Four times a day (QID) | ORAL | Status: DC | PRN

## 2022-01-30 MED ORDER — INSULIN ASPART 100 UNIT/ML IJ SOLN: 3.0000 [IU] | Freq: Three times a day (TID) | INTRAMUSCULAR | Status: DC

## 2022-01-30 MED ORDER — HYDROCODONE-ACETAMINOPHEN 5-325 MG PO TABS
2.0000 | ORAL_TABLET | Freq: Once | ORAL | Status: AC
  Administered 2022-01-30: 2 via ORAL
  Filled 2022-01-30: qty 2

## 2022-01-30 MED ORDER — VANCOMYCIN HCL 2000 MG/400ML IV SOLN
2000.0000 mg | Freq: Once | INTRAVENOUS | Status: AC
  Administered 2022-01-30: 2000 mg via INTRAVENOUS
  Filled 2022-01-30: qty 400

## 2022-01-30 MED ORDER — ALBUTEROL SULFATE (2.5 MG/3ML) 0.083% IN NEBU: 2.5000 mg | INHALATION_SOLUTION | RESPIRATORY_TRACT | Status: DC | PRN

## 2022-01-30 MED ORDER — TIOTROPIUM BROMIDE MONOHYDRATE 18 MCG IN CAPS
18.0000 ug | ORAL_CAPSULE | Freq: Every day | RESPIRATORY_TRACT | Status: DC
  Administered 2022-01-31: 18 ug via RESPIRATORY_TRACT

## 2022-01-30 MED ORDER — ONDANSETRON HCL 4 MG PO TABS: 4.0000 mg | ORAL_TABLET | Freq: Four times a day (QID) | ORAL | Status: DC | PRN

## 2022-01-30 MED ORDER — POLYETHYLENE GLYCOL 3350 17 G PO PACK: 17.0000 g | PACK | Freq: Every day | ORAL | Status: DC | PRN

## 2022-01-30 MED ORDER — LOSARTAN POTASSIUM 50 MG PO TABS
25.0000 mg | ORAL_TABLET | Freq: Every day | ORAL | Status: DC
  Administered 2022-01-31: 25 mg via ORAL
  Filled 2022-01-30: qty 1

## 2022-01-30 NOTE — ED Notes (Signed)
ED Provider at bedside. 

## 2022-01-30 NOTE — ED Triage Notes (Signed)
Pt still taking abx for cellulitis of left hand. Pt states left hand started swelling and getting red again last night. Swelling and redness noted to posterior hand into wrist and lower arm area. Radial pulse present.

## 2022-01-30 NOTE — H&P (Signed)
Patient Demographics:    Stephanie Ross, is a 56 y.o. female  MRN: [id number]   DOB - 1965/08/19  Admit Date - 01/30/2022  Outpatient Primary MD for the patient is [facility]:   Assessment and Plan:  1)Recurrent cellulitis of left hand--- H/o Cat bite/scratch -States that after recent discharge and symptoms actually went away -Left hand redness warmth tenderness swelling and pain started back within the last 24 hours or so -Initial bout of left hand cellulitis back in October was apparently secondary to cat scratch/cat bite injury that got infected---since then patient has been treated with Cefadroxil, Dalbavancin, IV vancomycin, IV Rocephin, Ancef, and Levaquin -EDP gave cefepime and vancomycin today -Treat empirically with Rocephin and Flagyl (history of penicillin allergy) -Recent HIV test is negative -- Lt Hand xray shows Dorsal soft tissue swelling along the hand is minimally improved. -No radiopaque foreign body or gas identified in the soft tissues. -WBC 8.7 --ESR 45 it was 47 a week ago -CRP 4.3 it was 7.4 a week ago -She is diabetic -  2)DM2-A1c 6.6 reflecting excellent diabetic control PTA -Continue insulin regimen Use Novolog/Humalog Sliding scale insulin with Accu-Cheks/Fingersticks as ordered   3)Chronic respiratory failure with hypoxia (HCC) History of COPD with chronic respiratory failure on 3 L.   -No acute COPD exacerbation at this time -Continue supplemental oxygen -Bronchodilators   4)Malignant neoplasm of left lung ([place]) Follows with Dr. Grayland Ormond. -Completed chemotherapy ~December 2022.  CT scan results from December 15, 2021, without obvious evidence of recurrent or progressive disease.   5)Essential hypertension Stable. -Continue  losartan   6)H/o CAD and H/o CVA (cerebral vascular accident) ([place]) Stable.   - Resume aspirin, atorvastatin, Plavix.  7)Morbid Obesity- -Low calorie diet, portion control and increase physical activity discussed with patient -Body mass index is 40.06 kg/m.  8)H/o chronic Anemia--- -HGB 9.8 which is close to patient's baseline -No bleeding concerns at this time  Disposition/Need for in-Hospital Stay- patient unable to be discharged at this time due to current left arm cellulitis requiring IV antibiotics -Possible discharge home in 24 to 48 hours on oral Omnicef and Flagyl  Dispo: The patient is from: Home              Anticipated d/c is to: Home              Anticipated d/c date is: 1 day              Patient currently is not medically stable to d/c. Barriers: Not Clinically Stable-   With History of - Reviewed by me  Past Medical History:  Diagnosis Date   ADHD (attention deficit hyperactivity disorder)    Anxiety    Arthritis    Asthma    Bipolar disorder (HCC)    Cancer ([place])    left lung cancer   CHF (congestive heart failure) ([place])    COPD (  chronic obstructive pulmonary disease) (HCC)    Coronary artery disease    Depression    Dyspnea    GERD (gastroesophageal reflux disease)    Hypertension    On home oxygen therapy    3L O2 via [place] continuously   Peripheral vascular disease ([place])    Pneumonia    Stroke ([place])    x 2 with left sided sensation deficits      Past Surgical History:  Procedure Laterality Date   BILATERAL CARPAL TUNNEL RELEASE     CERVICAL ABLATION      Chief Complaint  Patient presents with   Cellulitis      HPI:    Stephanie Ross  is a 56 y.o. female  with medical history significant for morbid obesity, COPD,  chronic respiratory failure on 3 L, bipolar disorder, ADHD, coronary artery disease, hypertension, history of prior stroke who was discharged on 01/25/2022 after treatment for left hand cellulitis and was discharged on oral  Levaquin, patient said left hand cellulitis improved significantly .  However over the last 24 hours she noticed increased swelling pain redness and warmth of the left hand again -Initial bout of left hand cellulitis back in October was apparently secondary to cat scratch/cat bite injury that got infected---since then patient has been treated with Cefadroxil, Dalbavancin, IV vancomycin, IV Rocephin, Ancef, and Levaquin -No fever  Or chills  -Denies repeat injury In ED--- Lt Hand xray shows Dorsal soft tissue swelling along the hand is minimally improved. -No radiopaque foreign body or gas identified in the soft tissues. -WBC 8.7 -HGB 9.8 which is close to patient's baseline CMP essentially WNL -ESR 45 it was 47 a week ago -CRP 4.3 it was 7.4 a week ago   Review of systems:    In addition to the HPI above,   A full Review of  Systems was done, all other systems reviewed are negative except as noted above in HPI , .   Social History:  Reviewed by me    Social History   Tobacco Use   Smoking status: Former    Packs/day: 0.25    Types: Cigarettes    Quit date: 07/17/2019    Years since quitting: 2.5   Smokeless tobacco: Never  Substance Use Topics   Alcohol use: No    Family History :  Reviewed by me    Family History  Problem Relation Age of Onset   Colon polyps Father    Breast cancer Sister    Colon polyps Sister    Breast cancer Paternal Grandmother    Colon cancer Neg Hx      Home Medications:   Prior to Admission medications   Medication Sig Start Date End Date Taking? Authorizing Provider  albuterol (PROVENTIL HFA;VENTOLIN HFA) 108 (90 Base) MCG/ACT inhaler Inhale 2 puffs into the lungs every 4 (four) hours as needed for wheezing or shortness of breath. 05/07/17  Yes Noemi Chapel, MD  albuterol (PROVENTIL) (2.5 MG/3ML) 0.083% nebulizer solution Take 2.5 mg by nebulization every 6 (six) hours as needed for wheezing or shortness of breath.   Yes [provider]  aspirin 81 MG chewable tablet Chew 81 mg by mouth daily.   Yes [provider]  atorvastatin (LIPITOR) 40 MG tablet Take 40 mg by mouth daily. 05/30/21  Yes [provider]  baclofen (LIORESAL) 10 MG tablet Take 10 mg by mouth 3 (three) times daily as needed for muscle spasms. 10/26/20  Yes [provider]  budesonide-formoterol (SYMBICORT) 160-4.5 MCG/ACT inhaler Inhale 2 puffs into the lungs 2 (two) times daily.   Yes [provider]  cetirizine (ZYRTEC) 10 MG tablet Take 10 mg by mouth daily.   Yes [provider]  clopidogrel (PLAVIX) 75 MG tablet Take 75 mg by mouth daily. 09/08/19  Yes [provider]  Coenzyme Q10 (COQ10) 100 MG CAPS Take 100 mg by mouth daily.    Yes [provider]  DALIRESP 500 MCG TABS tablet Take 500 mcg by mouth daily. 11/15/20  Yes [provider]  ergocalciferol (VITAMIN D2) 1.25 MG (50000 UT) capsule Take 50,000 Units by mouth every Monday.   Yes [provider]  Ferrous Sulfate (IRON PO) Take 1 tablet by mouth in the morning and at bedtime.   Yes [provider]  glimepiride (AMARYL) 2 MG tablet Take 2 mg by mouth every morning. 01/11/22  Yes [provider]  ibuprofen (ADVIL) 200 MG tablet Take 200 mg by mouth every 6 (six) hours as needed for mild pain or moderate pain.   Yes [provider]  levofloxacin (LEVAQUIN) 500 MG tablet Take 1 tablet (500 mg total) by mouth daily for 7 days. 01/25/22 02/01/22 Yes Shah, Pratik D, DO  losartan (COZAAR) 25 MG tablet Take 25 mg by mouth daily. 03/03/21  Yes [provider]  meloxicam (MOBIC) 7.5 MG tablet Take 7.5 mg by mouth daily.   Yes [provider]  montelukast (SINGULAIR) 10 MG tablet Take 10 mg by mouth at bedtime. 08/02/19  Yes [provider]  tiotropium (SPIRIVA) 18 MCG inhalation capsule Place 1 capsule (18 mcg total) into inhaler and inhale daily. 10/06/20  Yes Fisher, Linden Dolin, PA-C  HYDROcodone-acetaminophen (NORCO/VICODIN) 5-325 MG tablet Take 2 tablets by mouth every 4 (four) hours as needed. Patient not taking: Reported on 01/30/2022 01/23/22   Teressa Lower, MD     Allergies:     Allergies  Allergen Reactions   Amoxicillin Other (See Comments)    Has patient had a PCN reaction causing immediate rash, facial/tongue/throat swelling, SOB or lightheadedness with hypotension: Yes Has patient had a PCN reaction causing severe rash involving mucus membranes or skin necrosis: Yes Has patient had a PCN reaction that required hospitalization:yes Has patient had a PCN reaction occurring within the last 10 years: yes If all of the above answers are "NO", then may proceed with Cephalosporin use.    Prednisone Other (See Comments)    Full body rash    Doxycycline Other (See Comments)    Other reaction(s): Other (See Comments) "caused fluid in lungs" "caused fluid in lungs"    Metoprolol     Other reaction(s): Dizziness   Other Other (See Comments)    Tide detergent - rash   Umeclidinium Other (See Comments)    "Fluid on lungs"     Physical Exam:   Vitals  Blood pressure 116/62, pulse 80, temperature 98.4 F (36.9 C), temperature source Oral, resp. rate 18, height _0  (1.575 m), weight 99.3 kg, SpO2 97 %.  Physical Examination: General appearance - alert,  obese, in no distress  Mental status - alert, oriented to person, place, and time,  Eyes - sclera anicteric Neck - supple, no JVD elevation , Chest - clear  to auscultation bilaterally, symmetrical air movement,  Heart - S1 and S2 normal, regular  Abdomen - soft, nontender, nondistended, +BS, increased truncal adiposity Neurological - screening mental status exam normal, neck supple without rigidity, cranial nerves II through  XII intact, DTR's normal and symmetric Extremities -   intact peripheral pulses  Skin - -left hand erythema, warmth, slight swelling and tenderness noted consistent with  cellulitis no open wounds or drainage -  Media Information  Document Information  Photos  Lt Hand  01/30/2022 17:43  Attached To:  Hospital Encounter on 01/30/22  Source Information  Roxan Hockey, MD  [facility]      Media Information  Document Information  Photos  Lt Hand  01/30/2022 17:42  Attached To:  Hospital Encounter on 01/30/22  Source Information  Roxan Hockey, MD  [facility]     Data Review:    CBC Recent Labs  Lab 01/24/22 0423 01/25/22 0438 01/30/22 1228  WBC 6.1 5.7 8.7  HGB 9.5* 9.7* 9.8*  HCT 33.8* 34.4* 33.9*  PLT 311 320 320  MCV 74.6* 74.0* 73.1*  MCH 21.0* 20.9* 21.1*  MCHC 28.1* 28.2* 28.9*  RDW 30.7* Not Measured Not Measured  LYMPHSABS  --   --  1.1  MONOABS  --   --  0.9  EOSABS  --   --  0.1  BASOSABS  --   --  0.0   ------------------------------------------------------------------------------------------------------------------  Chemistries  Recent Labs  Lab 01/24/22 0423 01/25/22 0438 01/30/22 1228  NA 138 136 137  K 3.4* 3.7 3.9  CL 96* 97* 98  CO2 35* 32 31  GLUCOSE 194* 180* 94  BUN _0 CREATININE 0.67 0.64 0.55  CALCIUM 9.1 9.0 9.2  AST  --   --  18  ALT  --   --  20  ALKPHOS  --   --  88  BILITOT  --   --  0.7   ------------------------------------------------------------------------------------------------------------------ estimated creatinine clearance is 85.5 mL/min (by C-G formula based on SCr of 0.55 mg/dL). ------------------------------------------------------------------------------------------------------------------ ------------------------------------------------------------------------------------------------------------------    Component Value Date/Time   BNP 190.5 (H) 11/19/2020 1704   Urinalysis    Component Value Date/Time   COLORURINE YELLOW 01/23/2022 1843   APPEARANCEUR HAZY (A) 01/23/2022 1843   LABSPEC 1.008 01/23/2022 1843   PHURINE 5.0 01/23/2022 1843    GLUCOSEU NEGATIVE 01/23/2022 1843   HGBUR NEGATIVE 01/23/2022 [place] 01/23/2022 [place] 01/23/2022 1843   PROTEINUR NEGATIVE 01/23/2022 1843   NITRITE NEGATIVE 01/23/2022 1843   LEUKOCYTESUR NEGATIVE 01/23/2022 1843    ----------------------------------------------------------------------------------------------------------------   Imaging Results:    DG Hand Complete Left  Result Date: 01/30/2022 CLINICAL DATA:  Retained foreign body in the hand, cellulitis EXAM: LEFT HAND - COMPLETE 3+ VIEW COMPARISON:  01/23/2022 FINDINGS: Loss of articular space and spurring at the first carpometacarpal articulation compatible with degenerative arthropathy. No well-defined radiopaque foreign body. No gas is observed in the soft tissues of the hand. No bony destructive findings. Dorsal soft tissue swelling along the hand is minimally improved from 01/23/2022. IMPRESSION: 1. Dorsal soft tissue swelling along the hand is minimally improved. 2. Degenerative arthropathy at the first carpometacarpal articulation. 3. No radiopaque foreign body or gas identified in the soft tissues. Electronically Signed   By: Van Clines M.D.   On: 01/30/2022 13:03    Radiological Exams on Admission: DG Hand Complete Left  Result Date: 01/30/2022 CLINICAL DATA:  Retained foreign body in the hand, cellulitis EXAM: LEFT HAND - COMPLETE 3+ VIEW COMPARISON:  01/23/2022 FINDINGS: Loss of articular space and spurring at the first carpometacarpal articulation compatible with degenerative arthropathy. No well-defined radiopaque foreign body. No gas is observed in the soft tissues  of the hand. No bony destructive findings. Dorsal soft tissue swelling along the hand is minimally improved from 01/23/2022. IMPRESSION: 1. Dorsal soft tissue swelling along the hand is minimally improved. 2. Degenerative arthropathy at the first carpometacarpal articulation. 3. No radiopaque foreign body or gas  identified in the soft tissues. Electronically Signed   By: Van Clines M.D.   On: 01/30/2022 13:03    DVT Prophylaxis -SCD /Heparin AM Labs Ordered, also please review Full Orders  Family Communication: Admission, patients condition and plan of care including tests being ordered have been discussed with the patient who indicate understanding and agree with the plan   Condition  -Stable  Roxan Hockey M.D on 01/30/2022 at 5:55 PM Go to [email or web address] -  for contact info  [facility] - Office  [phone]

## 2022-01-30 NOTE — ED Provider Notes (Signed)
[facility] EMERGENCY DEPARTMENT Provider Note  CSN: [id number] Arrival date & time: 01/30/22 1128  Chief Complaint(s) Cellulitis  HPI Stephanie Ross is a 56 y.o. female who presents emergency department for evaluation of left hand pain and swelling.  I have personally seen this patient twice for hand cellulitis and on my last evaluation on 01/22/2022, she had received a dose of dalbavancin but unfortunately broke through this at home, returned and was hospitalized for 4 days on IV Ancef.  Patient states that her symptoms had improved for a short amount of time but have since started to return.  She endorses left hand pain and swelling but denies numbness, tingling, weakness, nausea, vomiting or other systemic symptoms.   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   Patient Active Problem List   Diagnosis Date Noted   Cellulitis 01/30/2022   Cellulitis of left hand 01/23/2022   Chronic respiratory failure with hypoxia ([place]) 01/23/2022   Heme positive stool 06/09/2021   Mass of lower lobe of left lung 11/19/2020   Lobar pneumonia ([place]) 11/19/2020   Sepsis ([place]) 11/19/2020   HLD (hyperlipidemia) 11/19/2020   COPD exacerbation ([place]) 11/19/2020   Stroke ([place]) 11/19/2020   Depression 11/19/2020   Acute on chronic respiratory failure with hypoxia ([place]) 11/19/2020   HTN (hypertension) 11/19/2020   Elevated troponin 11/19/2020   Malignant neoplasm of  left lung [facility])    Pre-op evaluation    Essential hypertension    Aortic valve regurgitation    Acute appendicitis 10/14/2019   CVA (cerebral vascular accident) ([place]) 08/17/2019   Home Medication(s) Prior to Admission medications   Medication Sig Start Date End Date Taking? Authorizing Provider  albuterol (PROVENTIL HFA;VENTOLIN HFA) 108 (90 Base) MCG/ACT inhaler Inhale 2 puffs into the lungs every 4 (four) hours as needed for wheezing or shortness of breath. 05/07/17  Yes Noemi Chapel, MD  albuterol (PROVENTIL) (2.5 MG/3ML) 0.083% nebulizer solution Take 2.5 mg by nebulization every 6 (six) hours as needed for wheezing or shortness of breath.   Yes [provider]  aspirin 81 MG chewable tablet Chew 81 mg by mouth daily.   Yes [provider]  atorvastatin (LIPITOR) 40 MG tablet Take 40 mg by mouth daily. 05/30/21  Yes [provider]  baclofen (LIORESAL) 10 MG tablet Take 10 mg by mouth 3 (three) times daily as needed for muscle spasms. 10/26/20  Yes [provider]  budesonide-formoterol (SYMBICORT) 160-4.5 MCG/ACT inhaler Inhale 2 puffs into the lungs 2 (two) times daily.   Yes [provider]  cetirizine (ZYRTEC) 10 MG tablet Take 10 mg by mouth daily.   Yes [provider]  clopidogrel (PLAVIX) 75 MG tablet Take 75 mg by mouth daily. 09/08/19  Yes [provider]  Coenzyme Q10 (COQ10) 100 MG CAPS Take  100 mg by mouth daily.    Yes [provider]  DALIRESP 500 MCG TABS tablet Take 500 mcg by mouth daily. 11/15/20  Yes [provider]  ergocalciferol (VITAMIN D2) 1.25 MG (50000 UT) capsule Take 50,000 Units by mouth every Monday.   Yes [provider]  Ferrous Sulfate (IRON PO) Take 1 tablet by mouth in the morning and at bedtime.   Yes [provider]  glimepiride (AMARYL) 2 MG tablet Take 2 mg by mouth every morning. 01/11/22  Yes [provider]  ibuprofen (ADVIL) 200 MG tablet Take  200 mg by mouth every 6 (six) hours as needed for mild pain or moderate pain.   Yes [provider]  levofloxacin (LEVAQUIN) 500 MG tablet Take 1 tablet (500 mg total) by mouth daily for 7 days. 01/25/22 02/01/22 Yes Shah, Pratik D, DO  losartan (COZAAR) 25 MG tablet Take 25 mg by mouth daily. 03/03/21  Yes [provider]  meloxicam (MOBIC) 7.5 MG tablet Take 7.5 mg by mouth daily.   Yes [provider]  montelukast (SINGULAIR) 10 MG tablet Take 10 mg by mouth at bedtime. 08/02/19  Yes [provider]  tiotropium (SPIRIVA) 18 MCG inhalation capsule Place 1 capsule (18 mcg total) into inhaler and inhale daily. 10/06/20  Yes Fisher, Linden Dolin, PA-C  HYDROcodone-acetaminophen (NORCO/VICODIN) 5-325 MG tablet Take 2 tablets by mouth every 4 (four) hours as needed. Patient not taking: Reported on 01/30/2022 01/23/22   Teressa Lower, MD                                                                                                                                    Past Surgical History Past Surgical History:  Procedure Laterality Date   BILATERAL CARPAL TUNNEL RELEASE     CERVICAL ABLATION     Family History Family History  Problem Relation Age of Onset   Colon polyps Father    Breast cancer Sister    Colon polyps Sister    Breast cancer Paternal Grandmother    Colon cancer Neg Hx     Social History Social History   Tobacco Use   Smoking status: Former    Packs/day: 0.25    Types: Cigarettes    Quit date: 07/17/2019    Years since quitting: 2.5   Smokeless tobacco: Never  Vaping Use   Vaping Use: Former   Quit date: 06/26/2019  Substance Use Topics   Alcohol use: No   Drug use: Not Currently    Types: Marijuana   Allergies Amoxicillin, Prednisone, Doxycycline, Metoprolol, Other, and Umeclidinium  Review of Systems Review of Systems  Musculoskeletal:  Positive for arthralgias, joint swelling and myalgias.    Physical Exam Vital Signs  I have  reviewed the triage vital signs BP (!) 118/55 (BP Location: Right Arm)   Pulse 92   Temp (!) 97.5 F (36.4 C) (Oral)   Resp 20  SpO2 99%   Physical Exam Vitals and nursing note reviewed.  Constitutional:      General: She is not in acute distress.    Appearance: She is well-developed.  HENT:     Head: Normocephalic and atraumatic.  Eyes:     Conjunctiva/sclera: Conjunctivae normal.  Cardiovascular:     Rate and Rhythm: Normal rate and regular rhythm.     Heart sounds: No murmur heard. Pulmonary:     Effort: Pulmonary effort is normal. No respiratory distress.     Breath sounds: Normal breath sounds.  Abdominal:     Palpations: Abdomen is soft.     Tenderness: There is no abdominal tenderness.  Musculoskeletal:        General: Swelling and tenderness present.     Cervical back: Neck supple.  Skin:    General: Skin is warm and dry.     Capillary Refill: Capillary refill takes less than 2 seconds.  Neurological:     Mental Status: She is alert.  Psychiatric:        Mood and Affect: Mood normal.     ED Results and Treatments Labs (all labs ordered are listed, but only abnormal results are displayed) Labs Reviewed  CBC WITH DIFFERENTIAL/PLATELET - Abnormal; Notable for the following components:      Result Value   Hemoglobin 9.8 (*)    HCT 33.9 (*)    MCV 73.1 (*)    MCH 21.1 (*)    MCHC 28.9 (*)    All other components within normal limits  COMPREHENSIVE METABOLIC PANEL  SEDIMENTATION RATE  C-REACTIVE PROTEIN                                                                                                                          Radiology DG Hand Complete Left  Result Date: 01/30/2022 CLINICAL DATA:  Retained foreign body in the hand, cellulitis EXAM: LEFT HAND - COMPLETE 3+ VIEW COMPARISON:  01/23/2022 FINDINGS: Loss of articular space and spurring at the first carpometacarpal articulation compatible with degenerative arthropathy. No well-defined radiopaque  foreign body. No gas is observed in the soft tissues of the hand. No bony destructive findings. Dorsal soft tissue swelling along the hand is minimally improved from 01/23/2022. IMPRESSION: 1. Dorsal soft tissue swelling along the hand is minimally improved. 2. Degenerative arthropathy at the first carpometacarpal articulation. 3. No radiopaque foreign body or gas identified in the soft tissues. Electronically Signed   By: Van Clines M.D.   On: 01/30/2022 13:03    Pertinent labs & imaging results that were available during my care of the patient were reviewed by me and considered in my medical decision making (see MDM for details).  Medications Ordered in ED Medications  vancomycin (VANCOREADY) IVPB 2000 mg/400 mL ( Intravenous Infusion Verify 01/30/22 1410)  vancomycin (VANCOREADY) IVPB 1250 mg/250 mL (has no administration in time range)  metroNIDAZOLE (FLAGYL) IVPB 500 mg (has no administration in time range)  insulin aspart (novoLOG)  injection 0-15 Units (has no administration in time range)  insulin aspart (novoLOG) injection 0-5 Units (has no administration in time range)  aspirin chewable tablet 81 mg (has no administration in time range)  atorvastatin (LIPITOR) tablet 40 mg (has no administration in time range)  losartan (COZAAR) tablet 25 mg (has no administration in time range)  clopidogrel (PLAVIX) tablet 75 mg (has no administration in time range)  Ferrous Fumarate (HEMOCYTE - 106 mg FE) tablet 106 mg of iron (has no administration in time range)  tiotropium (SPIRIVA) inhalation capsule ([facility] use ONLY) 18 mcg (has no administration in time range)  sodium chloride flush (NS) 0.9 % injection 3 mL (has no administration in time range)  sodium chloride flush (NS) 0.9 % injection 3 mL (has no administration in time range)  sodium chloride flush (NS) 0.9 % injection 3 mL (has no administration in time range)  0.9 %  sodium chloride infusion (has no administration in time range)   acetaminophen (TYLENOL) tablet 650 mg (has no administration in time range)    Or  acetaminophen (TYLENOL) suppository 650 mg (has no administration in time range)  traZODone (DESYREL) tablet 50 mg (has no administration in time range)  polyethylene glycol (MIRALAX / GLYCOLAX) packet 17 g (has no administration in time range)  bisacodyl (DULCOLAX) suppository 10 mg (has no administration in time range)  ondansetron (ZOFRAN) tablet 4 mg (has no administration in time range)    Or  ondansetron (ZOFRAN) injection 4 mg (has no administration in time range)  heparin injection 5,000 Units (has no administration in time range)  hydrALAZINE (APRESOLINE) injection 10 mg (has no administration in time range)  albuterol (PROVENTIL) (2.5 MG/3ML) 0.083% nebulizer solution 2.5 mg (has no administration in time range)  ceFEPIme (MAXIPIME) 2 g in sodium chloride 0.9 % 100 mL IVPB (0 g Intravenous Paused 01/30/22 1321)  HYDROcodone-acetaminophen (NORCO/VICODIN) 5-325 MG per tablet 2 tablet (2 tablets Oral Given 01/30/22 1310)                                                                                                                                     Procedures Procedures  (including critical care time)  Medical Decision Making / ED Course   This patient presents to the ED for concern of hand pain, swelling, this involves an extensive number of treatment options, and is a complaint that carries with it a high risk of complications and morbidity.  The differential diagnosis includes cellulitis, retained foreign body, fracture, contact dermatitis  MDM: Patient seen emergency room for evaluation of left hand pain and swelling.  Physical exam with erythema, tenderness and swelling over the dorsum of the left hand.  Laboratory evaluation with no significant leukocytosis but hemoglobin of 9.8 which is baseline for the patient.  Chemistry unremarkable.  Sed rate 45.  X-ray unremarkable.  Given patient's  failure of multiple rounds antibiotics including Ancef, dalbavancin, patient was  started on broad-spectrum antibiotics and will require hospital admission for persistent cellulitis.   Additional history obtained:  -External records from outside source obtained and reviewed including: Chart review including previous notes, labs, imaging, consultation notes   Lab Tests: -I ordered, reviewed, and interpreted labs.   The pertinent results include:   Labs Reviewed  CBC WITH DIFFERENTIAL/PLATELET - Abnormal; Notable for the following components:      Result Value   Hemoglobin 9.8 (*)    HCT 33.9 (*)    MCV 73.1 (*)    MCH 21.1 (*)    MCHC 28.9 (*)    All other components within normal limits  COMPREHENSIVE METABOLIC PANEL  SEDIMENTATION RATE  C-REACTIVE PROTEIN        Imaging Studies ordered: I ordered imaging studies including x-ray hand I independently visualized and interpreted imaging. I agree with the radiologist interpretation   Medicines ordered and prescription drug management: Meds ordered this encounter  Medications   ceFEPIme (MAXIPIME) 2 g in sodium chloride 0.9 % 100 mL IVPB   vancomycin (VANCOREADY) IVPB 2000 mg/400 mL    Order Specific Question:   Indication:    Answer:   Cellulitis   vancomycin (VANCOREADY) IVPB 1250 mg/250 mL    Order Specific Question:   Indication:    Answer:   Cellulitis   HYDROcodone-acetaminophen (NORCO/VICODIN) 5-325 MG per tablet 2 tablet   metroNIDAZOLE (FLAGYL) IVPB 500 mg    Order Specific Question:   Antibiotic Indication:    Answer:   Other Indication (list below)    Order Specific Question:   Other Indication:    Answer:   cat bite   insulin aspart (novoLOG) injection 0-15 Units    Order Specific Question:   Correction coverage:    Answer:   Moderate (average weight, post-op)    Order Specific Question:   CBG < 70:    Answer:   Implement Hypoglycemia Standing Orders and refer to Hypoglycemia Standing Orders sidebar report     Order Specific Question:   CBG 70 - 120:    Answer:   0 units    Order Specific Question:   CBG 121 - 150:    Answer:   2 units    Order Specific Question:   CBG 151 - 200:    Answer:   3 units    Order Specific Question:   CBG 201 - 250:    Answer:   5 units    Order Specific Question:   CBG 251 - 300:    Answer:   8 units    Order Specific Question:   CBG 301 - 350:    Answer:   11 units    Order Specific Question:   CBG 351 - 400:    Answer:   15 units    Order Specific Question:   CBG > 400    Answer:   call MD and obtain STAT lab verification   insulin aspart (novoLOG) injection 0-5 Units    Order Specific Question:   Correction coverage:    Answer:   HS scale    Order Specific Question:   CBG < 70:    Answer:   Implement Hypoglycemia Standing Orders and refer to Hypoglycemia Standing Orders sidebar report    Order Specific Question:   CBG 70 - 120:    Answer:   0 units    Order Specific Question:   CBG 121 - 150:    Answer:   0 units  Order Specific Question:   CBG 151 - 200:    Answer:   0 units    Order Specific Question:   CBG 201 - 250:    Answer:   2 units    Order Specific Question:   CBG 251 - 300:    Answer:   3 units    Order Specific Question:   CBG 301 - 350:    Answer:   4 units    Order Specific Question:   CBG 351 - 400:    Answer:   5 units    Order Specific Question:   CBG > 400    Answer:   call MD and obtain STAT lab verification   DISCONTD: insulin aspart (novoLOG) injection 3 Units   aspirin chewable tablet 81 mg   atorvastatin (LIPITOR) tablet 40 mg   losartan (COZAAR) tablet 25 mg   clopidogrel (PLAVIX) tablet 75 mg   Ferrous Fumarate (HEMOCYTE - 106 mg FE) tablet 106 mg of iron   tiotropium (SPIRIVA) inhalation capsule ([facility] use ONLY) 18 mcg   sodium chloride flush (NS) 0.9 % injection 3 mL   sodium chloride flush (NS) 0.9 % injection 3 mL   sodium chloride flush (NS) 0.9 % injection 3 mL   0.9 %  sodium chloride infusion   OR Linked  Order Group    acetaminophen (TYLENOL) tablet 650 mg    acetaminophen (TYLENOL) suppository 650 mg   traZODone (DESYREL) tablet 50 mg   polyethylene glycol (MIRALAX / GLYCOLAX) packet 17 g   bisacodyl (DULCOLAX) suppository 10 mg   OR Linked Order Group    ondansetron (ZOFRAN) tablet 4 mg    ondansetron (ZOFRAN) injection 4 mg   heparin injection 5,000 Units   hydrALAZINE (APRESOLINE) injection 10 mg   albuterol (PROVENTIL) (2.5 MG/3ML) 0.083% nebulizer solution 2.5 mg    -I have reviewed the patients home medicines and have made adjustments as needed  Critical interventions none    Cardiac Monitoring: The patient was maintained on a cardiac monitor.  I personally viewed and interpreted the cardiac monitored which showed an underlying rhythm of: NSR  Social Determinants of Health:  Factors impacting patients care include: none   Reevaluation: After the interventions noted above, I reevaluated the patient and found that they have :stayed the same  Co morbidities that complicate the patient evaluation  Past Medical History:  Diagnosis Date   ADHD (attention deficit hyperactivity disorder)    Anxiety    Arthritis    Asthma    Bipolar disorder ([place])    Cancer ([place])    left lung cancer   CHF (congestive heart failure) (HCC)    COPD (chronic obstructive pulmonary disease) ([place])    Coronary artery disease    Depression    Dyspnea    GERD (gastroesophageal reflux disease)    Hypertension    On home oxygen therapy    3L O2 via [place] continuously   Peripheral vascular disease ([place])    Pneumonia    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

## 2022-01-30 NOTE — Progress Notes (Signed)
Pharmacy Antibiotic Note  VONA Stephanie Ross is a 56 y.o. female admitted on 01/30/2022 with cellulitis.  Pharmacy has been consulted for vancomycin dosing.  Plan: Vancomycin 2000 mg IV x 1 dose. Vancomycin 1250 mg IV every 24 hours. Monitor labs, c/s, and vanco level as indicated.     Temp (24hrs), Avg:98.2 F (36.8 C), Min:98.2 F (36.8 C), Max:98.2 F (36.8 C)  Recent Labs  Lab 01/23/22 1537 01/24/22 0423 01/25/22 0438  WBC  --  6.1 5.7  CREATININE  --  0.67 0.64  LATICACIDVEN 0.7  --   --     Estimated Creatinine Clearance: 85.5 mL/min (by C-G formula based on SCr of 0.64 mg/dL).    Allergies  Allergen Reactions   Amoxicillin Other (See Comments)    Has patient had a PCN reaction causing immediate rash, facial/tongue/throat swelling, SOB or lightheadedness with hypotension: Yes Has patient had a PCN reaction causing severe rash involving mucus membranes or skin necrosis: Yes Has patient had a PCN reaction that required hospitalization:yes Has patient had a PCN reaction occurring within the last 10 years: yes If all of the above answers are "NO", then may proceed with Cephalosporin use.    Prednisone Other (See Comments)    Full body rash    Doxycycline Other (See Comments)    Other reaction(s): Other (See Comments) "caused fluid in lungs" "caused fluid in lungs"    Metoprolol     Other reaction(s): Dizziness   Other Other (See Comments)    Tide detergent - rash    Antimicrobials this admission: Vanco 11/6 >> Cefepime 11/6   Microbiology results: None pending  Thank you for allowing pharmacy to be a part of this patient's care.  Margot Ables, PharmD Clinical Pharmacist 01/30/2022 12:52 PM

## 2022-01-31 DIAGNOSIS — L03114 Cellulitis of left upper limb: Secondary | ICD-10-CM | POA: Diagnosis not present

## 2022-01-31 DIAGNOSIS — J9611 Chronic respiratory failure with hypoxia: Secondary | ICD-10-CM | POA: Diagnosis not present

## 2022-01-31 DIAGNOSIS — E1169 Type 2 diabetes mellitus with other specified complication: Secondary | ICD-10-CM | POA: Diagnosis not present

## 2022-01-31 DIAGNOSIS — I10 Essential (primary) hypertension: Secondary | ICD-10-CM | POA: Diagnosis not present

## 2022-01-31 LAB — BASIC METABOLIC PANEL
Anion gap: 9 (ref 5–15)
BUN: 7 mg/dL (ref 6–20)
CO2: 31 mmol/L (ref 22–32)
Calcium: 8.7 mg/dL — ABNORMAL LOW (ref 8.9–10.3)
Chloride: 101 mmol/L (ref 98–111)
Creatinine, Ser: 0.6 mg/dL (ref 0.44–1.00)
GFR, Estimated: >60 mL/min (ref >60)
Glucose, Bld: 153 mg/dL — ABNORMAL HIGH (ref 70–99)
Potassium: 3.7 mmol/L (ref 3.5–5.1)
Sodium: 141 mmol/L (ref 135–145)

## 2022-01-31 LAB — CBC WITH DIFFERENTIAL/PLATELET
Abs Immature Granulocytes: 0.04 10*3/uL (ref 0.00–0.07)
Basophils Absolute: 0 10*3/uL (ref 0.0–0.1)
Basophils Relative: 0 %
Eosinophils Absolute: 0.1 10*3/uL (ref 0.0–0.5)
Eosinophils Relative: 3 %
HCT: 32.1 % — ABNORMAL LOW (ref 36.0–46.0)
Hemoglobin: 9.1 g/dL — ABNORMAL LOW (ref 12.0–15.0)
Immature Granulocytes: 1 %
Lymphocytes Relative: 21 %
Lymphs Abs: 1.2 10*3/uL (ref 0.7–4.0)
MCH: 21.1 pg — ABNORMAL LOW (ref 26.0–34.0)
MCHC: 28.3 g/dL — ABNORMAL LOW (ref 30.0–36.0)
MCV: 74.3 fL — ABNORMAL LOW (ref 80.0–100.0)
Monocytes Absolute: 0.6 10*3/uL (ref 0.1–1.0)
Monocytes Relative: 11 %
Neutro Abs: 3.6 10*3/uL (ref 1.7–7.7)
Neutrophils Relative %: 64 %
Platelets: 307 10*3/uL (ref 150–400)
RBC: 4.32 MIL/uL (ref 3.87–5.11)
WBC: 5.5 10*3/uL (ref 4.0–10.5)
nRBC: 0 % (ref 0.0–0.2)

## 2022-01-31 LAB — GLUCOSE, CAPILLARY
Glucose-Capillary: 122 mg/dL — ABNORMAL HIGH (ref 70–99)
Glucose-Capillary: 136 mg/dL — ABNORMAL HIGH (ref 70–99)

## 2022-01-31 MED ORDER — VANCOMYCIN HCL 1250 MG/250ML IV SOLN
1250.0000 mg | Freq: Once | INTRAVENOUS | Status: AC
  Administered 2022-01-31: 1250 mg via INTRAVENOUS
  Filled 2022-01-31: qty 250

## 2022-01-31 MED ORDER — LEVOFLOXACIN 500 MG PO TABS: 500.0000 mg | ORAL_TABLET | Freq: Every day | ORAL | 0 refills | Status: DC

## 2022-01-31 MED ORDER — METRONIDAZOLE 500 MG PO TABS
500.0000 mg | ORAL_TABLET | Freq: Once | ORAL | Status: AC
  Administered 2022-01-31: 500 mg via ORAL
  Filled 2022-01-31: qty 1

## 2022-01-31 MED ORDER — INCRUSE ELLIPTA 62.5 MCG/ACT IN AEPB: 1.0000 | INHALATION_SPRAY | Freq: Every day | RESPIRATORY_TRACT | 2 refills | Status: AC

## 2022-01-31 MED ORDER — ASPIRIN 81 MG PO TBEC: 81.0000 mg | DELAYED_RELEASE_TABLET | Freq: Every day | ORAL | 2 refills | Status: DC

## 2022-01-31 MED ORDER — METRONIDAZOLE 500 MG PO TABS: 500.0000 mg | ORAL_TABLET | Freq: Three times a day (TID) | ORAL | 0 refills | Status: AC

## 2022-01-31 MED ORDER — HYDROCODONE-ACETAMINOPHEN 5-325 MG PO TABS: 1.0000 | ORAL_TABLET | ORAL | 0 refills | Status: DC | PRN

## 2022-01-31 MED ORDER — ALBUTEROL SULFATE HFA 108 (90 BASE) MCG/ACT IN AERS: 2.0000 | INHALATION_SPRAY | RESPIRATORY_TRACT | 3 refills | Status: AC | PRN

## 2022-01-31 NOTE — TOC Initial Note (Signed)
Transition of Care [facility]) - Initial/Assessment Note    Patient Details  Name: TYSHIA FENTER MRN: [id number] Date of Birth: [birth year]  [facility] CM/SW Contact:    Joaquin Courts, RN Phone Number: 01/31/2022, 8:58 AM       Transition of Care Department [facility]) has reviewed patient and no TOC needs have been identified at this time. We will continue to monitor patient advancement through interdisciplinary progression rounds. If new patient transition needs arise, please place a TOC consult.

## 2022-01-31 NOTE — Discharge Summary (Signed)
Stephanie Ross, is a 56 y.o. female  DOB 1966-03-06  MRN [id number].  Admission date:  01/30/2022  Admitting Physician  Shaily Librizzi Denton Brick, MD  Discharge Date:  01/31/2022   Primary MD  [facility]  Recommendations for primary care physician for things to follow:   1)You are taking Aspirin and Plavix please Avoid ibuprofen/Advil/Aleve/Motrin/Goody Powders/Naproxen/BC powders/Meloxicam/Diclofenac/Indomethacin and other Nonsteroidal anti-inflammatory medications as these will make you more likely to bleed and can cause stomach ulcers, can also cause Kidney problems.   2)Please Take Levofloxacin and Metronidazole as prescribed for Infected cat scratch/bite of the left hand  3)Repeat CBC and BMP blood test with a primary care physician in 1 week or so  Admission Diagnosis  Cellulitis [L03.90] Cellulitis, unspecified cellulitis site [L03.90]   Discharge Diagnosis  Cellulitis [L03.90] Cellulitis, unspecified cellulitis site [L03.90]    Principal Problem:   Lt Hand Cellulitis Active Problems:   Diabetes mellitus type 2 in obese [facility])   Essential hypertension   Chronic respiratory failure with hypoxia (HCC)      Past Medical History:  Diagnosis Date   ADHD (attention deficit hyperactivity disorder)    Anxiety    Arthritis    Asthma    Bipolar disorder ([place])    Cancer ([place])    left lung cancer   CHF (congestive heart failure) (HCC)    COPD (chronic obstructive pulmonary disease) (HCC)    Coronary artery disease    Depression    Dyspnea    GERD (gastroesophageal reflux disease)    Hypertension    On home oxygen therapy    3L O2 via [place] continuously   Peripheral vascular disease ([place])    Pneumonia    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 female  with medical history significant for morbid obesity, COPD,  chronic respiratory failure on 3 L, bipolar disorder, ADHD, coronary artery disease, hypertension, history of prior stroke who was discharged on 01/25/2022 after treatment for left hand cellulitis and was discharged on oral Levaquin, patient said left hand cellulitis improved significantly .  However over the last 24 hours she noticed increased swelling pain redness and warmth of the left hand again -Initial bout of left hand cellulitis back in October was apparently secondary to cat scratch/cat bite injury that got infected---since then patient has been treated with Cefadroxil, Dalbavancin, IV vancomycin, IV Rocephin, Ancef, and Levaquin -No fever  Or chills  -Denies repeat injury In ED--- Lt Hand xray shows Dorsal soft tissue swelling along the hand is minimally improved. -No radiopaque foreign body or gas identified in the soft tissues. -WBC 8.7 -HGB 9.8 which is close to patient's baseline CMP essentially WNL -ESR 45 it  was 47 a week ago -CRP 4.3 it was 7.4 a week ago     Hospital Course:     No notes on file  ***** Assessment and Plan: No notes have been filed under this hospital service. Service: Hospitalist       Discharge Condition: ***  Follow UP   Follow-up Information     [facility] Follow up in 1 week(s).   Why: Repeat CBC and BMP Blood Tests Contact information: [address] [place] [address] [phone]                  Consults obtained - ***  Diet and Activity recommendation:  As advised  Discharge Instructions    **** Discharge Instructions     Call MD for:  difficulty breathing, headache or visual disturbances   Complete by: As directed    Call MD for:  persistant dizziness or light-headedness   Complete by: As directed    Call MD  for:  persistant nausea and vomiting   Complete by: As directed    Call MD for:  temperature >100.4   Complete by: As directed    Diet - low sodium heart healthy   Complete by: As directed    Discharge instructions   Complete by: As directed    1)You are taking Aspirin and Plavix please Avoid ibuprofen/Advil/Aleve/Motrin/Goody Powders/Naproxen/BC powders/Meloxicam/Diclofenac/Indomethacin and other Nonsteroidal anti-inflammatory medications as these will make you more likely to bleed and can cause stomach ulcers, can also cause Kidney problems.   2)Please Take Levofloxacin and Metronidazole as prescribed for Infected cat scratch/bite of the left hand  3)Repeat CBC and BMP blood test with a primary care physician in 1 week or so   Increase activity slowly   Complete by: As directed          Discharge Medications     Allergies as of 01/31/2022       Reactions   Amoxicillin Other (See Comments)   Has patient had a PCN reaction causing immediate rash, facial/tongue/throat swelling, SOB or lightheadedness with hypotension: Yes Has patient had a PCN reaction causing severe rash involving mucus membranes or skin necrosis: Yes Has patient had a PCN reaction that required hospitalization:yes Has patient had a PCN reaction occurring within the last 10 years: yes If all of the above answers are "NO", then may proceed with Cephalosporin use.   Prednisone Other (See Comments)   Full body rash    Doxycycline Other (See Comments)   Other reaction(s): Other (See Comments) "caused fluid in lungs" "caused fluid in lungs"   Metoprolol    Other reaction(s): Dizziness   Other Other (See Comments)   Tide detergent - rash   Umeclidinium Other (See Comments)   "Fluid on lungs"        Medication List     STOP taking these medications    aspirin 81 MG chewable tablet Replaced by: aspirin EC 81 MG tablet   ibuprofen 200 MG tablet Commonly known as: ADVIL   meloxicam 7.5 MG  tablet Commonly known as: MOBIC   tiotropium 18 MCG inhalation capsule Commonly known as: SPIRIVA Replaced by: Incruse Ellipta 62.5 MCG/ACT Aepb       TAKE these medications    albuterol (2.5 MG/3ML) 0.083% nebulizer solution Commonly known as: PROVENTIL Take 2.5 mg by nebulization every 6 (six) hours as needed for wheezing or shortness of breath.   albuterol 108 (90 Base) MCG/ACT inhaler Commonly known as: VENTOLIN HFA  Inhale 2 puffs into the lungs every 4 (four) hours as needed for wheezing or shortness of breath.   aspirin EC 81 MG tablet Take 1 tablet (81 mg total) by mouth daily with breakfast. Replaces: aspirin 81 MG chewable tablet   atorvastatin 40 MG tablet Commonly known as: LIPITOR Take 40 mg by mouth daily.   baclofen 10 MG tablet Commonly known as: LIORESAL Take 10 mg by mouth 3 (three) times daily as needed for muscle spasms.   budesonide-formoterol 160-4.5 MCG/ACT inhaler Commonly known as: SYMBICORT Inhale 2 puffs into the lungs 2 (two) times daily.   cetirizine 10 MG tablet Commonly known as: ZYRTEC Take 10 mg by mouth daily.   clopidogrel 75 MG tablet Commonly known as: PLAVIX Take 75 mg by mouth daily.   CoQ10 100 MG Caps Take 100 mg by mouth daily.   Daliresp 500 MCG Tabs tablet Generic drug: roflumilast Take 500 mcg by mouth daily.   ergocalciferol 1.25 MG (50000 UT) capsule Commonly known as: VITAMIN D2 Take 50,000 Units by mouth every Monday.   glimepiride 2 MG tablet Commonly known as: AMARYL Take 2 mg by mouth every morning.   HYDROcodone-acetaminophen 5-325 MG tablet Commonly known as: Norco Take 1 tablet by mouth every 4 (four) hours as needed for moderate pain. What changed:  how much to take reasons to take this   Incruse Ellipta 62.5 MCG/ACT Aepb Generic drug: umeclidinium bromide Inhale 1 puff into the lungs daily. Replaces: tiotropium 18 MCG inhalation capsule   IRON PO Take 1 tablet by mouth in the morning and at  bedtime.   levofloxacin 500 MG tablet Commonly known as: Levaquin Take 1 tablet (500 mg total) by mouth daily for 7 days. For Infected cat scratch/bite What changed: additional instructions   losartan 25 MG tablet Commonly known as: COZAAR Take 25 mg by mouth daily.   metroNIDAZOLE 500 MG tablet Commonly known as: FLAGYL Take 1 tablet (500 mg total) by mouth 3 (three) times daily for 5 days. For Infected cat scratch/bite   montelukast 10 MG tablet Commonly known as: SINGULAIR Take 10 mg by mouth at bedtime.        Major procedures and Radiology Reports - PLEASE review detailed and final reports for all details, in brief -   ***  DG Hand Complete Left  Result Date: 01/30/2022 CLINICAL DATA:  Retained foreign body in the hand, cellulitis EXAM: LEFT HAND - COMPLETE 3+ VIEW COMPARISON:  01/23/2022 FINDINGS: Loss of articular space and spurring at the first carpometacarpal articulation compatible with degenerative arthropathy. No well-defined radiopaque foreign body. No gas is observed in the soft tissues of the hand. No bony destructive findings. Dorsal soft tissue swelling along the hand is minimally improved from 01/23/2022. IMPRESSION: 1. Dorsal soft tissue swelling along the hand is minimally improved. 2. Degenerative arthropathy at the first carpometacarpal articulation. 3. No radiopaque foreign body or gas identified in the soft tissues. Electronically Signed   By: Van Clines M.D.   On: 01/30/2022 13:03   DG Hand 2 View Left  Result Date: 01/23/2022 CLINICAL DATA:  Cellulitis to left hand with swelling redness and pain. EXAM: LEFT HAND - 2 VIEW COMPARISON:  none available FINDINGS: AP and lateral views demonstrate degenerative changes of the base of the thumb. No acute fracture or dislocation. No osseous destruction. No soft tissue gas. There may be dorsal soft tissue swelling about the carpal and metacarpal bones on the lateral view. No radiopaque foreign object.  IMPRESSION: No  acute osseous abnormality. Electronically Signed   By: Abigail Miyamoto M.D.   On: 01/23/2022 17:51   DG Hand Complete Right  Result Date: 01/03/2022 CLINICAL DATA:  dorsal hand STS/redness EXAM: RIGHT HAND - COMPLETE 3+ VIEW COMPARISON:  None Available. FINDINGS: No cortical erosion or destruction. There is no evidence of fracture or dislocation. First carpometacarpal joint moderate degenerative changes. Soft tissues are unremarkable. No retained radiopaque foreign body. IMPRESSION: 1. No radiographic finding of osteomyelitis. 2.  No acute displaced fracture or dislocation. Electronically Signed   By: Iven Finn M.D.   On: 01/03/2022 16:08    Micro Results   *** Recent Results (from the past 240 hour(s))  Culture, blood (single)     Status: None   Collection Time: 01/22/22  7:53 PM   Specimen: BLOOD LEFT ARM  Result Value Ref Range Status   Specimen Description BLOOD LEFT ARM  Final   Special Requests   Final    BOTTLES DRAWN AEROBIC AND ANAEROBIC Blood Culture adequate volume   Culture   Final    NO GROWTH 5 DAYS Performed at [facility], [address], [place], [place] [address]    Report Status 01/27/2022 FINAL  Final  Blood Culture (routine x 2)     Status: None   Collection Time: 01/23/22  1:28 PM   Specimen: BLOOD LEFT ARM  Result Value Ref Range Status   Specimen Description BLOOD LEFT ARM  Final   Special Requests   Final    BOTTLES DRAWN AEROBIC AND ANAEROBIC Blood Culture results may not be optimal due to an excessive volume of blood received in culture bottles   Culture   Final    NO GROWTH 5 DAYS Performed at [facility], [address], [place], [place] [address]    Report Status 01/28/2022 FINAL  Final  Blood Culture (routine x 2)     Status: None   Collection Time: 01/23/22  1:28 PM   Specimen: Right Antecubital; Blood  Result Value Ref Range Status   Specimen Description RIGHT ANTECUBITAL  Final   Special Requests   Final    BOTTLES DRAWN  AEROBIC AND ANAEROBIC Blood Culture results may not be optimal due to an excessive volume of blood received in culture bottles   Culture   Final    NO GROWTH 5 DAYS Performed at [facility], [address], [place], [place] [address]    Report Status 01/28/2022 FINAL  Final    Today   Subjective    Oretha Milch today has no ***          Patient has been seen and examined prior to discharge   Objective   Blood pressure 133/68, pulse 86, temperature 98 F (36.7 C), temperature source Oral, resp. rate 18, height _0  (1.575 m), weight 99.3 kg, SpO2 96 %.   Intake/Output Summary (Last 24 hours) at 01/31/2022 1155 Last data filed at 01/31/2022 0900 Gross per 24 hour  Intake 1898.74 ml  Output --  Net 1898.74 ml    Exam Gen:- Awake Alert, no acute distress *** HEENT:- [place].AT, No sclera icterus Neck-Supple Neck,No JVD,.  Lungs-  CTAB , good air movement bilaterally CV- S1, S2 normal, regular Abd-  +ve B.Sounds, Abd Soft, No tenderness,    Psych-affect is appropriate, oriented x3 Neuro-no new focal deficits, no tremors -Extremity/Skin:- good pulses, improving left hand erythema , warmth, tenderness and swelling   Media Information  Document Information  Photos  Improved Lt Hand cellulitis  01/31/2022 09:45  Attached To:  Hospital Encounter on 01/30/22  Source Information  Roxan Hockey, MD  Ap-Dept 300     Data Review   CBC w Diff:  Lab Results  Component Value Date   WBC 5.5 01/31/2022   HGB 9.1 (L) 01/31/2022   HCT 32.1 (L) 01/31/2022   PLT 307 01/31/2022   LYMPHOPCT 21 01/31/2022   MONOPCT 11 01/31/2022   EOSPCT 3 01/31/2022   BASOPCT 0 01/31/2022    CMP:  Lab Results  Component Value Date   NA 141 01/31/2022   K 3.7 01/31/2022   CL 101 01/31/2022   CO2 31 01/31/2022   BUN 7 01/31/2022   CREATININE 0.60 01/31/2022   PROT 7.4 01/30/2022   ALBUMIN 3.6 01/30/2022   BILITOT 0.7 01/30/2022   ALKPHOS 88 01/30/2022   AST 18 01/30/2022    ALT 20 01/30/2022  .  Total Discharge time is about 33 minutes  Roxan Hockey M.D on 01/31/2022 at 11:55 AM  Go to [email or web address] -  for contact info  [facility] - Office  [phone]

## 2022-01-31 NOTE — Care Management Obs Status (Signed)
[place] NOTIFICATION   Patient Details  Name: Stephanie Ross MRN: [id number] Date of Birth: 12/16/1965   Medicare Observation Status Notification Given:  Yes    Tommy Medal 01/31/2022, 12:04 PM

## 2022-01-31 NOTE — Progress Notes (Signed)
Vital signs stable. Medicated for pain x2 with relief. Trazadone given for sleep. Left hand elevated on 2 pillows. Redness and swelling appears to have gone down some since beginning of shift.

## 2022-01-31 NOTE — Plan of Care (Signed)
Problem: Education: Goal: Ability to describe self-care measures that may prevent or decrease complications (Diabetes Survival Skills Education) will improve Outcome: Progressing Goal: Individualized Educational Video(s) Outcome: Progressing   Problem: Coping: Goal: Ability to adjust to condition or change in health will improve Outcome: Progressing   Problem: Fluid Volume: Goal: Ability to maintain a balanced intake and output will improve Outcome: Progressing   Problem: Health Behavior/Discharge Planning: Goal: Ability to identify and utilize available resources and services will improve Outcome: Progressing Goal: Ability to manage health-related needs will improve Outcome: Progressing   Problem: Metabolic: Goal: Ability to maintain appropriate glucose levels will improve Outcome: Progressing   Problem: Nutritional: Goal: Maintenance of adequate nutrition will improve Outcome: Progressing Goal: Progress toward achieving an optimal weight will improve Outcome: Progressing   Problem: Skin Integrity: Goal: Risk for impaired skin integrity will decrease Outcome: Progressing   Problem: Tissue Perfusion: Goal: Adequacy of tissue perfusion will improve Outcome: Progressing   Problem: Education: Goal: Knowledge of General Education information will improve Description: Including pain rating scale, medication(s)/side effects and non-pharmacologic comfort measures Outcome: Progressing   Problem: Health Behavior/Discharge Planning: Goal: Ability to manage health-related needs will improve Outcome: Progressing   Problem: Clinical Measurements: Goal: Ability to maintain clinical measurements within normal limits will improve Outcome: Progressing Goal: Will remain free from infection Outcome: Progressing Goal: Diagnostic test results will improve Outcome: Progressing Goal: Respiratory complications will improve Outcome: Progressing Goal: Cardiovascular complication will  be avoided Outcome: Progressing   Problem: Activity: Goal: Risk for activity intolerance will decrease Outcome: Progressing   Problem: Nutrition: Goal: Adequate nutrition will be maintained Outcome: Progressing   Problem: Coping: Goal: Level of anxiety will decrease Outcome: Progressing   Problem: Elimination: Goal: Will not experience complications related to bowel motility Outcome: Progressing Goal: Will not experience complications related to urinary retention Outcome: Progressing   Problem: Pain Managment: Goal: General experience of comfort will improve Outcome: Progressing   Problem: Safety: Goal: Ability to remain free from injury will improve Outcome: Progressing   Problem: Skin Integrity: Goal: Risk for impaired skin integrity will decrease Outcome: Progressing   Problem: Education: Goal: Knowledge of General Education information will improve Description: Including pain rating scale, medication(s)/side effects and non-pharmacologic comfort measures Outcome: Progressing   Problem: Health Behavior/Discharge Planning: Goal: Ability to manage health-related needs will improve Outcome: Progressing   Problem: Clinical Measurements: Goal: Ability to maintain clinical measurements within normal limits will improve Outcome: Progressing Goal: Will remain free from infection Outcome: Progressing Goal: Diagnostic test results will improve Outcome: Progressing Goal: Respiratory complications will improve Outcome: Progressing Goal: Cardiovascular complication will be avoided Outcome: Progressing   Problem: Activity: Goal: Risk for activity intolerance will decrease Outcome: Progressing   Problem: Education: Goal: Ability to describe self-care measures that may prevent or decrease complications (Diabetes Survival Skills Education) will improve Outcome: Progressing Goal: Individualized Educational Video(s) Outcome: Progressing   Problem: Coping: Goal:  Ability to adjust to condition or change in health will improve Outcome: Progressing   Problem: Fluid Volume: Goal: Ability to maintain a balanced intake and output will improve Outcome: Progressing   Problem: Health Behavior/Discharge Planning: Goal: Ability to identify and utilize available resources and services will improve Outcome: Progressing Goal: Ability to manage health-related needs will improve Outcome: Progressing   Problem: Metabolic: Goal: Ability to maintain appropriate glucose levels will improve Outcome: Progressing   Problem: Nutritional: Goal: Maintenance of adequate nutrition will improve Outcome: Progressing Goal: Progress toward achieving an optimal weight will improve Outcome: Progressing  Problem: Skin Integrity: Goal: Risk for impaired skin integrity will decrease Outcome: Progressing   Problem: Tissue Perfusion: Goal: Adequacy of tissue perfusion will improve Outcome: Progressing

## 2022-01-31 NOTE — Discharge Instructions (Signed)
1)You are taking Aspirin and Plavix please Avoid ibuprofen/Advil/Aleve/Motrin/Goody Powders/Naproxen/BC powders/Meloxicam/Diclofenac/Indomethacin and other Nonsteroidal anti-inflammatory medications as these will make you more likely to bleed and can cause stomach ulcers, can also cause Kidney problems.   2)Please Take Levofloxacin and Metronidazole as prescribed for Infected cat scratch/bite of the left hand  3)Repeat CBC and BMP blood test with a primary care physician in 1 week or so

## 2022-02-07 ENCOUNTER — Telehealth: Payer: Self-pay | Admitting: *Deleted

## 2022-02-07 ENCOUNTER — Inpatient Hospital Stay (HOSPITAL_BASED_OUTPATIENT_CLINIC_OR_DEPARTMENT_OTHER): Payer: Medicare Other | Admitting: Oncology

## 2022-02-07 ENCOUNTER — Inpatient Hospital Stay: Payer: Medicare Other | Attending: Oncology

## 2022-02-07 VITALS — BP 105/66 | HR 86 | Temp 96.0°F | Resp 18 | Wt 212.0 lb

## 2022-02-07 DIAGNOSIS — D509 Iron deficiency anemia, unspecified: Secondary | ICD-10-CM | POA: Insufficient documentation

## 2022-02-07 DIAGNOSIS — D500 Iron deficiency anemia secondary to blood loss (chronic): Secondary | ICD-10-CM | POA: Diagnosis not present

## 2022-02-07 DIAGNOSIS — R195 Other fecal abnormalities: Secondary | ICD-10-CM

## 2022-02-07 DIAGNOSIS — R918 Other nonspecific abnormal finding of lung field: Secondary | ICD-10-CM | POA: Diagnosis not present

## 2022-02-07 DIAGNOSIS — R935 Abnormal findings on diagnostic imaging of other abdominal regions, including retroperitoneum: Secondary | ICD-10-CM

## 2022-02-07 DIAGNOSIS — R1084 Generalized abdominal pain: Secondary | ICD-10-CM

## 2022-02-07 LAB — CBC WITH DIFFERENTIAL/PLATELET
Abs Immature Granulocytes: 0.04 10*3/uL (ref 0.00–0.07)
Basophils Absolute: 0 10*3/uL (ref 0.0–0.1)
Basophils Relative: 0 %
Eosinophils Absolute: 0.1 10*3/uL (ref 0.0–0.5)
Eosinophils Relative: 2 %
HCT: 37.7 % (ref 36.0–46.0)
Hemoglobin: 11.1 g/dL — ABNORMAL LOW (ref 12.0–15.0)
Immature Granulocytes: 1 %
Lymphocytes Relative: 18 %
Lymphs Abs: 1.4 10*3/uL (ref 0.7–4.0)
MCH: 21.9 pg — ABNORMAL LOW (ref 26.0–34.0)
MCHC: 29.4 g/dL — ABNORMAL LOW (ref 30.0–36.0)
MCV: 74.5 fL — ABNORMAL LOW (ref 80.0–100.0)
Monocytes Absolute: 0.6 10*3/uL (ref 0.1–1.0)
Monocytes Relative: 8 %
Neutro Abs: 5.8 10*3/uL (ref 1.7–7.7)
Neutrophils Relative %: 71 %
Platelets: 311 10*3/uL (ref 150–400)
RBC: 5.06 MIL/uL (ref 3.87–5.11)
RDW: 30.5 % — ABNORMAL HIGH (ref 11.5–15.5)
WBC: 8 10*3/uL (ref 4.0–10.5)
nRBC: 0 % (ref 0.0–0.2)

## 2022-02-07 LAB — IRON AND TIBC
Iron: 276 ug/dL — ABNORMAL HIGH (ref 28–170)
Saturation Ratios: 68 % — ABNORMAL HIGH (ref 10.4–31.8)
TIBC: 409 ug/dL (ref 250–450)
UIBC: 133 ug/dL

## 2022-02-07 LAB — FERRITIN: Ferritin: 20 ng/mL (ref 11–307)

## 2022-02-07 LAB — SAMPLE TO BLOOD BANK

## 2022-02-07 NOTE — Telephone Encounter (Signed)
Called pt with her ferritin results of 20. Improved from her last count of 8. Instructed her to continue taking her oral iron tablets. Informed her of her CT scan and Follow up with MD on 12/28. I will add labs to that appointment date. We will see what treatments needed from there.

## 2022-02-07 NOTE — Progress Notes (Signed)
Pt feels weak. Dyspnea is worse as hgb drops. Does complain of dizziness. In a wheelchair. On oxygen at 3 liters. Appetite is fair. No visible signs of bleeding.

## 2022-02-07 NOTE — Progress Notes (Signed)
[place]  [phone] [phone]  ID: KATELEEN ENCARNACION OB: June 13, 1965  MR#: [id number]  XIP#:[id number]  Patient Care Team: [facility] as PCP - General Telford Nab, [place] as Oncology Nurse Navigator  CHIEF COMPLAINT: Iron deficiency anemia.  INTERVAL HISTORY: Patient returns to clinic today for repeat laboratory work and further evaluation.  She feels significantly improved, but still has mild but chronic weakness and fatigue.  She has chronic shortness of breath and requires oxygen 24 hours a day.  She has no neurologic complaints.  She denies any recent fevers.  She has a fair appetite, but denies weight loss.  She denies any chest pain or hemoptysis.  She has no nausea, vomiting, constipation, or diarrhea.  She denies any melena or hematochezia.  She has no urinary complaints.  Patient offers no further complaints today.  REVIEW OF SYSTEMS:   Review of Systems  Constitutional:  Positive for malaise/fatigue. Negative for fever and weight loss.  Respiratory:  Positive for shortness of breath. Negative for cough and hemoptysis.   Cardiovascular: Negative.  Negative for chest pain and leg swelling.  Gastrointestinal: Negative.  Negative for abdominal pain, blood in stool and melena.  Genitourinary: Negative.  Negative for dysuria.  Musculoskeletal: Negative.  Negative for back pain.  Skin: Negative.  Negative for rash.  Neurological:  Positive for weakness. Negative for dizziness, focal weakness and headaches.  Psychiatric/Behavioral: Negative.  The patient is not nervous/anxious.     As per HPI. Otherwise, a complete review of systems is negative.  PAST MEDICAL HISTORY: Past Medical History:  Diagnosis Date   ADHD (attention deficit hyperactivity disorder)    Anxiety    Arthritis    Asthma    Bipolar disorder ([place])    Cancer (HCC)    left lung cancer   CHF (congestive heart failure) (HCC)    COPD (chronic  obstructive pulmonary disease) (HCC)    Coronary artery disease    Depression    Dyspnea    GERD (gastroesophageal reflux disease)    Hypertension    On home oxygen therapy    3L O2 via [place] continuously   Peripheral vascular disease ([place])    Pneumonia    Stroke ([place])    x 2 with left sided sensation deficits    PAST SURGICAL HISTORY: Past Surgical History:  Procedure Laterality Date   BILATERAL CARPAL TUNNEL RELEASE     CERVICAL ABLATION      FAMILY HISTORY: Family History  Problem Relation Age of Onset   Colon polyps Father    Breast cancer Sister    Colon polyps Sister    Breast cancer Paternal Grandmother    Colon cancer Neg Hx     ADVANCED DIRECTIVES (Y/N):  N  HEALTH MAINTENANCE: Social History   Tobacco Use   Smoking status: Former    Packs/day: 0.25    Types: Cigarettes    Quit date: 07/17/2019    Years since quitting: 2.5   Smokeless tobacco: Never  Vaping Use   Vaping Use: Former   Quit date: 06/26/2019  Substance Use Topics   Alcohol use: No   Drug use: Not Currently    Types: Marijuana     Colonoscopy:  PAP:  Bone density:  Lipid panel:  Allergies  Allergen Reactions   Amoxicillin Other (See Comments)    Has patient had a PCN reaction causing immediate rash, facial/tongue/throat swelling, SOB or lightheadedness with hypotension: Yes Has patient had a PCN  reaction causing severe rash involving mucus membranes or skin necrosis: Yes Has patient had a PCN reaction that required hospitalization:yes Has patient had a PCN reaction occurring within the last 10 years: yes If all of the above answers are "NO", then may proceed with Cephalosporin use.    Prednisone Other (See Comments)    Full body rash    Doxycycline Other (See Comments)    Other reaction(s): Other (See Comments) "caused fluid in lungs" "caused fluid in lungs"    Metoprolol     Other reaction(s): Dizziness   Other Other (See Comments)    Tide detergent - rash   Umeclidinium  Other (See Comments)    "Fluid on lungs"    Current Outpatient Medications  Medication Sig Dispense Refill   albuterol (PROVENTIL) (2.5 MG/3ML) 0.083% nebulizer solution Take 2.5 mg by nebulization every 6 (six) hours as needed for wheezing or shortness of breath.     albuterol (VENTOLIN HFA) 108 (90 Base) MCG/ACT inhaler Inhale 2 puffs into the lungs every 4 (four) hours as needed for wheezing or shortness of breath. 18 g 3   atorvastatin (LIPITOR) 40 MG tablet Take 40 mg by mouth daily.     budesonide-formoterol (SYMBICORT) 160-4.5 MCG/ACT inhaler Inhale 2 puffs into the lungs 2 (two) times daily.     cetirizine (ZYRTEC) 10 MG tablet Take 10 mg by mouth daily.     clopidogrel (PLAVIX) 75 MG tablet Take 75 mg by mouth daily.     Coenzyme Q10 (COQ10) 100 MG CAPS Take 100 mg by mouth daily.      DALIRESP 500 MCG TABS tablet Take 500 mcg by mouth daily.     ergocalciferol (VITAMIN D2) 1.25 MG (50000 UT) capsule Take 50,000 Units by mouth every Monday.     Ferrous Sulfate (IRON PO) Take 1 tablet by mouth in the morning and at bedtime.     glimepiride (AMARYL) 2 MG tablet Take 2 mg by mouth every morning.     losartan (COZAAR) 25 MG tablet Take 25 mg by mouth daily.     montelukast (SINGULAIR) 10 MG tablet Take 10 mg by mouth at bedtime.     umeclidinium bromide (INCRUSE ELLIPTA) 62.5 MCG/ACT AEPB Inhale 1 puff into the lungs daily. 30 each 2   baclofen (LIORESAL) 10 MG tablet Take 10 mg by mouth 3 (three) times daily as needed for muscle spasms. (Patient not taking: Reported on 02/07/2022)     No current facility-administered medications for this visit.    OBJECTIVE: Vitals:   02/07/22 0944  BP: 105/66  Pulse: 86  Resp: 18  Temp: (!) 96 F (35.6 C)  SpO2: 99%     Body mass index is 38.78 kg/m.    ECOG FS:1 - Symptomatic but completely ambulatory  General: Well-developed, well-nourished, no acute distress.  Sitting in a wheelchair. Eyes: Pink conjunctiva, anicteric sclera. HEENT:  Normocephalic, moist mucous membranes. Lungs: No audible wheezing or coughing. Heart: Regular rate and rhythm. Abdomen: Soft, nontender, no obvious distention. Musculoskeletal: No edema, cyanosis, or clubbing. Neuro: Alert, answering all questions appropriately. Cranial nerves grossly intact. Skin: No rashes or petechiae noted. Psych: Normal affect.   LAB RESULTS:  Lab Results  Component Value Date   NA 141 01/31/2022   K 3.7 01/31/2022   CL 101 01/31/2022   CO2 31 01/31/2022   GLUCOSE 153 (H) 01/31/2022   BUN 7 01/31/2022   CREATININE 0.60 01/31/2022   CALCIUM 8.7 (L) 01/31/2022   PROT 7.4 01/30/2022  ALBUMIN 3.6 01/30/2022   AST 18 01/30/2022   ALT 20 01/30/2022   ALKPHOS 88 01/30/2022   BILITOT 0.7 01/30/2022   GFRNONAA >60 01/31/2022   GFRAA >60 10/15/2019    Lab Results  Component Value Date   WBC 8.0 02/07/2022   NEUTROABS 5.8 02/07/2022   HGB 11.1 (L) 02/07/2022   HCT 37.7 02/07/2022   MCV 74.5 (L) 02/07/2022   PLT 311 02/07/2022     STUDIES: DG Hand Complete Left  Result Date: 01/30/2022 CLINICAL DATA:  Retained foreign body in the hand, cellulitis EXAM: LEFT HAND - COMPLETE 3+ VIEW COMPARISON:  01/23/2022 FINDINGS: Loss of articular space and spurring at the first carpometacarpal articulation compatible with degenerative arthropathy. No well-defined radiopaque foreign body. No gas is observed in the soft tissues of the hand. No bony destructive findings. Dorsal soft tissue swelling along the hand is minimally improved from 01/23/2022. IMPRESSION: 1. Dorsal soft tissue swelling along the hand is minimally improved. 2. Degenerative arthropathy at the first carpometacarpal articulation. 3. No radiopaque foreign body or gas identified in the soft tissues. Electronically Signed   By: Van Clines M.D.   On: 01/30/2022 13:03   DG Hand 2 View Left  Result Date: 01/23/2022 CLINICAL DATA:  Cellulitis to left hand with swelling redness and pain. EXAM: LEFT  HAND - 2 VIEW COMPARISON:  none available FINDINGS: AP and lateral views demonstrate degenerative changes of the base of the thumb. No acute fracture or dislocation. No osseous destruction. No soft tissue gas. There may be dorsal soft tissue swelling about the carpal and metacarpal bones on the lateral view. No radiopaque foreign object. IMPRESSION: No acute osseous abnormality. Electronically Signed   By: Abigail Miyamoto M.D.   On: 01/23/2022 17:51    ASSESSMENT: FDG avid left lower lobe lung mass.  PLAN:    Anemia: Patient's hemoglobin is significantly improved to 11.1.  She does not require additional blood or IV iron.  Patient previously declined colonoscopy for further evaluation.  No intervention is needed.  Return to clinic in approximately 2 months with repeat laboratory work, further evaluation, and consideration of IV iron or blood. FDG avid left lower lobe lung mass: Highly suspicious for underlying malignancy.  PET scan results from December 08, 2020 reviewed independently with a 3.5 cm hypermetabolic mass with no mediastinal or hilar adenopathy noted.  Given patient's underlying pulmonary and cardiac disease, she was not a surgical candidate and was unable to undergo EBUS/bronchoscopy for possible biopsy and lymph node sampling secondary to high risk with anesthesia.  Patient proceeded directly to XRT which she completed in approximately December 2022.  CT the scan results from December 15, 2021 reviewed independently with no obvious evidence of recurrent or progressive disease.  Repeat CT scan in approximately March 2024.   PET positive sigmoid colon lesion: Given suspicious lesion as well as progressive anemia, concern was for a second primary.  Patient has declined colonoscopy or any surgical intervention.     Patient expressed understanding and was in agreement with this plan. She also understands that She can call clinic at any time with any questions, concerns, or complaints.    Cancer  Staging  No matching staging information was found for the patient.  Lloyd Huger, MD   02/07/2022 12:36 PM

## 2022-02-08 ENCOUNTER — Inpatient Hospital Stay: Payer: Medicare Other

## 2022-03-14 ENCOUNTER — Inpatient Hospital Stay: Payer: Medicare Other | Attending: Oncology

## 2022-03-14 ENCOUNTER — Other Ambulatory Visit: Payer: Self-pay

## 2022-03-14 ENCOUNTER — Ambulatory Visit
Admission: RE | Admit: 2022-03-14 | Discharge: 2022-03-14 | Disposition: A | Discharge status: Home / Self Care | Payer: Medicare Other | Source: Ambulatory Visit | Attending: Medical Oncology | Admitting: Medical Oncology

## 2022-03-14 DIAGNOSIS — R918 Other nonspecific abnormal finding of lung field: Secondary | ICD-10-CM | POA: Insufficient documentation

## 2022-03-14 DIAGNOSIS — D649 Anemia, unspecified: Secondary | ICD-10-CM

## 2022-03-14 DIAGNOSIS — D500 Iron deficiency anemia secondary to blood loss (chronic): Secondary | ICD-10-CM

## 2022-03-14 DIAGNOSIS — D509 Iron deficiency anemia, unspecified: Secondary | ICD-10-CM | POA: Insufficient documentation

## 2022-03-14 DIAGNOSIS — K639 Disease of intestine, unspecified: Secondary | ICD-10-CM | POA: Diagnosis not present

## 2022-03-14 LAB — COMPREHENSIVE METABOLIC PANEL
ALT: 21 U/L (ref 0–44)
AST: 17 U/L (ref 15–41)
Albumin: 4 g/dL (ref 3.5–5.0)
Alkaline Phosphatase: 72 U/L (ref 38–126)
Anion gap: 10 (ref 5–15)
BUN: 10 mg/dL (ref 6–20)
CO2: 30 mmol/L (ref 22–32)
Calcium: 8.7 mg/dL — ABNORMAL LOW (ref 8.9–10.3)
Chloride: 99 mmol/L (ref 98–111)
Creatinine, Ser: 0.59 mg/dL (ref 0.44–1.00)
GFR, Estimated: >60 mL/min (ref >60)
Glucose, Bld: 133 mg/dL — ABNORMAL HIGH (ref 70–99)
Potassium: 4.1 mmol/L (ref 3.5–5.1)
Sodium: 139 mmol/L (ref 135–145)
Total Bilirubin: 0.3 mg/dL (ref 0.3–1.2)
Total Protein: 7.7 g/dL (ref 6.5–8.1)

## 2022-03-14 LAB — CBC WITH DIFFERENTIAL/PLATELET
Abs Immature Granulocytes: 0.05 10*3/uL (ref 0.00–0.07)
Basophils Absolute: 0 10*3/uL (ref 0.0–0.1)
Basophils Relative: 0 %
Eosinophils Absolute: 0.1 10*3/uL (ref 0.0–0.5)
Eosinophils Relative: 2 %
HCT: 38.4 % (ref 36.0–46.0)
Hemoglobin: 12.3 g/dL (ref 12.0–15.0)
Immature Granulocytes: 1 %
Lymphocytes Relative: 13 %
Lymphs Abs: 1 10*3/uL (ref 0.7–4.0)
MCH: 24.9 pg — ABNORMAL LOW (ref 26.0–34.0)
MCHC: 32 g/dL (ref 30.0–36.0)
MCV: 77.9 fL — ABNORMAL LOW (ref 80.0–100.0)
Monocytes Absolute: 0.6 10*3/uL (ref 0.1–1.0)
Monocytes Relative: 7 %
Neutro Abs: 6 10*3/uL (ref 1.7–7.7)
Neutrophils Relative %: 77 %
Platelets: 228 10*3/uL (ref 150–400)
RBC: 4.93 MIL/uL (ref 3.87–5.11)
RDW: 23.9 % — ABNORMAL HIGH (ref 11.5–15.5)
WBC: 7.8 10*3/uL (ref 4.0–10.5)
nRBC: 0 % (ref 0.0–0.2)

## 2022-03-14 LAB — IRON AND TIBC
Iron: 38 ug/dL (ref 28–170)
Saturation Ratios: 9 % — ABNORMAL LOW (ref 10.4–31.8)
TIBC: 419 ug/dL (ref 250–450)
UIBC: 381 ug/dL

## 2022-03-14 LAB — SAMPLE TO BLOOD BANK

## 2022-03-14 LAB — FERRITIN: Ferritin: 17 ng/mL (ref 11–307)

## 2022-03-14 LAB — POCT I-STAT CREATININE: Creatinine, Ser: 0.6 mg/dL (ref 0.44–1.00)

## 2022-03-14 MED ORDER — IOHEXOL 300 MG/ML  SOLN
75.0000 mL | Freq: Once | INTRAMUSCULAR | Status: AC | PRN
  Administered 2022-03-14: 75 mL via INTRAVENOUS

## 2022-03-16 ENCOUNTER — Telehealth: Payer: Self-pay | Admitting: *Deleted

## 2022-03-16 NOTE — Telephone Encounter (Signed)
Patient called asking if she needs to come in for Iron or blood transfusion today and if she can take off her armband if not getting blood.        Component Ref Range & Units 2 d ago 1 mo ago 2 mo ago  Iron 28 - 170 ug/dL 38 276 High  116  TIBC 250 - 450 ug/dL 419 409 514 High   Saturation Ratios 10.4 - 31.8 % 9 Low  68 High  23  UIBC ug/dL 381 133 CM 398 CM  Comment: Performed at [facility], [address] [place], [place] [address]  Resulting Agency  [facility]   Component Ref Range & Units 2 d ago 1 mo ago 2 mo ago  Ferritin 11 - 307 ng/mL 17 20 CM 8 Low  CM   Component Ref Range & Units 2 d ago (03/14/22) 1 mo ago (02/07/22) 1 mo ago (01/31/22) 1 mo ago (01/30/22) 1 mo ago (01/25/22) 1 mo ago (01/24/22) 1 mo ago (01/23/22)  WBC 4.0 - 10.5 K/uL 7.8 8.0 5.5 8.7 5.7 6.1 7.7  RBC 3.87 - 5.11 MIL/uL 4.93 5.06 4.32 4.64 4.65 4.53 4.78  Hemoglobin 12.0 - 15.0 g/dL 12.3 11.1 Low  9.1 Low  9.8 Low  9.7 Low  9.5 Low  10.0 Low   HCT 36.0 - 46.0 % 38.4 37.7 32.1 Low  33.9 Low  34.4 Low  33.8 Low  34.6 Low   MCV 80.0 - 100.0 fL 77.9 Low  74.5 Low  74.3 Low  73.1 Low  74.0 Low  74.6 Low  72.4 Low   MCH 26.0 - 34.0 pg 24.9 Low  21.9 Low  21.1 Low  21.1 Low  20.9 Low  21.0 Low  20.9 Low   MCHC 30.0 - 36.0 g/dL 32.0 29.4 Low  28.3 Low  28.9 Low  28.2 Low  28.1 Low  28.9 Low   RDW 11.5 - 15.5 % 23.9 High  30.5 High  Not Measured Not Measured Not Measured 30.7 High  30.7 High   Platelets 150 - 400 K/uL 228 311 307 320 320 311 335  nRBC 0.0 - 0.2 % 0.0 0.0 0.0 0.0 0.0 CM 0.0 CM 0.0  Neutrophils Relative % % 77 71 64 75   77  Neutro Abs 1.7 - 7.7 K/uL 6.0 5.8 3.6 6.5   5.9  Lymphocytes Relative % 13 18 21 13   13   Lymphs Abs 0.7 - 4.0 K/uL 1.0 1.4 1.2 1.1   1.0  Monocytes Relative % 7 8 11 11   9   Monocytes Absolute 0.1 - 1.0 K/uL 0.6 0.6 0.6 0.9   0.7  Eosinophils Relative % 2 2 3 1   1   Eosinophils Absolute 0.0 - 0.5 K/uL 0.1 0.1 0.1 0.1   0.1  Basophils  Relative % 0 0 0 0   0  Basophils Absolute 0.0 - 0.1 K/uL 0.0 0.0 0.0 0.0   0.0  Immature Granulocytes % 1 1 1  0   0  Abs Immature Granulocytes 0.00 - 0.07 K/uL 0.05 0.04 CM 0.04 0.03   0.03 CM  Comment: Performed at [facility], [address] [place], [place] [address]  WBC Morphology    MORPHOLOGY UNREMARKABLE MORPHOLOGY UNREMARKABLE     Smear Review    MORPHOLOGY UNREMARKABLE MORPHOLOGY UNREMARKABLE     Dimorphism    PRESENT CM PRESENT     Target Cells  PRESENT     Stomatocytes     PRESENT CM     Resulting Agency  [facility]         Specimen Collected: 03/14/22 10:42 Last Resulted: 03/14/22 11:49       Component Ref Range & Units 2 d ago (03/14/22) 2 d ago (03/14/22) 1 mo ago (01/31/22) 1 mo ago (01/30/22) 1 mo ago (01/25/22) 1 mo ago (01/24/22) 1 mo ago (01/23/22)  Sodium 135 - 145 mmol/L 139  141 137 136 138 134 Low   Potassium 3.5 - 5.1 mmol/L 4.1  3.7 3.9 3.7 3.4 Low  3.7  Chloride 98 - 111 mmol/L 99  101 98 97 Low  96 Low  95 Low   CO2 22 - 32 mmol/L 30  31 31  32 35 High  29  Glucose, Bld 70 - 99 mg/dL 133 High   153 High  CM 94 CM 180 High  CM 194 High  CM 173 High  CM  Comment: Glucose reference range applies only to samples taken after fasting for at least 8 hours.  BUN 6 - 20 mg/dL 10  7 9 7 11 11   Creatinine, Ser 0.44 - 1.00 mg/dL 0.59 0.60 0.60 0.55 0.64 0.67 0.61  Calcium 8.9 - 10.3 mg/dL 8.7 Low   8.7 Low  9.2 9.0 9.1 9.3  Total Protein 6.5 - 8.1 g/dL 7.7   7.4   7.6  Albumin 3.5 - 5.0 g/dL 4.0   3.6   3.6  AST 15 - 41 U/L 17   18   19   ALT 0 - 44 U/L 21   20   23   Alkaline Phosphatase 38 - 126 U/L 72   88   79  Total Bilirubin 0.3 - 1.2 mg/dL 0.3   0.7   0.6  GFR, Estimated >60 mL/min >60  >60 CM >60 CM >60 CM >60 CM >60 CM  Comment: (NOTE) Calculated using the CKD-EPI Creatinine Equation (2021)  Anion gap 5 - 15 10  9  CM 8 CM 7 CM 7 CM 10 CM

## 2022-03-18 IMAGING — DX DG CHEST 1V PORT
1 series · 1 of 1 positions shown · non-contrast
Comparison: Chest CT 10/19/2020 and earlier.

CLINICAL DATA: 54-year-old female with right lateral chest pain and
increased shortness of breath since yesterday.

EXAM:
PORTABLE CHEST 1 VIEW

[chest ap]
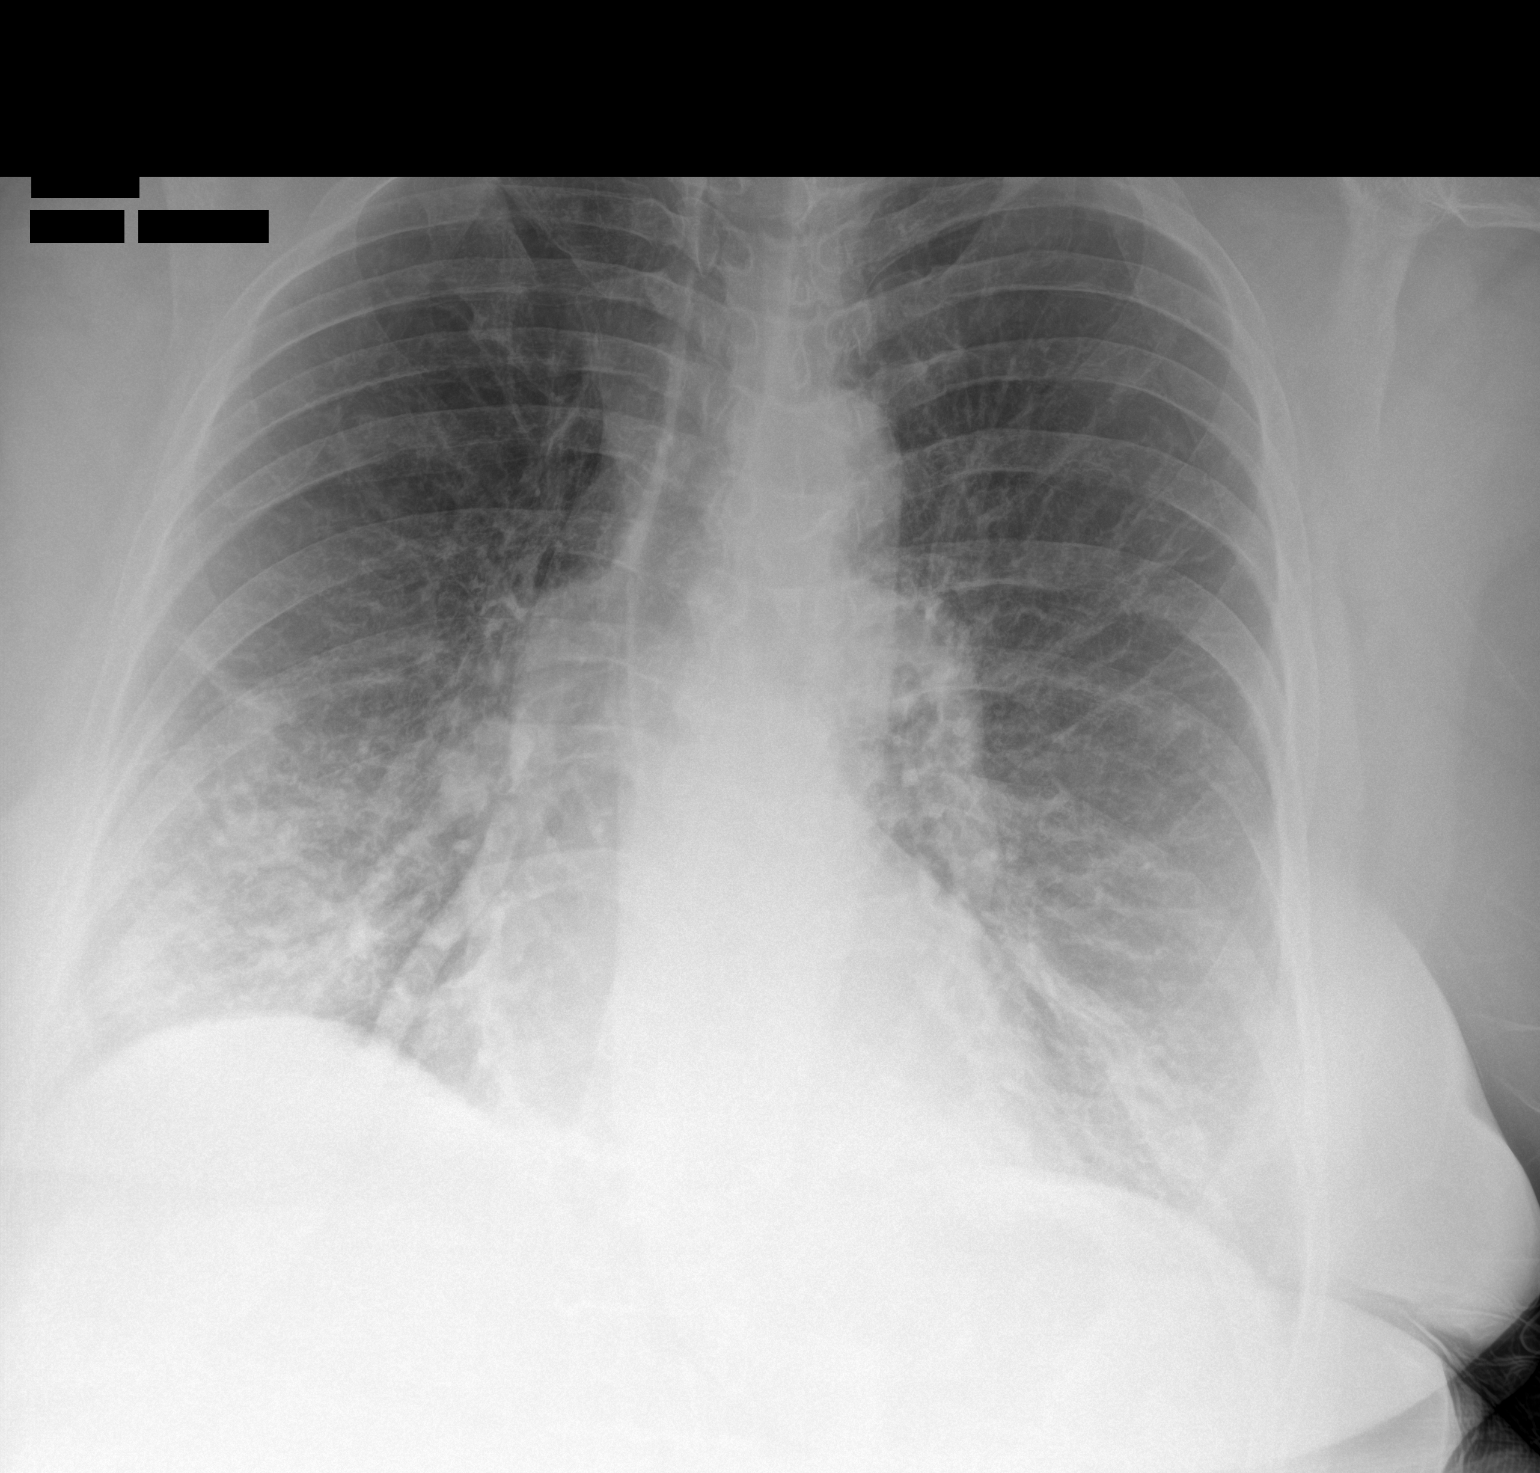

[1 of 1 positions shown; findings below may reference images not displayed]

FINDINGS: Portable AP semi upright view at 0501 hours. Emphysema demonstrated
by CT last month. Unresolved and mildly progressed reticulonodular
and patchy opacity at the right lung base, involving the right
middle lobe on the prior CT. Left lower lobe superior segment
cavitary lesion remains occult by portable chest. Streaky left lung
base opacity has mildly increased. Stable cardiac size and
mediastinal contours. Visualized tracheal air column is within
normal limits. No pneumothorax or pleural effusion.
IMPRESSION: 1. Unresolved and mildly progressed right middle lobe airspace
disease since the CT last month. Bronchopneumonia was favored at
that time.
2. Cavitary lesion of the superior segment left lower lobe is occult
by portable chest - please see prior CT report.
3. Emphysema.

## 2022-03-21 ENCOUNTER — Encounter: Payer: Self-pay | Admitting: Oncology

## 2022-03-23 ENCOUNTER — Inpatient Hospital Stay (HOSPITAL_BASED_OUTPATIENT_CLINIC_OR_DEPARTMENT_OTHER): Payer: Medicare Other | Admitting: Oncology

## 2022-03-23 ENCOUNTER — Encounter: Payer: Self-pay | Admitting: Oncology

## 2022-03-23 ENCOUNTER — Other Ambulatory Visit: Payer: Medicaid Other

## 2022-03-23 VITALS — BP 124/62 | HR 93 | Temp 99.1°F | Resp 18 | Ht 62.0 in | Wt 215.8 lb

## 2022-03-23 DIAGNOSIS — D509 Iron deficiency anemia, unspecified: Secondary | ICD-10-CM | POA: Diagnosis not present

## 2022-03-23 DIAGNOSIS — R918 Other nonspecific abnormal finding of lung field: Secondary | ICD-10-CM

## 2022-03-23 DIAGNOSIS — D500 Iron deficiency anemia secondary to blood loss (chronic): Secondary | ICD-10-CM

## 2022-03-23 NOTE — Progress Notes (Signed)
[facility]  [phone] [phone]  ID: Stephanie Ross OB: 11-10-1965  MR#: [id number]  GQQ#:[id number]  Patient Care Team: [facility] as PCP - [facility] [place], [place], [place] as Oncology Nurse Navigator  CHIEF COMPLAINT: Iron deficiency anemia, presumed malignant left lower lobe lung mass.  INTERVAL HISTORY: Patient returns to clinic today for repeat laboratory work, further evaluation, and discussion of her imaging results.  She continues to have chronic shortness of breath and requires oxygen 24 hours/day, but otherwise feels well.  She does not complain of weakness or fatigue today. She has no neurologic complaints.  She denies any recent fevers.  She has a fair appetite, but denies weight loss.  She denies any chest pain, cough, or hemoptysis.  She has no nausea, vomiting, constipation, or diarrhea.  She denies any melena or hematochezia.  She has no urinary complaints.  Patient offers no further specific complaints today.  REVIEW OF SYSTEMS:   Review of Systems  Constitutional: Negative.  Negative for fever, malaise/fatigue and weight loss.  Respiratory:  Positive for shortness of breath. Negative for cough and hemoptysis.   Cardiovascular: Negative.  Negative for chest pain and leg swelling.  Gastrointestinal: Negative.  Negative for abdominal pain, blood in stool and melena.  Genitourinary: Negative.  Negative for dysuria.  Musculoskeletal: Negative.  Negative for back pain.  Skin: Negative.  Negative for rash.  Neurological: Negative.  Negative for dizziness, focal weakness, weakness and headaches.  Psychiatric/Behavioral: Negative.  The patient is not nervous/anxious.     As per HPI. Otherwise, a complete review of systems is negative.  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    PAST SURGICAL HISTORY: Past Surgical History:  Procedure Laterality Date   BILATERAL CARPAL TUNNEL RELEASE     CERVICAL ABLATION      FAMILY HISTORY: Family History  Problem Relation Age of Onset   Colon polyps Father    Breast cancer Sister    Colon polyps Sister    Breast cancer Paternal Grandmother    Colon cancer Neg Hx     ADVANCED DIRECTIVES (Y/N):  N  HEALTH MAINTENANCE: Social History   Tobacco Use   Smoking status: Former    Packs/day: 0.25    Types: Cigarettes    Quit date: 07/17/2019    Years since quitting: 2.6   Smokeless tobacco: Never  Vaping Use   Vaping Use: Former   Quit date: 06/26/2019  Substance Use Topics   Alcohol use: No   Drug use: Not Currently    Types: Marijuana     Colonoscopy:  PAP:  Bone density:  Lipid panel:  Allergies  Allergen Reactions   Amoxicillin Other (See Comments)    Has patient had a PCN reaction causing immediate rash, facial/tongue/throat swelling,  SOB or lightheadedness with hypotension: Yes Has patient had a PCN reaction causing severe rash involving mucus membranes or skin necrosis: Yes Has patient had a PCN reaction that required hospitalization:yes Has patient had a PCN reaction occurring within the last 10 years: yes If all of the above answers are "NO", then may proceed with Cephalosporin use.    Prednisone Other (See Comments)    Full body rash    Doxycycline Other (See Comments)    Other reaction(s): Other (See Comments) "caused fluid in lungs" "caused fluid in lungs"    Metoprolol     Other  reaction(s): Dizziness   Other Other (See Comments)    Tide detergent - rash   Umeclidinium Other (See Comments)    "Fluid on lungs"    Current Outpatient Medications  Medication Sig Dispense Refill   albuterol (PROVENTIL) (2.5 MG/3ML) 0.083% nebulizer solution Take 2.5 mg by nebulization every 6 (six) hours as needed for wheezing or shortness of breath.     albuterol (VENTOLIN HFA) 108 (90 Base) MCG/ACT inhaler Inhale 2 puffs into the lungs every 4 (four) hours as needed for wheezing or shortness of breath. 18 g 3   atorvastatin (LIPITOR) 40 MG tablet Take 40 mg by mouth daily.     budesonide-formoterol (SYMBICORT) 160-4.5 MCG/ACT inhaler Inhale 2 puffs into the lungs 2 (two) times daily.     cetirizine (ZYRTEC) 10 MG tablet Take 10 mg by mouth daily.     clopidogrel (PLAVIX) 75 MG tablet Take 75 mg by mouth daily.     Coenzyme Q10 (COQ10) 100 MG CAPS Take 100 mg by mouth daily.      DALIRESP 500 MCG TABS tablet Take 500 mcg by mouth daily.     ergocalciferol (VITAMIN D2) 1.25 MG (50000 UT) capsule Take 50,000 Units by mouth every Monday.     Ferrous Sulfate (IRON PO) Take 1 tablet by mouth in the morning and at bedtime.     glimepiride (AMARYL) 2 MG tablet Take 2 mg by mouth every morning.     losartan (COZAAR) 25 MG tablet Take 25 mg by mouth daily.     montelukast (SINGULAIR) 10 MG tablet Take 10 mg by mouth at bedtime.     umeclidinium bromide (INCRUSE ELLIPTA) 62.5 MCG/ACT AEPB Inhale 1 puff into the lungs daily. 30 each 2   baclofen (LIORESAL) 10 MG tablet Take 10 mg by mouth 3 (three) times daily as needed for muscle spasms. (Patient not taking: Reported on 02/07/2022)     No current facility-administered medications for this visit.    OBJECTIVE: Vitals:   03/23/22 1401  BP: 124/62  Pulse: 93  Resp: 18  Temp: 99.1 F (37.3 C)  SpO2: 93%     Body mass index is 39.47 kg/m.    ECOG FS:1 - Symptomatic but completely ambulatory  General: Well-developed, well-nourished, no  acute distress.  Sitting in a wheelchair. Eyes: Pink conjunctiva, anicteric sclera. HEENT: Normocephalic, moist mucous membranes. Lungs: No audible wheezing or coughing. Heart: Regular rate and rhythm. Abdomen: Soft, nontender, no obvious distention. Musculoskeletal: No edema, cyanosis, or clubbing. Neuro: Alert, answering all questions appropriately. Cranial nerves grossly intact. Skin: No rashes or petechiae noted. Psych: Normal affect.   LAB RESULTS:  Lab Results  Component Value Date   NA 139 03/14/2022   K 4.1 03/14/2022   CL 99 03/14/2022   CO2 30 03/14/2022   GLUCOSE 133 (H) 03/14/2022   BUN 10 03/14/2022   CREATININE 0.59 03/14/2022  CALCIUM 8.7 (L) 03/14/2022   PROT 7.7 03/14/2022   ALBUMIN 4.0 03/14/2022   AST 17 03/14/2022   ALT 21 03/14/2022   ALKPHOS 72 03/14/2022   BILITOT 0.3 03/14/2022   GFRNONAA >60 03/14/2022   GFRAA >60 10/15/2019    Lab Results  Component Value Date   WBC 7.8 03/14/2022   NEUTROABS 6.0 03/14/2022   HGB 12.3 03/14/2022   HCT 38.4 03/14/2022   MCV 77.9 (L) 03/14/2022   PLT 228 03/14/2022   Lab Results  Component Value Date   IRON 38 03/14/2022   TIBC 419 03/14/2022   IRONPCTSAT 9 (L) 03/14/2022   Lab Results  Component Value Date   FERRITIN 17 03/14/2022     STUDIES: CT Chest W Contrast  Result Date: 03/15/2022 CLINICAL DATA:  Follow-up lung carcinoma. EXAM: CT CHEST WITH CONTRAST TECHNIQUE: Multidetector CT imaging of the chest was performed during intravenous contrast administration. RADIATION DOSE REDUCTION: This exam was performed according to the departmental dose-optimization program which includes automated exposure control, adjustment of the mA and/or kV according to patient size and/or use of iterative reconstruction technique. CONTRAST:  67mL OMNIPAQUE IOHEXOL 300 MG/ML  SOLN COMPARISON:  Prior exams, most recent dated 12/14/2021, oldest dated 05/07/2017. FINDINGS: Cardiovascular: Heart normal in size and  configuration. No pericardial effusion. Three-vessel coronary artery calcifications great vessels are normal in caliber. Mild aortic atherosclerosis. Mediastinum/Nodes: No neck base, mediastinal or hilar masses or enlarged lymph nodes. Trachea and esophagus are unremarkable. Lungs/Pleura: Stable band like opacity in the medial left lower lobe, posterior to the inferior hilum, consistent with treatment related scarring. Stable area discoid atelectasis in the right middle lobe. Lungs show changes of advanced centrilobular emphysema well as chronic peripheral interstitial thickening, the latter most evident in the lower lungs. No evidence of pneumonia or pulmonary edema. Lung mass or suspicious nodule. No pleural effusion or pneumothorax. Upper Abdomen: No acute findings.  No liver or adrenal masses. Musculoskeletal: No fracture or acute finding. No bone lesion. No chest wall mass. IMPRESSION: 1. Stable appearance of the chest since the prior exam. No evidence of recurrent lung carcinoma or metastatic disease. 2. No acute findings. Aortic Atherosclerosis (ICD10-I70.0) and Emphysema (ICD10-J43.9). Electronically Signed   By: Lajean Manes M.D.   On: 03/15/2022 13:23    ASSESSMENT: FDG avid left lower lobe lung mass.  PLAN:    Anemia: Resolved.  Patient's hemoglobin has improved to 12.3.  She has a normal iron panel, but her saturation ratio has decreased to 9%.  Return to clinic on April 07, 2022 to receive 1 infusion of 200 mg IV Venofer.  Patient within return to clinic in 3 months with repeat laboratory work and further evaluation.   FDG avid left lower lobe lung mass: Highly suspicious for underlying malignancy.  PET scan results from December 08, 2020 reviewed independently with a 3.5 cm hypermetabolic mass with no mediastinal or hilar adenopathy noted.  Given patient's underlying pulmonary and cardiac disease, she was not a surgical candidate and was unable to undergo EBUS/bronchoscopy for possible  biopsy and lymph node sampling secondary to high risk with anesthesia.  Patient proceeded directly to XRT which she completed in approximately December 2022.  Her most recent imaging with CT scan on March 14, 2022 reviewed independently and report as above with no obvious evidence of recurrent or progressive disease.  Continue to monitor.  Repeat CT scan in approximately June 2024.   PET positive sigmoid colon lesion: Given suspicious lesion as well as progressive  anemia, concern was for a second primary.  Patient has declined colonoscopy or any surgical intervention.    I spent a total of 30 minutes reviewing chart data, face-to-face evaluation with the patient, counseling and coordination of care as detailed above.    Patient expressed understanding and was in agreement with this plan. She also understands that She can call clinic at any time with any questions, concerns, or complaints.    Cancer Staging  No matching staging information was found for the patient.  Lloyd Huger, MD   03/23/2022 4:04 PM

## 2022-03-28 ENCOUNTER — Encounter: Payer: Self-pay | Admitting: Oncology

## 2022-03-31 ENCOUNTER — Encounter: Payer: Self-pay | Admitting: Oncology

## 2022-04-07 ENCOUNTER — Inpatient Hospital Stay: Payer: 59 | Attending: Oncology

## 2022-04-07 VITALS — BP 142/68 | HR 88 | Temp 98.2°F | Resp 18

## 2022-04-07 DIAGNOSIS — D509 Iron deficiency anemia, unspecified: Secondary | ICD-10-CM | POA: Insufficient documentation

## 2022-04-07 DIAGNOSIS — D500 Iron deficiency anemia secondary to blood loss (chronic): Secondary | ICD-10-CM

## 2022-04-07 MED ORDER — SODIUM CHLORIDE 0.9 % IV SOLN
Freq: Once | INTRAVENOUS | Status: AC
  Filled 2022-04-07: qty 250

## 2022-04-07 MED ORDER — SODIUM CHLORIDE 0.9 % IV SOLN
200.0000 mg | Freq: Once | INTRAVENOUS | Status: AC
  Administered 2022-04-07: 200 mg via INTRAVENOUS
  Filled 2022-04-07: qty 200

## 2022-04-07 NOTE — Patient Instructions (Signed)
Iron Sucrose Injection What is this medication? IRON SUCROSE (EYE ern SOO krose) treats low levels of iron (iron deficiency anemia) in people with kidney disease. Iron is a mineral that plays an important role in making red blood cells, which carry oxygen from your lungs to the rest of your body. This medicine may be used for other purposes; ask your health care provider or pharmacist if you have questions. COMMON BRAND NAME(S): Venofer What should I tell my care team before I take this medication? They need to know if you have any of these conditions: Anemia not caused by low iron levels Heart disease High levels of iron in the blood Kidney disease Liver disease An unusual or allergic reaction to iron, other medications, foods, dyes, or preservatives Pregnant or trying to get pregnant Breastfeeding How should I use this medication? This medication is for infusion into a vein. It is given in a hospital or clinic setting. Talk to your care team about the use of this medication in children. While this medication may be prescribed for children as young as 2 years for selected conditions, precautions do apply. Overdosage: If you think you have taken too much of this medicine contact a poison control center or emergency room at once. NOTE: This medicine is only for you. Do not share this medicine with others. What if I miss a dose? Keep appointments for follow-up doses. It is important not to miss your dose. Call your care team if you are unable to keep an appointment. What may interact with this medication? Do not take this medication with any of the following: Deferoxamine Dimercaprol Other iron products This medication may also interact with the following: Chloramphenicol Deferasirox This list may not describe all possible interactions. Give your health care provider a list of all the medicines, herbs, non-prescription drugs, or dietary supplements you use. Also tell them if you smoke,  drink alcohol, or use illegal drugs. Some items may interact with your medicine. What should I watch for while using this medication? Visit your care team regularly. Tell your care team if your symptoms do not start to get better or if they get worse. You may need blood work done while you are taking this medication. You may need to follow a special diet. Talk to your care team. Foods that contain iron include: whole grains/cereals, dried fruits, beans, or peas, leafy green vegetables, and organ meats (liver, kidney). What side effects may I notice from receiving this medication? Side effects that you should report to your care team as soon as possible: Allergic reactions--skin rash, itching, hives, swelling of the face, lips, tongue, or throat Low blood pressure--dizziness, feeling faint or lightheaded, blurry vision Shortness of breath Side effects that usually do not require medical attention (report to your care team if they continue or are bothersome): Flushing Headache Joint pain Muscle pain Nausea Pain, redness, or irritation at injection site This list may not describe all possible side effects. Call your doctor for medical advice about side effects. You may report side effects to FDA at 1-800-FDA-1088. Where should I keep my medication? This medication is given in a hospital or clinic and will not be stored at home. NOTE: This sheet is a summary. It may not cover all possible information. If you have questions about this medicine, talk to your doctor, pharmacist, or health care provider.  2023 Elsevier/Gold Standard (2020-06-24 00:00:00)

## 2022-04-10 ENCOUNTER — Other Ambulatory Visit: Payer: Medicaid Other

## 2022-04-11 ENCOUNTER — Ambulatory Visit: Payer: Medicaid Other

## 2022-04-11 ENCOUNTER — Ambulatory Visit: Payer: Medicaid Other | Admitting: Oncology

## 2022-05-17 ENCOUNTER — Telehealth: Payer: Self-pay | Admitting: *Deleted

## 2022-05-17 NOTE — Telephone Encounter (Signed)
Patient called and states that her PCP drew labs yesterdady and thinks she drew same labs we want next month. She ios asking if she will need labs redrawn next month or not and if so she would like to come in for that lab on same day she is to see Dr Baruch Gouty 06/14/22 to save another long trip here. Her PCP is supposed to be faxing a copy of lab results to you. Please advise

## 2022-05-18 ENCOUNTER — Encounter: Payer: Self-pay | Admitting: Oncology

## 2022-06-14 ENCOUNTER — Encounter: Payer: Self-pay | Admitting: Radiation Oncology

## 2022-06-14 ENCOUNTER — Ambulatory Visit
Admission: RE | Admit: 2022-06-14 | Discharge: 2022-06-14 | Disposition: A | Discharge status: Home / Self Care | Payer: 59 | Source: Ambulatory Visit | Attending: Radiation Oncology | Admitting: Radiation Oncology

## 2022-06-14 VITALS — BP 121/66 | HR 90 | Temp 97.5°F | Resp 20 | Ht 62.0 in

## 2022-06-14 DIAGNOSIS — Z923 Personal history of irradiation: Secondary | ICD-10-CM | POA: Diagnosis not present

## 2022-06-14 DIAGNOSIS — R918 Other nonspecific abnormal finding of lung field: Secondary | ICD-10-CM

## 2022-06-14 DIAGNOSIS — R911 Solitary pulmonary nodule: Secondary | ICD-10-CM | POA: Diagnosis not present

## 2022-06-14 DIAGNOSIS — J449 Chronic obstructive pulmonary disease, unspecified: Secondary | ICD-10-CM | POA: Diagnosis not present

## 2022-06-14 NOTE — Progress Notes (Signed)
Radiation Oncology Follow up Note  Name: Stephanie Ross   Date:   06/14/2022 MRN:  [id number] DOB: [birth year]    This 56 y.o. female presents to the clinic today for 47-month follow-up status post SBRT to her left lower lobe for presumed stage Ib (T2 N0 M0) non-small cell lung cancer patient with severe COPD.  REFERRING PROVIDER: [facility]  HPI: Patient is a 57 year old female now out 15 months having completed SBRT to her left lower lobe for presumed stage Ib non-small cell lung cancer.  She is oxygen dependent she states her breathing capacity has not changed over the past year.  She specifically denies cough hemoptysis or chest tightness..  She had a CT scan back in December which I reviewed showed stable appearance of the chest no evidence of recurrent recurrent or progressive disease.  COMPLICATIONS OF TREATMENT: none  FOLLOW UP COMPLIANCE: keeps appointments   PHYSICAL EXAM:  BP 121/66   Pulse 90   Temp (!) 97.5 F (36.4 C)   Resp 20   Ht 5\' 2"  (1.575 m)   SpO2 95%   PF (!) 3 L/min   BMI 39.47 kg/m  Wheelchair-bound female on nasal oxygen in NAD.  Well-developed well-nourished patient in NAD. HEENT reveals PERLA, EOMI, discs not visualized.  Oral cavity is clear. No oral mucosal lesions are identified. Neck is clear without evidence of cervical or supraclavicular adenopathy. Lungs are clear to A&P. Cardiac examination is essentially unremarkable with regular rate and rhythm without murmur rub or thrill. Abdomen is benign with no organomegaly or masses noted. Motor sensory and DTR levels are equal and symmetric in the upper and lower extremities. Cranial nerves II through XII are grossly intact. Proprioception is intact. No peripheral adenopathy or edema is identified. No motor or sensory levels are noted. Crude visual fields are within normal range.  RADIOLOGY RESULTS: CT scans reviewed compatible with above-stated findings  PLAN: Present time patient is doing  well stable pulmonary functions 15 months out from SBRT.  On pleased with her overall progress.  Of asked to see her back in 1 year for follow-up.  She also has follow-up CT scans in June which I will review with additional available.  Patient is to call with any concerns.  I would like to take this opportunity to thank you for allowing me to participate in the care of your patient.Noreene Filbert, MD

## 2022-06-14 NOTE — Progress Notes (Signed)
Survivorship Care Plan visit completed.  Treatment summary reviewed and given to patient.  ASCO answers booklet reviewed and given to patient.  CARE program and Cancer Transitions discussed with patient along with other resources cancer center offers to patients and caregivers.  Patient verbalized understanding.    

## 2022-06-22 ENCOUNTER — Other Ambulatory Visit: Payer: Medicaid Other

## 2022-09-20 ENCOUNTER — Ambulatory Visit
Admission: RE | Admit: 2022-09-20 | Discharge: 2022-09-20 | Disposition: A | Discharge status: Home / Self Care | Payer: 59 | Source: Ambulatory Visit | Attending: Oncology | Admitting: Oncology

## 2022-09-20 DIAGNOSIS — D500 Iron deficiency anemia secondary to blood loss (chronic): Secondary | ICD-10-CM | POA: Diagnosis present

## 2022-09-20 DIAGNOSIS — R918 Other nonspecific abnormal finding of lung field: Secondary | ICD-10-CM | POA: Diagnosis present

## 2022-09-20 LAB — POCT I-STAT CREATININE: Creatinine, Ser: 0.9 mg/dL (ref 0.44–1.00)

## 2022-09-20 MED ORDER — IOHEXOL 300 MG/ML  SOLN
75.0000 mL | Freq: Once | INTRAMUSCULAR | Status: AC | PRN
  Administered 2022-09-20: 75 mL via INTRAVENOUS

## 2022-09-22 ENCOUNTER — Inpatient Hospital Stay: Payer: 59 | Attending: Oncology

## 2022-09-22 IMAGING — PT NM PET TUM IMG RESTAG (PS) SKULL BASE T - THIGH
1 of 7 series · 1 of 25 positions shown · non-contrast
Comparison: PET-CT 12/08/2020.  Chest CT 11/19/2020

CLINICAL DATA: Subsequent treatment strategy for left lower lobe
lung cancer. Post radiation therapy.

EXAM:
NUCLEAR MEDICINE PET SKULL BASE TO THIGH
TECHNIQUE: 11.27 mCi F-18 FDG was injected intravenously. Full-ring PET imaging
was performed from the skull base to thigh after the radiotracer. CT
data was obtained and used for attenuation correction and anatomic
localization.
Fasting blood glucose: 173 mg/dl

[Series 3: ctac · axial · 3.0mm · 0.98mm/px · 1 of 263 slices shown]
[im 263/263  brain]
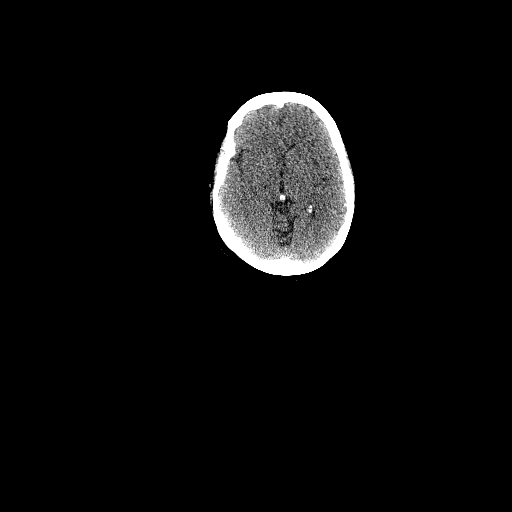

[1 of 25 positions shown; findings below may reference images not displayed]

FINDINGS: Mediastinal blood pool activity: SUV max

NECK:

No hypermetabolic cervical lymph nodes are identified.There are no
lesions of the pharyngeal mucosal space. Physiologic activity
associated with the tongue.

Incidental CT findings: Bilateral carotid atherosclerosis.

CHEST:

There are no hypermetabolic mediastinal, hilar or axillary lymph
nodes. The treated cavitary lesion medially in the left lower lobe
is significantly smaller and abuts the descending thoracic aorta,
measuring approximately 3.0 x 1.1 cm on image 43/7 (previously up to
3.4 by 2.5 cm). This demonstrates no significantly increased
metabolic activity relative to the adjacent blood pool in the
thoracic aorta (SUV max 4.0). No new hypermetabolic or enlarging
pulmonary nodules identified.

Incidental CT findings: Moderate centrilobular and paracentral
emphysema with scattered pulmonary scarring. Atherosclerosis of the
aorta, great vessels and coronary arteries. There are calcifications
of the aortic valve.

ABDOMEN/PELVIS:

There is no hypermetabolic activity within the liver, adrenal
glands, spleen or pancreas. There is no hypermetabolic nodal
activity. Focal hypermetabolic activity is again noted within the
right pelvis (SUV max 13.4, previously 8.4). This appears to be
associated with the lumen of the sigmoid colon and is suspicious for
an underlying villous adenoma or early colon cancer.

Incidental CT findings: Moderate stool throughout the colon. No
evidence of bowel obstruction or perforation. Diffuse aortic and
branch vessel atherosclerosis.

SKELETON:

There is no hypermetabolic activity to suggest osseous metastatic
disease.

Incidental CT findings: Physiologic activity within the hands.
IMPRESSION: 1. Near complete resolution of previously demonstrated
hypermetabolic activity associated with the cavitary left lower lobe
lung mass. This mass is significantly smaller with metabolic
activity similar to blood pool in the adjacent aorta.
2. No evidence of metastatic disease.  No new pulmonary findings.
3. Increasingly hypermetabolic focus in the pelvis which appears to
be associated with the sigmoid colon, suspicious for a villous
adenoma or early colon cancer. Recommend colonoscopy if not recently
performed.
4. Coronary and aortic atherosclerosis ([id number].N). Emphysema
(R2SWD-Q87.4).

## 2022-09-29 ENCOUNTER — Ambulatory Visit: Payer: Medicaid Other | Admitting: Oncology

## 2022-10-02 ENCOUNTER — Encounter: Payer: Self-pay | Admitting: Oncology

## 2022-10-02 ENCOUNTER — Inpatient Hospital Stay: Payer: 59 | Attending: Oncology | Admitting: Oncology

## 2022-10-02 VITALS — BP 100/47 | HR 90 | Temp 98.8°F | Resp 18 | Ht 62.0 in | Wt 218.0 lb

## 2022-10-02 DIAGNOSIS — R935 Abnormal findings on diagnostic imaging of other abdominal regions, including retroperitoneum: Secondary | ICD-10-CM | POA: Diagnosis not present

## 2022-10-02 DIAGNOSIS — R918 Other nonspecific abnormal finding of lung field: Secondary | ICD-10-CM

## 2022-10-02 DIAGNOSIS — D509 Iron deficiency anemia, unspecified: Secondary | ICD-10-CM | POA: Diagnosis not present

## 2022-10-02 DIAGNOSIS — D500 Iron deficiency anemia secondary to blood loss (chronic): Secondary | ICD-10-CM | POA: Diagnosis not present

## 2022-10-02 DIAGNOSIS — K639 Disease of intestine, unspecified: Secondary | ICD-10-CM | POA: Insufficient documentation

## 2022-10-02 NOTE — Progress Notes (Signed)
[facility]  [phone] [phone]  ID: Stephanie Ross OB: Sep 27, 1965  MR#: [id number]  AOZ#:[id number]  Patient Care Team: Alvina Filbert, MD as PCP - General (Internal Medicine) Glory Buff, RN as Oncology Nurse Navigator Orlie Dakin, Tollie Pizza, MD as Consulting Physician (Oncology) Carmina Miller, MD as Consulting Physician (Radiation Oncology)  CHIEF COMPLAINT: Iron deficiency anemia, presumed malignant left lower lobe lung mass.  INTERVAL HISTORY: Patient returns to clinic today for further evaluation and discussion of her imaging results. She continues to have chronic shortness of breath and requires oxygen 24 hours/day, but otherwise feels well.  She does not complain of weakness or fatigue today.  She has no neurologic complaints.  She denies any recent fevers.  She has a fair appetite, but denies weight loss.  She denies any chest pain, cough, or hemoptysis.  She has no nausea, vomiting, constipation, or diarrhea.  She denies any melena or hematochezia.  She has no urinary complaints.  Patient offers no further specific complaints today.  REVIEW OF SYSTEMS:   Review of Systems  Constitutional: Negative.  Negative for fever, malaise/fatigue and weight loss.  Respiratory:  Positive for shortness of breath. Negative for cough and hemoptysis.   Cardiovascular: Negative.  Negative for chest pain and leg swelling.  Gastrointestinal: Negative.  Negative for abdominal pain, blood in stool and melena.  Genitourinary: Negative.  Negative for dysuria.  Musculoskeletal: Negative.  Negative for back pain.  Skin: Negative.  Negative for rash.  Neurological: Negative.  Negative for dizziness, focal weakness, weakness and headaches.  Psychiatric/Behavioral: Negative.  The patient is not nervous/anxious.     As per HPI. Otherwise, a complete review of systems is negative.  PAST MEDICAL HISTORY: Past Medical History:  Diagnosis Date  . ADHD  (attention deficit hyperactivity disorder)   . Anxiety   . Arthritis   . Asthma   . Bipolar disorder (HCC)   . Cancer (HCC)    left lung cancer  . CHF (congestive heart failure) (HCC)   . COPD (chronic obstructive pulmonary disease) (HCC)   . Coronary artery disease   . Depression   . Dyspnea   . GERD (gastroesophageal reflux disease)   . Hypertension   . On home oxygen therapy    3L O2 via [place] continuously  . Peripheral vascular disease (HCC)   . Pneumonia   . Stroke (HCC)    x 2 with left sided sensation deficits    PAST SURGICAL HISTORY: Past Surgical History:  Procedure Laterality Date  . BILATERAL CARPAL TUNNEL RELEASE    . CERVICAL ABLATION      FAMILY HISTORY: Family History  Problem Relation Age of Onset  . Colon polyps Father   . Breast cancer Sister   . Colon polyps Sister   . Breast cancer Paternal Grandmother   . Colon cancer Neg Hx     ADVANCED DIRECTIVES (Y/N):  N  HEALTH MAINTENANCE: Social History   Tobacco Use  . Smoking status: Former    Packs/day: .25    Types: Cigarettes    Quit date: 07/17/2019    Years since quitting: 3.2  . Smokeless tobacco: Never  Vaping Use  . Vaping Use: Former  . Quit date: 06/26/2019  Substance Use Topics  . Alcohol use: No  . Drug use: Not Currently    Types: Marijuana     Colonoscopy:  PAP:  Bone density:  Lipid panel:  Allergies  Allergen Reactions  . Amoxicillin Other (See Comments)  Has patient had a PCN reaction causing immediate rash, facial/tongue/throat swelling, SOB or lightheadedness with hypotension: Yes Has patient had a PCN reaction causing severe rash involving mucus membranes or skin necrosis: Yes Has patient had a PCN reaction that required hospitalization:yes Has patient had a PCN reaction occurring within the last 10 years: yes If all of the above answers are "NO", then may proceed with Cephalosporin use.   . Prednisone Other (See Comments)    Full body rash   . Doxycycline Other  (See Comments)    Other reaction(s): Other (See Comments) "caused fluid in lungs" "caused fluid in lungs"   . Metoprolol     Other reaction(s): Dizziness  . Other Other (See Comments)    Tide detergent - rash  . Umeclidinium Other (See Comments)    "Fluid on lungs"    Current Outpatient Medications  Medication Sig Dispense Refill  . Accu-Chek Softclix Lancets lancets See admin instructions.    Marland Kitchen albuterol (PROVENTIL) (2.5 MG/3ML) 0.083% nebulizer solution Take 2.5 mg by nebulization every 6 (six) hours as needed for wheezing or shortness of breath.    Marland Kitchen albuterol (VENTOLIN HFA) 108 (90 Base) MCG/ACT inhaler Inhale 2 puffs into the lungs every 4 (four) hours as needed for wheezing or shortness of breath. 18 g 3  . atorvastatin (LIPITOR) 40 MG tablet Take 40 mg by mouth daily.    . budesonide-formoterol (SYMBICORT) 160-4.5 MCG/ACT inhaler Inhale 2 puffs into the lungs 2 (two) times daily.    . cetirizine (ZYRTEC) 10 MG tablet Take 10 mg by mouth daily.    . clopidogrel (PLAVIX) 75 MG tablet Take 75 mg by mouth daily.    . Coenzyme Q10 (COQ10) 100 MG CAPS Take 100 mg by mouth daily.     Marland Kitchen DALIRESP 500 MCG TABS tablet Take 500 mcg by mouth daily.    . ergocalciferol (VITAMIN D2) 1.25 MG (50000 UT) capsule Take 50,000 Units by mouth every Monday.    . Ferrous Sulfate (IRON PO) Take 1 tablet by mouth in the morning and at bedtime.    . gabapentin (NEURONTIN) 100 MG capsule Take 100 mg twice a day for one week, then increase to 200 mg(2 tablets) twice a day and continue    . glimepiride (AMARYL) 2 MG tablet Take 6 mg by mouth every morning.    Marland Kitchen losartan (COZAAR) 25 MG tablet Take 25 mg by mouth daily.    . meloxicam (MOBIC) 7.5 MG tablet TAKE 1 TABLET BY MOUTH ONCE DAILY. for 30    . montelukast (SINGULAIR) 10 MG tablet Take 10 mg by mouth at bedtime.    . nortriptyline (PAMELOR) 10 MG capsule Take 10 mg as needed for headache    . Oxycodone HCl 10 MG TABS Take 10 mg by mouth every 4 (four)  hours.    Marland Kitchen OZEMPIC, 0.25 OR 0.5 MG/DOSE, 2 MG/3ML SOPN Inject 0.5 mg into the skin once a week.    . baclofen (LIORESAL) 10 MG tablet Take 10 mg by mouth 3 (three) times daily as needed for muscle spasms. (Patient not taking: Reported on 02/07/2022)    . insulin detemir (LEVEMIR FLEXPEN) 100 UNIT/ML FlexPen 10 units at dinner time Subcutaneous for 30 days (Patient not taking: Reported on 06/14/2022)    . umeclidinium bromide (INCRUSE ELLIPTA) 62.5 MCG/ACT AEPB Inhale 1 puff into the lungs daily. 30 each 2   No current facility-administered medications for this visit.    OBJECTIVE: Vitals:   10/02/22 1108  BP: (!) 100/47  Pulse: 90  Resp: 18  Temp: 98.8 F (37.1 C)  SpO2: 90%     Body mass index is 39.87 kg/m.    ECOG FS:1 - Symptomatic but completely ambulatory  General: Well-developed, well-nourished, no acute distress.  Sitting in a wheelchair. Text from brought to me she Sheral Flow hold hold hold hold all and that right Eyes: Pink conjunctiva, anicteric sclera. HEENT: Normocephalic, moist mucous membranes. Lungs: No audible wheezing or coughing. Heart: Regular rate and rhythm. Abdomen: Soft, nontender, no obvious distention. Musculoskeletal: No edema, cyanosis, or clubbing. Neuro: Alert, answering all questions appropriately. Cranial nerves grossly intact. Skin: No rashes or petechiae noted. Psych: Normal affect.  LAB RESULTS:  Lab Results  Component Value Date   NA 139 03/14/2022   K 4.1 03/14/2022   CL 99 03/14/2022   CO2 30 03/14/2022   GLUCOSE 133 (H) 03/14/2022   BUN 10 03/14/2022   CREATININE 0.90 09/20/2022   CALCIUM 8.7 (L) 03/14/2022   PROT 7.7 03/14/2022   ALBUMIN 4.0 03/14/2022   AST 17 03/14/2022   ALT 21 03/14/2022   ALKPHOS 72 03/14/2022   BILITOT 0.3 03/14/2022   GFRNONAA >60 03/14/2022   GFRAA >60 10/15/2019    Lab Results  Component Value Date   WBC 7.8 03/14/2022   NEUTROABS 6.0 03/14/2022   HGB 12.3 03/14/2022   HCT 38.4 03/14/2022   MCV  77.9 (L) 03/14/2022   PLT 228 03/14/2022   Lab Results  Component Value Date   IRON 38 03/14/2022   TIBC 419 03/14/2022   IRONPCTSAT 9 (L) 03/14/2022   Lab Results  Component Value Date   FERRITIN 17 03/14/2022     STUDIES: CT Chest W Contrast  Result Date: 09/25/2022 CLINICAL DATA:  Left lower lobe lung cancer treated with radiation therapy in 2022. Restaging. * Tracking Code: BO * EXAM: CT CHEST WITH CONTRAST TECHNIQUE: Multidetector CT imaging of the chest was performed during intravenous contrast administration. RADIATION DOSE REDUCTION: This exam was performed according to the departmental dose-optimization program which includes automated exposure control, adjustment of the mA and/or kV according to patient size and/or use of iterative reconstruction technique. CONTRAST:  75mL OMNIPAQUE IOHEXOL 300 MG/ML  SOLN COMPARISON:  03/14/2022 chest CT. FINDINGS: Cardiovascular: Normal heart size. No significant pericardial effusion/thickening. Three-vessel coronary atherosclerosis. Atherosclerotic nonaneurysmal thoracic aorta. Normal caliber pulmonary arteries. No central pulmonary emboli. Mediastinum/Nodes: No significant thyroid nodules. Unremarkable esophagus. No pathologically enlarged axillary, mediastinal or hilar lymph nodes. Lungs/Pleura: No pneumothorax. No pleural effusion. Severe centrilobular emphysema with diffuse bronchial wall thickening. Stable mild sharply marginated bandlike consolidation in the medial aspect of the superior segment left lower lobe with associated mild volume loss and distortion, compatible with mild radiation fibrosis. No acute consolidative airspace disease or new significant pulmonary nodules. Upper abdomen: No acute abnormality. Musculoskeletal: No aggressive appearing focal osseous lesions. Mild thoracic spondylosis. IMPRESSION: 1. 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 No evidence of local tumor recurrence. 2. No evidence of  metastatic disease in the chest. 3. Three-vessel coronary atherosclerosis. 4. Aortic Atherosclerosis (ICD10-I70.0) and Emphysema (ICD10-J43.9). Electronically Signed   By: Delbert Phenix M.D.   On: 09/25/2022 08:21    ASSESSMENT: FDG avid left lower lobe lung mass.  PLAN:    Anemia: Resolved.  Patient's hemoglobin has improved to 12.3.  She has a normal iron panel, but her saturation ratio has decreased to 9%.  Return to clinic on April 07, 2022 to receive 1 infusion  of 200 mg IV Venofer.  Patient within return to clinic in 3 months with repeat laboratory work and further evaluation.   FDG avid left lower lobe lung mass: Highly suspicious for underlying malignancy.  PET scan results from December 08, 2020 reviewed independently with a 3.5 cm hypermetabolic mass with no mediastinal or hilar adenopathy noted.  Given patient's underlying pulmonary and cardiac disease, she was not a surgical candidate and was unable to undergo EBUS/bronchoscopy for possible biopsy and lymph node sampling secondary to high risk with anesthesia.  Patient proceeded directly to XRT which she completed in approximately December 2022.  Her most recent imaging with CT scan on March 14, 2022 reviewed independently and report as above with no obvious evidence of recurrent or progressive disease.  Continue to monitor.  Repeat CT scan in approximately June 2024.   PET positive sigmoid colon lesion: Given suspicious lesion as well as progressive anemia, concern was for a second primary.  Patient has declined colonoscopy or any surgical intervention.    I spent a total of 30 minutes reviewing chart data, face-to-face evaluation with the patient, counseling and coordination of care as detailed above.    Patient expressed understanding and was in agreement with this plan. She also understands that She can call clinic at any time with any questions, concerns, or complaints.    Cancer Staging  No matching staging information was  found for the patient.  Jeralyn Ruths, MD   10/02/2022 1:57 PM

## 2022-10-03 ENCOUNTER — Encounter: Payer: Self-pay | Admitting: Oncology

## 2022-11-14 ENCOUNTER — Other Ambulatory Visit: Payer: Self-pay | Admitting: *Deleted

## 2022-11-14 ENCOUNTER — Inpatient Hospital Stay: Payer: 59 | Attending: Oncology

## 2022-11-14 DIAGNOSIS — D509 Iron deficiency anemia, unspecified: Secondary | ICD-10-CM | POA: Diagnosis not present

## 2022-11-14 DIAGNOSIS — D500 Iron deficiency anemia secondary to blood loss (chronic): Secondary | ICD-10-CM

## 2022-11-14 LAB — IRON AND TIBC
Iron: 52 ug/dL (ref 28–170)
Saturation Ratios: 14 % (ref 10.4–31.8)
TIBC: 379 ug/dL (ref 250–450)
UIBC: 327 ug/dL

## 2022-11-14 LAB — CBC WITH DIFFERENTIAL/PLATELET
Abs Immature Granulocytes: 0.04 10*3/uL (ref 0.00–0.07)
Basophils Absolute: 0 10*3/uL (ref 0.0–0.1)
Basophils Relative: 0 %
Eosinophils Absolute: 0.1 10*3/uL (ref 0.0–0.5)
Eosinophils Relative: 1 %
HCT: 43.3 % (ref 36.0–46.0)
Hemoglobin: 14.4 g/dL (ref 12.0–15.0)
Immature Granulocytes: 0 %
Lymphocytes Relative: 16 %
Lymphs Abs: 1.7 10*3/uL (ref 0.7–4.0)
MCH: 27.7 pg (ref 26.0–34.0)
MCHC: 33.3 g/dL (ref 30.0–36.0)
MCV: 83.4 fL (ref 80.0–100.0)
Monocytes Absolute: 0.8 10*3/uL (ref 0.1–1.0)
Monocytes Relative: 8 %
Neutro Abs: 7.8 10*3/uL — ABNORMAL HIGH (ref 1.7–7.7)
Neutrophils Relative %: 75 %
Platelets: 252 10*3/uL (ref 150–400)
RBC: 5.19 MIL/uL — ABNORMAL HIGH (ref 3.87–5.11)
RDW: 15.3 % (ref 11.5–15.5)
WBC: 10.4 10*3/uL (ref 4.0–10.5)
nRBC: 0 % (ref 0.0–0.2)

## 2022-11-14 LAB — FERRITIN: Ferritin: 67 ng/mL (ref 11–307)

## 2022-11-15 ENCOUNTER — Other Ambulatory Visit: Payer: Self-pay | Admitting: Internal Medicine

## 2022-11-15 DIAGNOSIS — R1902 Left upper quadrant abdominal swelling, mass and lump: Secondary | ICD-10-CM

## 2022-11-16 ENCOUNTER — Telehealth: Payer: Self-pay

## 2022-11-16 NOTE — Telephone Encounter (Addendum)
Patient aware of results and expressed understanding.   ----- Message from Jeralyn Ruths sent at 11/16/2022 12:59 PM EDT ----- Labs are normal. No iron needed. F/u as scheduled. ----- Message ----- From: Leory Plowman, Lab In [place] Sent: 11/14/2022   3:33 PM EDT To: Jeralyn Ruths, MD

## 2022-11-22 ENCOUNTER — Ambulatory Visit
Admission: RE | Admit: 2022-11-22 | Discharge: 2022-11-22 | Disposition: A | Discharge status: Home / Self Care | Payer: 59 | Source: Ambulatory Visit | Attending: Internal Medicine | Admitting: Internal Medicine

## 2022-11-22 DIAGNOSIS — R1902 Left upper quadrant abdominal swelling, mass and lump: Secondary | ICD-10-CM | POA: Insufficient documentation

## 2022-11-22 MED ORDER — IOHEXOL 300 MG/ML  SOLN
100.0000 mL | Freq: Once | INTRAMUSCULAR | Status: AC | PRN
  Administered 2022-11-22: 100 mL via INTRAVENOUS

## 2022-12-30 ENCOUNTER — Other Ambulatory Visit: Payer: Self-pay

## 2022-12-30 ENCOUNTER — Emergency Department (HOSPITAL_COMMUNITY)
Admission: EM | Admit: 2022-12-30 | Discharge: 2022-12-31 | Disposition: A | Discharge status: Home / Self Care | Payer: 59 | Attending: Emergency Medicine | Admitting: Emergency Medicine

## 2022-12-30 ENCOUNTER — Encounter (HOSPITAL_COMMUNITY): Payer: Self-pay | Admitting: Emergency Medicine

## 2022-12-30 DIAGNOSIS — Z794 Long term (current) use of insulin: Secondary | ICD-10-CM | POA: Diagnosis not present

## 2022-12-30 DIAGNOSIS — Z7902 Long term (current) use of antithrombotics/antiplatelets: Secondary | ICD-10-CM | POA: Diagnosis not present

## 2022-12-30 DIAGNOSIS — R197 Diarrhea, unspecified: Secondary | ICD-10-CM | POA: Diagnosis not present

## 2022-12-30 DIAGNOSIS — D72829 Elevated white blood cell count, unspecified: Secondary | ICD-10-CM | POA: Insufficient documentation

## 2022-12-30 DIAGNOSIS — E871 Hypo-osmolality and hyponatremia: Secondary | ICD-10-CM | POA: Insufficient documentation

## 2022-12-30 DIAGNOSIS — E860 Dehydration: Secondary | ICD-10-CM | POA: Insufficient documentation

## 2022-12-30 DIAGNOSIS — R531 Weakness: Secondary | ICD-10-CM | POA: Diagnosis present

## 2022-12-30 LAB — CBC WITH DIFFERENTIAL/PLATELET
Abs Immature Granulocytes: 0.07 10*3/uL (ref 0.00–0.07)
Basophils Absolute: 0 10*3/uL (ref 0.0–0.1)
Basophils Relative: 0 %
Eosinophils Absolute: 0 10*3/uL (ref 0.0–0.5)
Eosinophils Relative: 0 %
HCT: 43.1 % (ref 36.0–46.0)
Hemoglobin: 14.8 g/dL (ref 12.0–15.0)
Immature Granulocytes: 1 %
Lymphocytes Relative: 14 %
Lymphs Abs: 1.6 10*3/uL (ref 0.7–4.0)
MCH: 28.6 pg (ref 26.0–34.0)
MCHC: 34.3 g/dL (ref 30.0–36.0)
MCV: 83.2 fL (ref 80.0–100.0)
Monocytes Absolute: 0.8 10*3/uL (ref 0.1–1.0)
Monocytes Relative: 7 %
Neutro Abs: 8.7 10*3/uL — ABNORMAL HIGH (ref 1.7–7.7)
Neutrophils Relative %: 78 %
Platelets: 224 10*3/uL (ref 150–400)
RBC: 5.18 MIL/uL — ABNORMAL HIGH (ref 3.87–5.11)
RDW: 15.2 % (ref 11.5–15.5)
WBC: 11.2 10*3/uL — ABNORMAL HIGH (ref 4.0–10.5)
nRBC: 0 % (ref 0.0–0.2)

## 2022-12-30 LAB — COMPREHENSIVE METABOLIC PANEL
ALT: 64 U/L — ABNORMAL HIGH (ref 0–44)
AST: 30 U/L (ref 15–41)
Albumin: 4.1 g/dL (ref 3.5–5.0)
Alkaline Phosphatase: 77 U/L (ref 38–126)
Anion gap: 11 (ref 5–15)
BUN: 10 mg/dL (ref 6–20)
CO2: 25 mmol/L (ref 22–32)
Calcium: 9.3 mg/dL (ref 8.9–10.3)
Chloride: 91 mmol/L — ABNORMAL LOW (ref 98–111)
Creatinine, Ser: 0.65 mg/dL (ref 0.44–1.00)
GFR, Estimated: >60 mL/min (ref >60)
Glucose, Bld: 127 mg/dL — ABNORMAL HIGH (ref 70–99)
Potassium: 3.5 mmol/L (ref 3.5–5.1)
Sodium: 127 mmol/L — ABNORMAL LOW (ref 135–145)
Total Bilirubin: 1.4 mg/dL — ABNORMAL HIGH (ref 0.3–1.2)
Total Protein: 7.8 g/dL (ref 6.5–8.1)

## 2022-12-30 LAB — MAGNESIUM: Magnesium: 1.7 mg/dL (ref 1.7–2.4)

## 2022-12-30 MED ORDER — SODIUM CHLORIDE 0.9 % IV BOLUS
1000.0000 mL | Freq: Once | INTRAVENOUS | Status: AC
  Administered 2022-12-30: 1000 mL via INTRAVENOUS

## 2022-12-30 MED ORDER — ONDANSETRON HCL 4 MG/2ML IJ SOLN
4.0000 mg | Freq: Once | INTRAMUSCULAR | Status: AC
  Administered 2022-12-30: 4 mg via INTRAVENOUS
  Filled 2022-12-30: qty 2

## 2022-12-30 NOTE — ED Triage Notes (Addendum)
Pt bib EMS from home with c/o generalized weakness. Pt endorses Nausea (no vomiting) and diarrhea x 2 days along with chills (no fever) since weaning self off Gabapentin per MD orders. Pt with hx COPD and wears O2 @ 3L [place] continuously.

## 2022-12-30 NOTE — ED Provider Notes (Signed)
[place] EMERGENCY DEPARTMENT AT [facility] Provider Note   CSN: [id number] Arrival date & time: 12/30/22  2051     History {Add pertinent medical, surgical, social history, OB history to HPI:1} No chief complaint on file.   Stephanie Ross is a 56 y.o. female.  HPI   This patient is a 57 year old female presenting by ambulance after reporting having about 2 weeks of progressive weakness after stopping opiates and going on to gabapentin, she has been very sleepy and not eating or drinking as much.  The patient reports no chest pain, no shortness of breath (on chronic oxygen), no edema, no dysuria, she has had 2 days of watery diarrhea and feels diffusely generally weak.  She was advised to come here for evaluation to make sure she was okay.  She denies fevers or chills.  Paramedics report no abnormal significant vital signs  Home Medications Prior to Admission medications   Medication Sig Start Date End Date Taking? Authorizing Provider  Accu-Chek Softclix Lancets lancets See admin instructions. 01/12/22   [provider]  albuterol (PROVENTIL) (2.5 MG/3ML) 0.083% nebulizer solution Take 2.5 mg by nebulization every 6 (six) hours as needed for wheezing or shortness of breath.    [provider]  albuterol (VENTOLIN HFA) 108 (90 Base) MCG/ACT inhaler Inhale 2 puffs into the lungs every 4 (four) hours as needed for wheezing or shortness of breath. 01/31/22   Shon Hale, MD  atorvastatin (LIPITOR) 40 MG tablet Take 40 mg by mouth daily. 05/30/21   [provider]  baclofen (LIORESAL) 10 MG tablet Take 10 mg by mouth 3 (three) times daily as needed for muscle spasms. Patient not taking: Reported on 02/07/2022 10/26/20   [provider]  budesonide-formoterol (SYMBICORT) 160-4.5 MCG/ACT inhaler Inhale 2 puffs into the lungs 2 (two) times daily.    [provider]  cetirizine (ZYRTEC) 10 MG tablet Take 10 mg by mouth daily.     [provider]  clopidogrel (PLAVIX) 75 MG tablet Take 75 mg by mouth daily. 09/08/19   [provider]  Coenzyme Q10 (COQ10) 100 MG CAPS Take 100 mg by mouth daily.     [provider]  DALIRESP 500 MCG TABS tablet Take 500 mcg by mouth daily. 11/15/20   [provider]  ergocalciferol (VITAMIN D2) 1.25 MG (50000 UT) capsule Take 50,000 Units by mouth every Monday.    [provider]  Ferrous Sulfate (IRON PO) Take 1 tablet by mouth in the morning and at bedtime.    [provider]  gabapentin (NEURONTIN) 100 MG capsule Take 100 mg twice a day for one week, then increase to 200 mg(2 tablets) twice a day and continue 05/22/22   [provider]  glimepiride (AMARYL) 2 MG tablet Take 6 mg by mouth every morning. 01/11/22   [provider]  insulin detemir (LEVEMIR FLEXPEN) 100 UNIT/ML FlexPen 10 units at dinner time Subcutaneous for 30 days Patient not taking: Reported on 06/14/2022 03/07/22   [provider]  losartan (COZAAR) 25 MG tablet Take 25 mg by mouth daily. 03/03/21   [provider]  meloxicam (MOBIC) 7.5 MG tablet TAKE 1 TABLET BY MOUTH ONCE DAILY. for 30 04/25/21   [provider]  montelukast (SINGULAIR) 10 MG tablet Take 10 mg by mouth at bedtime. 08/02/19   [provider]  nortriptyline (PAMELOR) 10 MG capsule Take 10 mg as needed for headache 12/20/20   [provider]  Oxycodone HCl  10 MG TABS Take 10 mg by mouth every 4 (four) hours. 09/25/22   [provider]  OZEMPIC, 0.25 OR 0.5 MG/DOSE, 2 MG/3ML SOPN Inject 0.5 mg into the skin once a week.    [provider]  umeclidinium bromide (INCRUSE ELLIPTA) 62.5 MCG/ACT AEPB Inhale 1 puff into the lungs daily. 01/31/22   Shon Hale, MD      Allergies    Amoxicillin, Prednisone, Doxycycline, Metoprolol, Other, and Umeclidinium    Review of Systems   Review of Systems  All other systems reviewed and are  negative.   Physical Exam Updated Vital Signs There were no vitals taken for this visit. Physical Exam Vitals and nursing note reviewed.  Constitutional:      General: She is not in acute distress.    Appearance: She is well-developed.  HENT:     Head: Normocephalic and atraumatic.     Mouth/Throat:     Pharynx: No oropharyngeal exudate.  Eyes:     General: No scleral icterus.       Right eye: No discharge.        Left eye: No discharge.     Conjunctiva/sclera: Conjunctivae normal.     Pupils: Pupils are equal, round, and reactive to light.  Neck:     Thyroid: No thyromegaly.     Vascular: No JVD.  Cardiovascular:     Rate and Rhythm: Normal rate and regular rhythm.     Heart sounds: Normal heart sounds. No murmur heard.    No friction rub. No gallop.  Pulmonary:     Effort: Pulmonary effort is normal. No respiratory distress.     Breath sounds: Normal breath sounds. No wheezing or rales.  Abdominal:     General: Bowel sounds are normal. There is no distension.     Palpations: Abdomen is soft. There is no mass.     Tenderness: There is no abdominal tenderness.  Musculoskeletal:        General: No tenderness. Normal range of motion.     Cervical back: Normal range of motion and neck supple.     Right lower leg: No edema.     Left lower leg: No edema.  Lymphadenopathy:     Cervical: No cervical adenopathy.  Skin:    General: Skin is warm and dry.     Findings: No erythema or rash.  Neurological:     Mental Status: She is alert.     Coordination: Coordination normal.  Psychiatric:        Behavior: Behavior normal.     ED Results / Procedures / Treatments   Labs (all labs ordered are listed, but only abnormal results are displayed) Labs Reviewed  CBC WITH DIFFERENTIAL/PLATELET  COMPREHENSIVE METABOLIC PANEL  URINALYSIS, ROUTINE W REFLEX MICROSCOPIC  MAGNESIUM    EKG None  Radiology No results found.  Procedures Procedures  {Document cardiac monitor,  telemetry assessment procedure when appropriate:1}  Medications Ordered in ED Medications  sodium chloride 0.9 % bolus 1,000 mL (has no administration in time range)    ED Course/ Medical Decision Making/ A&P   {   Click here for ABCD2, HEART and other calculatorsREFRESH Note before signing :1}                              Medical Decision Making Amount and/or Complexity of Data Reviewed Labs: ordered. ECG/medicine tests: ordered.    This patient presents to the ED  for concern of weakness and possible dehydration, this involves an extensive number of treatment options, and is a complaint that carries with it a high risk of complications and morbidity.  The differential diagnosis includes opiate withdrawal, dehydration, electrolyte abnormalities, renal dysfunction   Co morbidities that complicate the patient evaluation  Chronic pain, chronic oxygen therapy   Additional history obtained:  Additional history obtained from med record External records from outside source obtained and reviewed including recent visit to Dr. On 1 October, had increasing sleepiness when they increase the gabapentin from 300 mg up to 600 mg, has now started taking a THC gummy, diagnosed with "chronic pain syndrome and left upper quadrant abdominal pain".  CT scan was performed August 28, possible: Polyp but no signs of metastatic disease, was supposed to be taking a buprenorphine patch, continuing oxycodone 10 mg every 4 hours   Lab Tests:  I Ordered, and personally interpreted labs.  The pertinent results include:  ***   Imaging Studies ordered:  I ordered imaging studies including ***  I independently visualized and interpreted imaging which showed *** I agree with the radiologist interpretation   Cardiac Monitoring: / EKG:  The patient was maintained on a cardiac monitor.  I personally viewed and interpreted the cardiac monitored which showed an underlying rhythm of: ***   Consultations  Obtained:  I requested consultation with the ***,  and discussed lab and imaging findings as well as pertinent plan - they recommend: ***   Problem List / ED Course / Critical interventions / Medication management  *** I ordered medication including ***  for ***  Reevaluation of the patient after these medicines showed that the patient {resolved/improved/worsened:23923::"improved"} I have reviewed the patients home medicines and have made adjustments as needed   Social Determinants of Health:  ***   Test / Admission - Considered:  ***   {Document critical care time when appropriate:1} {Document review of labs and clinical decision tools ie heart score, Chads2Vasc2 etc:1}  {Document your independent review of radiology images, and any outside records:1} {Document your discussion with family members, caretakers, and with consultants:1} {Document social determinants of health affecting pt's care:1} {Document your decision making why or why not admission, treatments were needed:1} Final Clinical Impression(s) / ED Diagnoses Final diagnoses:  None    Rx / DC Orders ED Discharge Orders     None

## 2022-12-31 DIAGNOSIS — E860 Dehydration: Secondary | ICD-10-CM | POA: Diagnosis not present

## 2022-12-31 LAB — URINALYSIS, ROUTINE W REFLEX MICROSCOPIC
Bilirubin Urine: NEGATIVE
Glucose, UA: NEGATIVE mg/dL
Hgb urine dipstick: NEGATIVE
Ketones, ur: 5 mg/dL — AB
Nitrite: NEGATIVE
Protein, ur: 30 mg/dL — AB
Specific Gravity, Urine: 1.021 (ref 1.005–1.030)
pH: 6 (ref 5.0–8.0)

## 2022-12-31 MED ORDER — ONDANSETRON 4 MG PO TBDP: 4.0000 mg | ORAL_TABLET | Freq: Three times a day (TID) | ORAL | 0 refills | Status: AC | PRN

## 2022-12-31 NOTE — Discharge Instructions (Signed)
Zofran is a medication which can help with nausea.  You may take 4 mg by mouth every 6 hours as needed if you are an adult, if your child under the age of 6 take half of a tablet or 2 mg every 6 hours as needed.  This should dissolve on your tongue within a short timeframe.  Wait about 30 minutes after taking it to help with drinking clear liquids.  Diarrhea can be treated with medications like Imodium, drinking plenty of clear liquids, please follow-up with your doctor this week for recheck.  They may need to alter your medications because of the chronic pain medicines that you have been taking.  Thank you for allowing [place] to treat you in the emergency department today.  After reviewing your examination and potential testing that was done it appears that you are safe to go home.  I would like for you to follow-up with your doctor within the next several days, have them obtain your records and follow-up with them to review all potential tests and results from your visit.  If you should develop severe or worsening symptoms return to the emergency department immediately

## 2022-12-31 NOTE — ED Provider Notes (Signed)
12:25 AM Assumed care from Dr. Hyacinth Meeker, please see their note for full history, physical and decision making until this point. In brief this is a 57 y.o. year old female who presented to the ED tonight with Weakness     Getting fluids to ensure urine and no UTI. Mild low Na. Pending recheck of same and likely d/c.   Urine not overtly infected but is different than previous urinalysis. Dr. Hyacinth Meeker had previously discussed d/c instructions with patient.    Labs, studies and imaging reviewed by myself and considered in medical decision making if ordered. Imaging interpreted by radiology.  Labs Reviewed  CBC WITH DIFFERENTIAL/PLATELET - Abnormal; Notable for the following components:      Result Value   WBC 11.2 (*)    RBC 5.18 (*)    Neutro Abs 8.7 (*)    All other components within normal limits  COMPREHENSIVE METABOLIC PANEL - Abnormal; Notable for the following components:   Sodium 127 (*)    Chloride 91 (*)    Glucose, Bld 127 (*)    ALT 64 (*)    Total Bilirubin 1.4 (*)    All other components within normal limits  MAGNESIUM  URINALYSIS, ROUTINE W REFLEX MICROSCOPIC    No orders to display    No follow-ups on file.    Ikeem Cleckler, Barbara Cower, MD 12/31/22 [phone]

## 2023-01-01 LAB — URINE CULTURE

## 2023-04-04 ENCOUNTER — Inpatient Hospital Stay: Payer: 59 | Attending: Oncology

## 2023-04-04 ENCOUNTER — Ambulatory Visit
Admission: RE | Admit: 2023-04-04 | Discharge: 2023-04-04 | Disposition: A | Discharge status: Home / Self Care | Payer: 59 | Source: Ambulatory Visit | Attending: Oncology | Admitting: Oncology

## 2023-04-04 DIAGNOSIS — R935 Abnormal findings on diagnostic imaging of other abdominal regions, including retroperitoneum: Secondary | ICD-10-CM | POA: Insufficient documentation

## 2023-04-04 DIAGNOSIS — Z7902 Long term (current) use of antithrombotics/antiplatelets: Secondary | ICD-10-CM | POA: Insufficient documentation

## 2023-04-04 DIAGNOSIS — D500 Iron deficiency anemia secondary to blood loss (chronic): Secondary | ICD-10-CM

## 2023-04-04 DIAGNOSIS — J439 Emphysema, unspecified: Secondary | ICD-10-CM | POA: Diagnosis not present

## 2023-04-04 DIAGNOSIS — D509 Iron deficiency anemia, unspecified: Secondary | ICD-10-CM | POA: Diagnosis present

## 2023-04-04 DIAGNOSIS — R9389 Abnormal findings on diagnostic imaging of other specified body structures: Secondary | ICD-10-CM | POA: Diagnosis not present

## 2023-04-04 DIAGNOSIS — Z7951 Long term (current) use of inhaled steroids: Secondary | ICD-10-CM | POA: Diagnosis not present

## 2023-04-04 DIAGNOSIS — Z79899 Other long term (current) drug therapy: Secondary | ICD-10-CM | POA: Insufficient documentation

## 2023-04-04 DIAGNOSIS — K639 Disease of intestine, unspecified: Secondary | ICD-10-CM | POA: Diagnosis not present

## 2023-04-04 DIAGNOSIS — I700 Atherosclerosis of aorta: Secondary | ICD-10-CM | POA: Insufficient documentation

## 2023-04-04 DIAGNOSIS — R918 Other nonspecific abnormal finding of lung field: Secondary | ICD-10-CM | POA: Diagnosis present

## 2023-04-04 DIAGNOSIS — I2510 Atherosclerotic heart disease of native coronary artery without angina pectoris: Secondary | ICD-10-CM | POA: Insufficient documentation

## 2023-04-04 LAB — CBC WITH DIFFERENTIAL/PLATELET
Abs Immature Granulocytes: 0.04 10*3/uL (ref 0.00–0.07)
Basophils Absolute: 0 10*3/uL (ref 0.0–0.1)
Basophils Relative: 0 %
Eosinophils Absolute: 0.1 10*3/uL (ref 0.0–0.5)
Eosinophils Relative: 1 %
HCT: 41.5 % (ref 36.0–46.0)
Hemoglobin: 13.7 g/dL (ref 12.0–15.0)
Immature Granulocytes: 1 %
Lymphocytes Relative: 15 %
Lymphs Abs: 1.2 10*3/uL (ref 0.7–4.0)
MCH: 29.5 pg (ref 26.0–34.0)
MCHC: 33 g/dL (ref 30.0–36.0)
MCV: 89.2 fL (ref 80.0–100.0)
Monocytes Absolute: 0.7 10*3/uL (ref 0.1–1.0)
Monocytes Relative: 9 %
Neutro Abs: 6 10*3/uL (ref 1.7–7.7)
Neutrophils Relative %: 74 %
Platelets: 207 10*3/uL (ref 150–400)
RBC: 4.65 MIL/uL (ref 3.87–5.11)
RDW: 14 % (ref 11.5–15.5)
WBC: 8.1 10*3/uL (ref 4.0–10.5)
nRBC: 0 % (ref 0.0–0.2)

## 2023-04-04 LAB — IRON AND TIBC
Iron: 71 ug/dL (ref 28–170)
Saturation Ratios: 19 % (ref 10.4–31.8)
TIBC: 370 ug/dL (ref 250–450)
UIBC: 299 ug/dL

## 2023-04-04 LAB — FERRITIN: Ferritin: 120 ng/mL (ref 11–307)

## 2023-04-04 MED ORDER — IOHEXOL 300 MG/ML  SOLN
100.0000 mL | Freq: Once | INTRAMUSCULAR | Status: AC | PRN
  Administered 2023-04-04: 100 mL via INTRAVENOUS

## 2023-04-05 LAB — CEA: CEA: 1.8 ng/mL (ref 0.0–4.7)

## 2023-04-09 ENCOUNTER — Inpatient Hospital Stay (HOSPITAL_BASED_OUTPATIENT_CLINIC_OR_DEPARTMENT_OTHER): Payer: 59 | Admitting: Oncology

## 2023-04-09 ENCOUNTER — Encounter: Payer: Self-pay | Admitting: Oncology

## 2023-04-09 VITALS — BP 118/55 | HR 77 | Temp 97.6°F | Resp 19 | Wt 195.4 lb

## 2023-04-09 DIAGNOSIS — D509 Iron deficiency anemia, unspecified: Secondary | ICD-10-CM | POA: Diagnosis not present

## 2023-04-09 DIAGNOSIS — R918 Other nonspecific abnormal finding of lung field: Secondary | ICD-10-CM | POA: Diagnosis not present

## 2023-04-09 NOTE — Progress Notes (Signed)
 [facility]  [phone] [phone]  ID: Stephanie Ross OB: 08-13-1965  MR#: [id number]  RDW#:[id number]  Patient Care Team: Katrinka Aquas, MD as PCP - General (Internal Medicine) Verdene Gills, RN as Oncology Nurse Navigator Jacobo, Evalene PARAS, MD as Consulting Physician (Oncology) Lenn Aran, MD as Consulting Physician (Radiation Oncology)  CHIEF COMPLAINT: Iron  deficiency anemia, presumed malignant left lower lobe lung mass.  INTERVAL HISTORY: Patient returns to clinic today for further evaluation and discussion of her laboratory and imaging results.  She continues to have chronic shortness of breath requiring oxygen 24 hours/day, but otherwise feels well.  She does not complain of weakness or fatigue today.  She has no neurologic complaints.  She denies any recent fevers.  She has a fair appetite, but denies weight loss.  She denies any chest pain, cough, or hemoptysis.  She has no nausea, vomiting, constipation, or diarrhea.  She denies any melena or hematochezia.  She has no urinary complaints.  Patient offers no further specific complaints today.  REVIEW OF SYSTEMS:   Review of Systems  Constitutional: Negative.  Negative for fever, malaise/fatigue and weight loss.  Respiratory:  Positive for shortness of breath. Negative for cough and hemoptysis.   Cardiovascular: Negative.  Negative for chest pain and leg swelling.  Gastrointestinal: Negative.  Negative for abdominal pain, blood in stool and melena.  Genitourinary: Negative.  Negative for dysuria.  Musculoskeletal: Negative.  Negative for back pain.  Skin: Negative.  Negative for rash.  Neurological: Negative.  Negative for dizziness, focal weakness, weakness and headaches.  Psychiatric/Behavioral: Negative.  The patient is not nervous/anxious.     As per HPI. Otherwise, a complete review of systems is negative.  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    PAST SURGICAL HISTORY: Past Surgical History:  Procedure Laterality Date   BILATERAL CARPAL TUNNEL RELEASE     CERVICAL ABLATION      FAMILY HISTORY: Family History  Problem Relation Age of Onset   Colon polyps Father    Breast cancer Sister    Colon polyps Sister    Breast cancer Paternal Grandmother    Colon cancer Neg Hx     ADVANCED DIRECTIVES (Y/N):  N  HEALTH MAINTENANCE: Social History   Tobacco Use   Smoking status: Former    Current packs/day: 0.00    Types: Cigarettes    Quit date: 07/17/2019    Years since quitting: 3.7   Smokeless tobacco: Never  Vaping Use   Vaping status: Former   Quit date: 06/26/2019  Substance Use Topics   Alcohol use: No   Drug use: Not Currently    Types: Marijuana     Colonoscopy:  PAP:  Bone density:  Lipid panel:  Allergies  Allergen Reactions   Amoxicillin Other (See  Comments)    Has patient had a PCN reaction causing immediate rash, facial/tongue/throat swelling, SOB or lightheadedness with hypotension: Yes Has patient had a PCN reaction causing severe rash involving mucus membranes or skin necrosis: Yes Has patient had a PCN reaction that required hospitalization:yes Has patient had a PCN reaction occurring within the last 10 years: yes If all of the above answers are NO, then may proceed with Cephalosporin use.    Prednisone Other (See Comments)    Full body rash    Doxycycline  Other (See Comments)     Other reaction(s): Other (See Comments) caused fluid in lungs caused fluid in lungs    Metoprolol     Other reaction(s): Dizziness   Other Other (See Comments)    Tide detergent - rash   Umeclidinium Other (See Comments)    Fluid on lungs    Current Outpatient Medications  Medication Sig Dispense Refill   Accu-Chek Softclix Lancets lancets See admin instructions.     albuterol  (PROVENTIL ) (2.5 MG/3ML) 0.083% nebulizer solution Take 2.5 mg by nebulization every 6 (six) hours as needed for wheezing or shortness of breath.     albuterol  (VENTOLIN  HFA) 108 (90 Base) MCG/ACT inhaler Inhale 2 puffs into the lungs every 4 (four) hours as needed for wheezing or shortness of breath. 18 g 3   atorvastatin  (LIPITOR) 40 MG tablet Take 40 mg by mouth daily.     baclofen  (LIORESAL ) 10 MG tablet Take 10 mg by mouth 3 (three) times daily as needed for muscle spasms. (Patient not taking: Reported on 02/07/2022)     budesonide-formoterol  (SYMBICORT) 160-4.5 MCG/ACT inhaler Inhale 2 puffs into the lungs 2 (two) times daily.     cetirizine (ZYRTEC) 10 MG tablet Take 10 mg by mouth daily.     clopidogrel  (PLAVIX ) 75 MG tablet Take 75 mg by mouth daily.     Coenzyme Q10 (COQ10) 100 MG CAPS Take 100 mg by mouth daily.      DALIRESP  500 MCG TABS tablet Take 500 mcg by mouth daily.     ergocalciferol  (VITAMIN D2) 1.25 MG (50000 UT) capsule Take 50,000 Units by mouth every Monday.     Ferrous Sulfate (IRON  PO) Take 1 tablet by mouth in the morning and at bedtime.     gabapentin (NEURONTIN) 100 MG capsule Take 100 mg twice a day for one week, then increase to 200 mg(2 tablets) twice a day and continue (Patient not taking: Reported on 04/09/2023)     glimepiride (AMARYL) 2 MG tablet Take 6 mg by mouth every morning.     insulin  detemir (LEVEMIR FLEXPEN) 100 UNIT/ML FlexPen 10 units at dinner time Subcutaneous for 30 days (Patient not taking: Reported on 04/09/2023)     losartan  (COZAAR ) 25 MG tablet Take 25  mg by mouth daily.     meloxicam (MOBIC) 7.5 MG tablet TAKE 1 TABLET BY MOUTH ONCE DAILY. for 30     montelukast  (SINGULAIR ) 10 MG tablet Take 10 mg by mouth at bedtime.     nortriptyline  (PAMELOR ) 10 MG capsule Take 10 mg as needed for headache     ondansetron  (ZOFRAN -ODT) 4 MG disintegrating tablet Take 1 tablet (4 mg total) by mouth every 8 (eight) hours as needed for nausea. 10 tablet 0   Oxycodone  HCl 10 MG TABS Take 10 mg by mouth every 4 (four) hours.     OZEMPIC, 0.25 OR 0.5 MG/DOSE, 2 MG/3ML SOPN Inject 0.5 mg into the skin once a week.  umeclidinium bromide  (INCRUSE ELLIPTA ) 62.5 MCG/ACT AEPB Inhale 1 puff into the lungs daily. 30 each 2   No current facility-administered medications for this visit.    OBJECTIVE: Vitals:   04/09/23 1344  BP: (!) 118/55  Pulse: 77  Resp: 19  Temp: 97.6 F (36.4 C)  SpO2: 96%     Body mass index is 35.74 kg/m.    ECOG FS:1 - Symptomatic but completely ambulatory  General: Well-developed, well-nourished, no acute distress.  Sitting in a wheelchair. Eyes: Pink conjunctiva, anicteric sclera. HEENT: Normocephalic, moist mucous membranes. Lungs: No audible wheezing or coughing. Heart: Regular rate and rhythm. Abdomen: Soft, nontender, no obvious distention. Musculoskeletal: No edema, cyanosis, or clubbing. Neuro: Alert, answering all questions appropriately. Cranial nerves grossly intact. Skin: No rashes or petechiae noted. Psych: Normal affect.   LAB RESULTS:  Lab Results  Component Value Date   NA 127 (L) 12/30/2022   K 3.5 12/30/2022   CL 91 (L) 12/30/2022   CO2 25 12/30/2022   GLUCOSE 127 (H) 12/30/2022   BUN 10 12/30/2022   CREATININE 0.65 12/30/2022   CALCIUM  9.3 12/30/2022   PROT 7.8 12/30/2022   ALBUMIN 4.1 12/30/2022   AST 30 12/30/2022   ALT 64 (H) 12/30/2022   ALKPHOS 77 12/30/2022   BILITOT 1.4 (H) 12/30/2022   GFRNONAA >60 12/30/2022   GFRAA >60 10/15/2019    Lab Results  Component Value Date   WBC 8.1  04/04/2023   NEUTROABS 6.0 04/04/2023   HGB 13.7 04/04/2023   HCT 41.5 04/04/2023   MCV 89.2 04/04/2023   PLT 207 04/04/2023   Lab Results  Component Value Date   IRON  71 04/04/2023   TIBC 370 04/04/2023   IRONPCTSAT 19 04/04/2023   Lab Results  Component Value Date   FERRITIN 120 04/04/2023     STUDIES: CT CHEST ABDOMEN PELVIS W CONTRAST Result Date: 04/07/2023 CLINICAL DATA:  History of lung cancer lung mass, follow-up PET positive colon lesion, iron  deficiency anemia * Tracking Code: BO * EXAM: CT CHEST, ABDOMEN, AND PELVIS WITH CONTRAST TECHNIQUE: Multidetector CT imaging of the chest, abdomen and pelvis was performed following the standard protocol during bolus administration of intravenous contrast. RADIATION DOSE REDUCTION: This exam was performed according to the departmental dose-optimization program which includes automated exposure control, adjustment of the mA and/or kV according to patient size and/or use of iterative reconstruction technique. CONTRAST:  OMNIPAQUE  IOHEXOL  300 MG/ML SOLN additional oral enteric contrast COMPARISON:  CT abdomen pelvis, 11/22/2022, CT chest, 09/20/2022 FINDINGS: CT CHEST FINDINGS Cardiovascular: Aortic atherosclerosis. Normal heart size. Three-vessel coronary artery calcifications. No pericardial effusion. Mediastinum/Nodes: No enlarged mediastinal, hilar, or axillary lymph nodes. Thyroid  gland, trachea, and esophagus demonstrate no significant findings. Lungs/Pleura: Severe emphysema. Diffuse bilateral bronchial wall thickening. Unchanged post treatment appearance of the superior segment left lower lobe, with bandlike scarring and volume loss (series 4, image 79). No pleural effusion or pneumothorax. Musculoskeletal: No chest wall abnormality. No acute osseous findings. CT ABDOMEN PELVIS FINDINGS Hepatobiliary: No solid liver abnormality is seen. No gallstones, gallbladder wall thickening, or biliary dilatation. Pancreas: Unremarkable. No  pancreatic ductal dilatation or surrounding inflammatory changes. Spleen: Normal in size without significant abnormality. Adrenals/Urinary Tract: Adrenal glands are unremarkable. Kidneys are normal, without renal calculi, solid lesion, or hydronephrosis. Bladder is unremarkable. Stomach/Bowel: Stomach is within normal limits. Appendix appears normal. No evidence of bowel wall thickening, distention, or inflammatory changes. Previously described enhancing lesion in the sigmoid colon is not appreciated on today's examination (series 2,  image 79). Vascular/Lymphatic: Severe aortic atherosclerosis. No enlarged abdominal or pelvic lymph nodes. Reproductive: No mass or other abnormality. Other: No abdominal wall hernia or abnormality. No ascites. Musculoskeletal: No acute osseous findings. IMPRESSION: 1. Unchanged post treatment appearance of the superior segment left lower lobe, with bandlike scarring and volume loss. 2. No evidence of lymphadenopathy or metastatic disease in the chest, abdomen, or pelvis. 3. Previously described enhancing lesion in the sigmoid colon is not appreciated on today's examination. 4. Severe emphysema and diffuse bilateral bronchial wall thickening. 5. Coronary artery disease. Aortic Atherosclerosis (ICD10-I70.0) and Emphysema (ICD10-J43.9). Electronically Signed   By: Marolyn JONETTA Jaksch M.D.   On: 04/07/2023 22:40    ASSESSMENT: FDG avid left lower lobe lung mass.  PLAN:    Anemia: Resolved.  Patient's hemoglobin and iron  stores are now within normal limits.  Continue oral iron  supplementation.  Patient last received Venofer  on April 07, 2022.  Repeat laboratory in 6 months. FDG avid left lower lobe lung mass: Highly suspicious for underlying malignancy.  PET scan results from December 08, 2020 reviewed independently with a 3.5 cm hypermetabolic mass with no mediastinal or hilar adenopathy noted.  Given patient's underlying pulmonary and cardiac disease, she was not a surgical candidate  and was unable to undergo EBUS/bronchoscopy for possible biopsy and lymph node sampling secondary to high risk with anesthesia.  Patient proceeded directly to XRT which she completed in approximately December 2022.  Her most recent CT scan on January 2025 reviewed independently with no obvious evidence of recurrent or progressive disease.  No intervention is needed at this time.  Return to clinic in 6 months with repeat imaging and further evaluation.   PET positive sigmoid colon lesion: Given suspicious lesion as well as progressive anemia, concern was for a second primary.  Patient has declined colonoscopy or any surgical intervention.  Repeat CT scan did not reveal any suspicious pathology in her abdomen.   Patient expressed understanding and was in agreement with this plan. She also understands that She can call clinic at any time with any questions, concerns, or complaints.    Cancer Staging  No matching staging information was found for the patient.   Evalene JINNY Reusing, MD   04/09/2023 2:21 PM

## 2023-05-10 ENCOUNTER — Encounter: Payer: Self-pay | Admitting: Oncology

## 2023-07-30 NOTE — Progress Notes (Signed)
 Follow-up   History of Present Illness: Stephanie Ross is a 58 y.o. female very severe copd/asthma lung mass s/p xrt, chest ct stable who presents to clinic for follow up. Multiple pulmonary issues. On oxygen. No new complaints. Cough and wheezing stable. No angina or pleurisy. No fever or chill. No gerd. Minimum rhinitis. Did re qualify for 02 today./ Problem List:  Patient Active Problem List  Diagnosis  . Angina pectoris ()  . Arthritis  . Asthma (HHS-HCC)  . Chest discomfort  . Dizziness  . GERD (gastroesophageal reflux disease)  . Other emphysema (CMS/HHS-HCC)  . Palpitations  . Tobacco use  . Cerebrovascular accident (CVA) (CMS/HHS-HCC)  . Left-sided weakness  . Numbness  . Difficulty walking  . Headache disorder    Current Medications:  Current Outpatient Medications  Medication Sig Dispense Refill  . ACCU-CHEK GUIDE ME GLUCOSE MTR Misc     . ACCU-CHEK GUIDE TEST STRIPS test strip     . ACCU-CHEK SOFTCLIX LANCETS lancets as directed    . albuterol  (PROVENTIL ) 2.5 mg /3 mL (0.083 %) nebulizer solution TAKE 3 MLS (2.5 MG TOTAL) BY NEBULIZATION EVERY 6 (SIX) HOURS AS NEEDED FOR WHEEZING 1080 mL 0  . albuterol  MDI, PROVENTIL , VENTOLIN , PROAIR , HFA 90 mcg/actuation inhaler Inhale 2 inhalations into the lungs every 4 (four) hours as needed 3 each 6  . aspirin  81 MG chewable tablet Take 81 mg by mouth once daily    . atorvastatin  (LIPITOR) 40 MG tablet Take 40 mg by mouth once daily    . buprenorphine (BUTRANS) 20 mcg/hour Place 1 patch onto the skin    . celecoxib (CELEBREX) 200 MG capsule once daily    . cetirizine (ZYRTEC) 10 MG tablet Take by mouth once daily    . clopidogreL  (PLAVIX ) 75 mg tablet Take 1 tablet (75 mg total) by mouth once daily 90 tablet 1  . docusate (COLACE) 100 MG capsule Take 100 mg by mouth 2 (two) times daily    . DULoxetine (CYMBALTA) 30 MG DR capsule Take 30 mg by mouth once daily    . ergocalciferol , vitamin D2, 1,250 mcg (50,000 unit) capsule  every 7 (seven) days    . ferrous sulfate 325 (65 FE) MG EC tablet Take 325 mg by mouth 2 (two) times daily with meals    . fluticasone  propionate (FLONASE ) 50 mcg/actuation nasal spray Place 2 sprays into both nostrils once daily    . FREESTYLE LIBRE 2 READER reader as directed    . FREESTYLE LIBRE 2 SENSOR kit as directed    . FUROsemide (LASIX) 20 MG tablet every other day    . gabapentin (NEURONTIN) 100 MG capsule TAKE ONE CAPSULE BY MOUTH TWICE DAILY FOR 7 DAYS, THEN TAKE TWO CAPSULES TWICE DAILY 120 capsule 3  . gabapentin (NEURONTIN) 300 MG capsule Take 1 capsule (300 mg total) by mouth 3 (three) times daily 90 capsule 1  . glimepiride (AMARYL) 4 MG tablet Take 4 mg by mouth daily with breakfast    . losartan  (COZAAR ) 25 MG tablet Take 25 mg by mouth once daily    . meloxicam (MOBIC) 7.5 MG tablet Take 7.5 mg by mouth once daily    . montelukast  (SINGULAIR ) 10 mg tablet TAKE ONE TABLET BY MOUTH AT BEDTIME 30 tablet 2  . nortriptyline  (PAMELOR ) 10 MG capsule TAKE 2 CAPSULES BY MOUTH NIGHTLY. CAN TAKE ADDITIONAL CAPSULE DURING THE DAY IF NEEDED 90 capsule 10  . nortriptyline  (PAMELOR ) 10 MG capsule Take 10 mg as  needed for headache (Patient taking differently: Take by mouth at bedtime Take 10 mg as needed for headache with an additional pill as needed) 30 capsule 2  . oxyCODONE  (OXYIR) 10 mg immediate release tablet Take 10 mg by mouth every 4 (four) hours as needed for Pain    . OZEMPIC 0.25 mg or 0.5 mg (2 mg/3 mL) pen injector Inject 1 mg subcutaneously once a week    . roflumilast  (DALIRESP ) 500 mcg tablet Take 1 tablet (0.5 mg total) by mouth once daily 90 tablet 1  . SYMBICORT 160-4.5 mcg/actuation inhaler Inhale 2 inhalations into the lungs 2 (two) times daily 10.2 g 6  . tiotropium (SPIRIVA  WITH HANDIHALER) 18 mcg inhalation capsule Place 1 capsule (18 mcg total) into inhaler and inhale once daily 90 capsule 3  . traZODone  (DESYREL ) 50 MG tablet Take 1-3 pills at night 90 tablet 1  .  umeclidinium (INCRUSE ELLIPTA ) 62.5 mcg/actuation DsDv inhalation unit Inhale 1 inhalation into the lungs once daily TO REPLACE SPIRIVA  DUE TO INSURANCE 1 each 5  . albuterol  MDI, PROVENTIL , VENTOLIN , PROAIR , HFA 90 mcg/actuation inhaler Inhale 2 inhalations into the lungs every 4 (four) hours as needed for Wheezing 3 each 3  . amLODIPine  (NORVASC ) 5 MG tablet Take 5 mg by mouth once daily (Patient not taking: Reported on 07/30/2023)    . atorvastatin  (LIPITOR) 20 MG tablet Take 40 mg by mouth once daily (Patient not taking: Reported on 07/30/2023)    . baclofen  (LIORESAL ) 10 MG tablet Take 1 tablet (10 mg total) by mouth 3 (three) times daily (Patient not taking: Reported on 07/30/2023) 90 tablet 0  . buprenorphine (BUTRANS) 5 mcg/hour PTWK Place onto the skin (Patient not taking: Reported on 07/30/2023)     No current facility-administered medications for this visit.    History: Past Medical History:  Diagnosis Date  . Asthma, unspecified asthma severity, unspecified whether complicated, unspecified whether persistent (HHS-HCC)   . COPD (chronic obstructive pulmonary disease) (CMS/HHS-HCC)   . Coronary artery disease 2019  . Diabetes mellitus without complication (CMS/HHS-HCC)   . GERD (gastroesophageal reflux disease) 2019  . History of cancer   . History of stroke   . Hypertension   . Obesity 2021   Gained weight from quitting smoking    Past Surgical History:  Procedure Laterality Date  . carapl tunnel release    . CARDIAC CATHETERIZATION      Family History  Adopted: Yes  Problem Relation Name Age of Onset  . Breast cancer Sister Amedeo CHRISTELLA Blush   . High blood pressure (Hypertension) Sister Amedeo CHRISTELLA Blush   . Breast cancer Paternal Grandmother    . Lung cancer Mother    . Myocardial Infarction (Heart attack) Brother Redell [place]   . High blood pressure (Hypertension) Brother Redell [place]   . High blood pressure (Hypertension) Brother Carlin [place]     Shx  reports that  she quit smoking about 4 years ago. Her smoking use included cigarettes. She started smoking about 47 years ago. She has a 64.5 pack-year smoking history. She has never used smokeless tobacco. She reports that she does not currently use drugs. She reports that she does not drink alcohol.   Review of Systems: As per above. All systems were reviewed with her in totality and there were no further positive findings. Presently no bleeding or bruising. No dysuria, hematuria, fever, chills, nor sweats. No lymph nodes, no rashes, TIAs, seizures, passing out spells, or suicidal ideation. SABRA  Physical Exam: BP 98/63   Pulse 85   Wt 79.7 kg (175 lb 12.8 oz)   SpO2 94% Comment: 3 L intermittent  BMI 32.15 kg/m  79.7 kg (175 lb 12.8 oz) 94% 88 % ON ROOM AIR General:  NAD. Able to speak in complete sentences without cough or dyspnea HEENT: Normocephalic, nontraumatic. Extraocular movements intact NECK: Supple. No JVD, nodes, thryomegaly CV: RRR no murmurs, gallops, rubs PULM: Normal respiratory effort, Clear to auscultation bilaterally without wheezing or crackles ABD: Increase girth EXTREMITIES: No significant edema, cyanosis or Homans'signs SKIN: Fair turgor. No rashes LYMPHATIC: No nodes NEURO: No gross deficits PSYCH: Appropriate affect, alert, oriented   1. 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 S/p xrt 2. No mediastinal or hilar metastatic adenopathy.  3. Much improved aeration in the right lung with resolving  infiltrates. Persistent nodularity in the anterior aspect medially  with mild hypermetabolism. Recommend close CT follow-up.  4. Small focus of hypermetabolism noted right adnexal region without  obvious mass. Pelvic MRI without and with contrast may be helpful  for further evaluation. No other findings for abdominal metastatic  disease.    Electronically Signed     By: MYRTIS Stammer M.D.    On: 12/08/2020 16:33   IMPRESSION:  1. Near complete resolution of previously demonstrated  hypermetabolic activity associated with the cavitary left lower lobe  lung mass. This mass is significantly smaller with metabolic  activity similar to blood pool in the adjacent aorta.  2. No evidence of metastatic disease.  No new pulmonary findings.  3. Increasingly hypermetabolic focus in the pelvis which appears to  be associated with the sigmoid colon, suspicious for a villous  adenoma or early colon cancer. Recommend colonoscopy if not recently  performed.  4. Coronary and aortic atherosclerosis (ICD10-I70.0). Emphysema  (ICD10-J43.9).   Electronically Signed    By: Elsie Perone M.D.    On: 05/27/2021 16:30   FINDINGS: chest ct scan Cardiovascular: Heart normal in size and configuration. No  pericardial effusion. Three-vessel coronary artery calcifications  great vessels are normal in caliber. Mild aortic atherosclerosis.   Mediastinum/Nodes: No neck base, mediastinal or hilar masses or  enlarged lymph nodes. Trachea and esophagus are unremarkable.   Lungs/Pleura: Stable band like opacity in the medial left lower  lobe, posterior to the inferior hilum, consistent with treatment  related scarring. Stable area discoid atelectasis in the right  middle lobe. Lungs show changes of advanced centrilobular emphysema  well as chronic peripheral interstitial thickening, the latter most  evident in the lower lungs.   No evidence of pneumonia or pulmonary edema. Lung mass or suspicious  nodule. No pleural effusion or pneumothorax.   Upper Abdomen: No acute findings.  No liver or adrenal masses.   Musculoskeletal: No fracture or acute finding. No bone lesion. No  chest wall mass.   IMPRESSION:  1. Stable appearance of the chest since the prior exam. No evidence  of recurrent lung carcinoma or metastatic disease.  2. No acute findings.   Aortic  Atherosclerosis (ICD10-I70.0) and Emphysema (ICD10-J43.9).    Electronically Signed    By: Alm Parkins M.D.    On: 03/15/2022 13:23      IMPRESSION: chest ct 1. Stable mild radiation fibrosis in the superior segment left lower  lobe. No evidence of local tumor recurrence.  2. No evidence of metastatic disease in the  chest.  3. Three-vessel coronary atherosclerosis.  4. Aortic Atherosclerosis (ICD10-I70.0) and Emphysema (ICD10-J43.9).   Electronically Signed    By: Selinda DELENA Blue M.D.    On: 09/25/2022 08:21    FINDINGS:  CT CHEST FINDINGS   Cardiovascular: Aortic atherosclerosis. Normal heart size.  Three-vessel coronary artery calcifications. No pericardial  effusion.   Mediastinum/Nodes: No enlarged mediastinal, hilar, or axillary lymph  nodes. Thyroid  gland, trachea, and esophagus demonstrate no  significant findings.   Lungs/Pleura: Severe emphysema. Diffuse bilateral bronchial wall  thickening. Unchanged post treatment appearance of the superior  segment left lower lobe, with bandlike scarring and volume loss  (series 4, image 79). No pleural effusion or pneumothorax.   Musculoskeletal: No chest wall abnormality. No acute osseous  findings.  1. Unchanged post treatment appearance of the superior segment left  lower lobe, with bandlike scarring and volume loss.  2. No evidence of lymphadenopathy or metastatic disease in the  chest, abdomen, or pelvis.  3. Previously described enhancing lesion in the sigmoid colon is not  appreciated on today's examination.  4. Severe emphysema and diffuse bilateral bronchial wall thickening.  5. Coronary artery disease.   Aortic Atherosclerosis (ICD10-I70.0) and Emphysema (ICD10-J43.9).    Electronically Signed    By: Marolyn JONETTA Jaksch M.D.    On: 04/07/2023 22:40    Impression: Very severe copd/asthma, (fev1 0.56 liters), MM phenotype. On oxygen. Off cigarettes since 4/21,  moderate AR. Cardiology following. No signs of failure.  Chronic dyspnea, has to pace and rest as she ambulates.  Poor endurance. She has a motorized wheel chair Continue continue symbicort, albuterol  and spiriva , daliresp  Following cardiology recs Advised to stay off the cigarettes F/U in 3 months    Irregular thick-walled cavitary mass in the left lower lobe, highly worrisome for primary bronchogenic carcinoma. Not a surgical candidate. Hence  S/p xrt emprically. Chest ct 1/25. ( Noted above). No recurrence or mets.   -following Dr. Tatiana rec. ppet scan 6/25   Rhinitis, mild Flonase , zytec    Hx of fatigue, multiple waking up spells, desats ? Underlying osa, ? Drug related sleep on oxygen. Sleep study in the lab still pending ( no transportation)   Chronic pain  following [facility] specialist recs

## 2023-08-09 ENCOUNTER — Ambulatory Visit: Payer: 59 | Admitting: Radiation Oncology

## 2023-10-08 ENCOUNTER — Ambulatory Visit: Admission: RE | Admit: 2023-10-08 | Payer: 59 | Source: Ambulatory Visit

## 2023-10-12 ENCOUNTER — Other Ambulatory Visit: Payer: Self-pay | Admitting: *Deleted

## 2023-10-12 DIAGNOSIS — D500 Iron deficiency anemia secondary to blood loss (chronic): Secondary | ICD-10-CM

## 2023-10-15 ENCOUNTER — Ambulatory Visit
Admission: RE | Admit: 2023-10-15 | Discharge: 2023-10-15 | Disposition: A | Discharge status: Home / Self Care | Source: Ambulatory Visit | Attending: Oncology | Admitting: Oncology

## 2023-10-15 ENCOUNTER — Ambulatory Visit: Payer: 59 | Admitting: Oncology

## 2023-10-15 ENCOUNTER — Inpatient Hospital Stay: Attending: Oncology

## 2023-10-15 DIAGNOSIS — R918 Other nonspecific abnormal finding of lung field: Secondary | ICD-10-CM | POA: Diagnosis not present

## 2023-10-15 DIAGNOSIS — D72829 Elevated white blood cell count, unspecified: Secondary | ICD-10-CM | POA: Diagnosis not present

## 2023-10-15 DIAGNOSIS — D509 Iron deficiency anemia, unspecified: Secondary | ICD-10-CM | POA: Diagnosis present

## 2023-10-15 DIAGNOSIS — D500 Iron deficiency anemia secondary to blood loss (chronic): Secondary | ICD-10-CM

## 2023-10-15 LAB — CBC WITH DIFFERENTIAL/PLATELET
Abs Immature Granulocytes: 0.06 K/uL (ref 0.00–0.07)
Basophils Absolute: 0 K/uL (ref 0.0–0.1)
Basophils Relative: 0 %
Eosinophils Absolute: 0.1 K/uL (ref 0.0–0.5)
Eosinophils Relative: 1 %
HCT: 40.8 % (ref 36.0–46.0)
Hemoglobin: 13.7 g/dL (ref 12.0–15.0)
Immature Granulocytes: 1 %
Lymphocytes Relative: 15 %
Lymphs Abs: 1.6 K/uL (ref 0.7–4.0)
MCH: 29.1 pg (ref 26.0–34.0)
MCHC: 33.6 g/dL (ref 30.0–36.0)
MCV: 86.6 fL (ref 80.0–100.0)
Monocytes Absolute: 0.7 K/uL (ref 0.1–1.0)
Monocytes Relative: 7 %
Neutro Abs: 8.1 K/uL — ABNORMAL HIGH (ref 1.7–7.7)
Neutrophils Relative %: 76 %
Platelets: 265 K/uL (ref 150–400)
RBC: 4.71 MIL/uL (ref 3.87–5.11)
RDW: 13.4 % (ref 11.5–15.5)
WBC: 10.6 K/uL — ABNORMAL HIGH (ref 4.0–10.5)
nRBC: 0 % (ref 0.0–0.2)

## 2023-10-15 LAB — IRON AND TIBC
Iron: 40 ug/dL (ref 28–170)
Saturation Ratios: 14 % (ref 10.4–31.8)
TIBC: 281 ug/dL (ref 250–450)
UIBC: 241 ug/dL

## 2023-10-15 LAB — FERRITIN: Ferritin: 183 ng/mL (ref 11–307)

## 2023-10-15 LAB — POCT I-STAT CREATININE: Creatinine, Ser: 0.7 mg/dL (ref 0.44–1.00)

## 2023-10-15 MED ORDER — IOHEXOL 300 MG/ML  SOLN
100.0000 mL | Freq: Once | INTRAMUSCULAR | Status: AC | PRN
  Administered 2023-10-15: 100 mL via INTRAVENOUS

## 2023-10-22 ENCOUNTER — Telehealth: Payer: Self-pay | Admitting: *Deleted

## 2023-10-22 NOTE — Telephone Encounter (Signed)
 The patient's brother is in the hospice and right on the edge of past seeing and she did not want to be over at the hospital here at the cancer center and him being over there and he passed away with not her being there.  I changed his to the same day same time but a video visit and she has done it on her phone several times so she can do it.

## 2023-10-23 ENCOUNTER — Inpatient Hospital Stay (HOSPITAL_BASED_OUTPATIENT_CLINIC_OR_DEPARTMENT_OTHER): Admitting: Oncology

## 2023-10-23 ENCOUNTER — Telehealth: Payer: Self-pay

## 2023-10-23 DIAGNOSIS — D72829 Elevated white blood cell count, unspecified: Secondary | ICD-10-CM | POA: Diagnosis not present

## 2023-10-23 DIAGNOSIS — K639 Disease of intestine, unspecified: Secondary | ICD-10-CM | POA: Diagnosis not present

## 2023-10-23 DIAGNOSIS — R918 Other nonspecific abnormal finding of lung field: Secondary | ICD-10-CM | POA: Diagnosis not present

## 2023-10-23 DIAGNOSIS — Z862 Personal history of diseases of the blood and blood-forming organs and certain disorders involving the immune mechanism: Secondary | ICD-10-CM

## 2023-10-23 NOTE — Telephone Encounter (Signed)
 Tried to call patient in regards to her MyChart visit with Dr. Georgina today, but no answer

## 2023-10-23 NOTE — Progress Notes (Unsigned)
 [facility]  [phone] [phone]  ID: Stephanie Ross OB: 1966-02-04  MR#: [id number]  RDW#:[id number]  Patient Care Team: Katrinka Aquas, MD as PCP - General (Internal Medicine) Verdene Gills, RN as Oncology Nurse Navigator Jacobo, Evalene PARAS, MD as Consulting Physician (Oncology) Lenn Aran, MD as Consulting Physician (Radiation Oncology)  I connected with Stephanie Ross on 10/24/23 at  2:15 PM EDT by video enabled telemedicine visit and verified that I am speaking with the correct person using two identifiers.   I discussed the limitations, risks, security and privacy concerns of performing an evaluation and management service by telemedicine and the availability of in-person appointments. I also discussed with the patient that there may be a patient responsible charge related to this service. The patient expressed understanding and agreed to proceed.   Other persons participating in the visit and their role in the encounter: Patient, MD.  Patient's location: Home. Provider's location: Clinic.  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  INTERVAL HISTORY: Patient agreed to video-assisted telemedicine visit for further evaluation and discussion of her imaging and laboratory results.  She is currently with her brother who is on hospice.  She continues to have chronic shortness of breath and requires oxygen 24 hours a day, but otherwise feels well.  She does not complain of any weakness or fatigue today.  She has no neurologic complaints.  She denies any recent fevers.  She has a fair appetite, but denies weight loss.  She denies any chest pain, cough, or hemoptysis.  She has no nausea, vomiting, constipation, or diarrhea.  She denies any melena or hematochezia.  She has no urinary complaints.  Patient offers no further specific complaints today.  REVIEW OF SYSTEMS:   Review of Systems   Constitutional: Negative.  Negative for fever, malaise/fatigue and weight loss.  Respiratory:  Positive for shortness of breath. Negative for cough and hemoptysis.   Cardiovascular: Negative.  Negative for chest pain and leg swelling.  Gastrointestinal: Negative.  Negative for abdominal pain, blood in stool and melena.  Genitourinary: Negative.  Negative for dysuria.  Musculoskeletal: Negative.  Negative for back pain.  Skin: Negative.  Negative for rash.  Neurological: Negative.  Negative for dizziness, focal weakness, weakness and headaches.  Psychiatric/Behavioral: Negative.  The patient is not nervous/anxious.     As per HPI. Otherwise, a complete review of systems is negative.  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    PAST SURGICAL HISTORY: Past Surgical History:  Procedure Laterality Date   BILATERAL CARPAL TUNNEL RELEASE     CERVICAL ABLATION      FAMILY HISTORY: Family History  Problem Relation Age of Onset   Colon polyps Father    Breast cancer Sister    Colon polyps  Sister    Breast cancer Paternal Grandmother    Colon cancer Neg Hx     ADVANCED DIRECTIVES (Y/N):  N  HEALTH MAINTENANCE: Social History   Tobacco Use   Smoking status: Former    Current packs/day: 0.00    Types: Cigarettes    Quit date: 07/17/2019    Years since quitting: 4.2   Smokeless tobacco: Never  Vaping Use   Vaping status: Former   Quit date:  06/26/2019  Substance Use Topics   Alcohol use: No   Drug use: Not Currently    Types: Marijuana     Colonoscopy:  PAP:  Bone density:  Lipid panel:  Allergies  Allergen Reactions   Amoxicillin Other (See Comments)    Has patient had a PCN reaction causing immediate rash, facial/tongue/throat swelling, SOB or lightheadedness with hypotension: Yes Has patient had a PCN reaction causing severe rash involving mucus membranes or skin necrosis: Yes Has patient had a PCN reaction that required hospitalization:yes Has patient had a PCN reaction occurring within the last 10 years: yes If all of the above answers are NO, then may proceed with Cephalosporin use.    Prednisone Other (See Comments)    Full body rash    Doxycycline  Other (See Comments)    Other reaction(s): Other (See Comments) caused fluid in lungs caused fluid in lungs    Metoprolol     Other reaction(s): Dizziness   Other Other (See Comments)    Tide detergent - rash   Umeclidinium Other (See Comments)    Fluid on lungs    Current Outpatient Medications  Medication Sig Dispense Refill   Accu-Chek Softclix Lancets lancets See admin instructions.     albuterol  (PROVENTIL ) (2.5 MG/3ML) 0.083% nebulizer solution Take 2.5 mg by nebulization every 6 (six) hours as needed for wheezing or shortness of breath.     albuterol  (VENTOLIN  HFA) 108 (90 Base) MCG/ACT inhaler Inhale 2 puffs into the lungs every 4 (four) hours as needed for wheezing or shortness of breath. 18 g 3   atorvastatin  (LIPITOR) 40 MG tablet Take 40 mg by mouth daily.     baclofen  (LIORESAL ) 10 MG tablet Take 10 mg by mouth 3 (three) times daily as needed for muscle spasms. (Patient not taking: Reported on 02/07/2022)     budesonide-formoterol  (SYMBICORT) 160-4.5 MCG/ACT inhaler Inhale 2 puffs into the lungs 2 (two) times daily.     cetirizine (ZYRTEC) 10 MG tablet Take 10 mg by mouth daily.     clopidogrel  (PLAVIX ) 75 MG tablet Take 75 mg by mouth  daily.     Coenzyme Q10 (COQ10) 100 MG CAPS Take 100 mg by mouth daily.      DALIRESP  500 MCG TABS tablet Take 500 mcg by mouth daily.     ergocalciferol  (VITAMIN D2) 1.25 MG (50000 UT) capsule Take 50,000 Units by mouth every Monday.     Ferrous Sulfate (IRON  PO) Take 1 tablet by mouth in the morning and at bedtime.     gabapentin (NEURONTIN) 100 MG capsule Take 100 mg twice a day for one week, then increase to 200 mg(2 tablets) twice a day and continue (Patient not taking: Reported on 04/09/2023)     glimepiride (AMARYL) 2 MG tablet Take 6 mg by mouth every morning.     insulin  detemir (LEVEMIR FLEXPEN) 100 UNIT/ML FlexPen 10 units at dinner time Subcutaneous for 30 days (Patient not taking: Reported on 04/09/2023)     losartan  (COZAAR ) 25 MG tablet Take 25  mg by mouth daily.     meloxicam (MOBIC) 7.5 MG tablet TAKE 1 TABLET BY MOUTH ONCE DAILY. for 30     montelukast  (SINGULAIR ) 10 MG tablet Take 10 mg by mouth at bedtime.     nortriptyline  (PAMELOR ) 10 MG capsule Take 10 mg as needed for headache     ondansetron  (ZOFRAN -ODT) 4 MG disintegrating tablet Take 1 tablet (4 mg total) by mouth every 8 (eight) hours as needed for nausea. 10 tablet 0   Oxycodone  HCl 10 MG TABS Take 10 mg by mouth every 4 (four) hours.     OZEMPIC, 0.25 OR 0.5 MG/DOSE, 2 MG/3ML SOPN Inject 0.5 mg into the skin once a week.     umeclidinium bromide  (INCRUSE ELLIPTA ) 62.5 MCG/ACT AEPB Inhale 1 puff into the lungs daily. 30 each 2   No current facility-administered medications for this visit.    OBJECTIVE: There were no vitals filed for this visit.    There is no height or weight on file to calculate BMI.    ECOG FS:1 - Symptomatic but completely ambulatory  General: Well-developed, well-nourished, no acute distress. HEENT: Normocephalic. Neuro: Alert, answering all questions appropriately. Cranial nerves grossly intact. Psych: Normal affect.   LAB RESULTS:  Lab Results  Component Value Date   NA 127 (L)  12/30/2022   K 3.5 12/30/2022   CL 91 (L) 12/30/2022   CO2 25 12/30/2022   GLUCOSE 127 (H) 12/30/2022   BUN 10 12/30/2022   CREATININE 0.70 10/15/2023   CALCIUM  9.3 12/30/2022   PROT 7.8 12/30/2022   ALBUMIN 4.1 12/30/2022   AST 30 12/30/2022   ALT 64 (H) 12/30/2022   ALKPHOS 77 12/30/2022   BILITOT 1.4 (H) 12/30/2022   GFRNONAA >60 12/30/2022   GFRAA >60 10/15/2019    Lab Results  Component Value Date   WBC 10.6 (H) 10/15/2023   NEUTROABS 8.1 (H) 10/15/2023   HGB 13.7 10/15/2023   HCT 40.8 10/15/2023   MCV 86.6 10/15/2023   PLT 265 10/15/2023   Lab Results  Component Value Date   IRON  40 10/15/2023   TIBC 281 10/15/2023   IRONPCTSAT 14 10/15/2023   Lab Results  Component Value Date   FERRITIN 183 10/15/2023     STUDIES: CT Chest W Contrast Result Date: 10/15/2023 EXAM: CT CHEST WITH CONTRAST 10/15/2023 01:55:19 PM TECHNIQUE: CT of the chest was performed with the administration of intravenous contrast. Multiplanar reformatted images are provided for review. Automated exposure control, iterative reconstruction, and/or weight based adjustment of the mA/kV was utilized to reduce the radiation dose to as low as reasonably achievable. COMPARISON: 04/04/2023 CLINICAL HISTORY: Lung mass, status post radiation therapy 2021. FINDINGS: MEDIASTINUM: Heart and pericardium are unremarkable. The central airways are clear. Moderate 3-vessel coronary atherosclerosis. Thoracic aortic atherosclerosis. LYMPH NODES: No mediastinal, hilar or axillary lymphadenopathy. LUNGS AND PLEURA: No focal consolidation or pulmonary edema. No pleural effusion or pneumothorax. Moderate centrilobular and paraseptal emphysematous changes, upper lung predominant. Stable mild scarring/radiation changes in the medial left lower lobe. Stable mild linear scarring inferiorly in the right middle lobe. SOFT TISSUES/BONES: No acute abnormality of the bones or soft tissues. Mild degenerative changes of the visualized  thoracolumbar spine. UPPER ABDOMEN: Limited images of the upper abdomen demonstrates no acute abnormality. IMPRESSION: 1. Stable mild scarring/radiation changes in the medial left lower lobe. 2. No recurrent or metastatic disease. Electronically signed by: Pinkie Pebbles MD 10/15/2023 11:14 PM EDT RP Workstation: [id number]    ASSESSMENT: FDG avid left lower lobe  lung mass.  PLAN:    FDG avid left lower lobe lung mass: Highly suspicious for underlying malignancy.  PET scan results from December 08, 2020 reviewed independently with a 3.5 cm hypermetabolic mass with no mediastinal or hilar adenopathy noted.  Given patient's underlying pulmonary and cardiac disease, she was not a surgical candidate and was unable to undergo EBUS/bronchoscopy for possible biopsy and lymph node sampling secondary to high risk with anesthesia.  Patient proceeded directly to XRT which she completed in approximately December 2022.  Her most recent imaging with CT scan on October 15, 2023 reviewed independently and reported as above with no obvious evidence of recurrent or progressive disease.  Patient is now over 2 years removed from completing her treatment and can be transition to a yearly imaging and evaluation. PET positive sigmoid colon lesion: Given suspicious lesion as well as progressive anemia, concern was for a second primary.  Patient has declined colonoscopy or any surgical intervention.  Repeat CT scan did not reveal any suspicious pathology in her abdomen. History of iron  deficiency anemia: Resolved.  Patient's hemoglobin and iron  stores continue to be within normal limits. Leukocytosis: Likely reactive, monitor.  I provided 20 minutes of face-to-face video visit time during this encounter which included chart review, counseling, and coordination of care as documented above.  Patient expressed understanding and was in agreement with this plan. She also understands that She can call clinic at any time with any  questions, concerns, or complaints.    Cancer Staging  No matching staging information was found for the patient.   Evalene JINNY Reusing, MD   10/24/2023 8:54 PM

## 2023-10-24 ENCOUNTER — Encounter: Payer: Self-pay | Admitting: Oncology

## 2024-01-25 ENCOUNTER — Encounter: Payer: Self-pay | Admitting: Oncology

## 2024-10-21 ENCOUNTER — Other Ambulatory Visit

## 2024-10-29 ENCOUNTER — Telehealth: Admitting: Oncology
